# Patient Record
Sex: Male | Born: 1965 | Race: Black or African American | Hispanic: No | Marital: Single | Smoking: Never smoker
Health system: Southern US, Community
[De-identification: ages and names within clinical notes are randomized; demographics above are authoritative.]

## PROBLEM LIST (undated history)

## (undated) DIAGNOSIS — K2970 Gastritis, unspecified, without bleeding: Secondary | ICD-10-CM

## (undated) DIAGNOSIS — E119 Type 2 diabetes mellitus without complications: Secondary | ICD-10-CM

## (undated) DIAGNOSIS — R112 Nausea with vomiting, unspecified: Secondary | ICD-10-CM

## (undated) DIAGNOSIS — K922 Gastrointestinal hemorrhage, unspecified: Secondary | ICD-10-CM

## (undated) DIAGNOSIS — F1290 Cannabis use, unspecified, uncomplicated: Secondary | ICD-10-CM

## (undated) DIAGNOSIS — N179 Acute kidney failure, unspecified: Secondary | ICD-10-CM

## (undated) DIAGNOSIS — E876 Hypokalemia: Secondary | ICD-10-CM

## (undated) DIAGNOSIS — S1121XA Laceration without foreign body of pharynx and cervical esophagus, initial encounter: Secondary | ICD-10-CM

## (undated) DIAGNOSIS — D500 Iron deficiency anemia secondary to blood loss (chronic): Secondary | ICD-10-CM

## (undated) DIAGNOSIS — K31A0 Gastric intestinal metaplasia, unspecified: Secondary | ICD-10-CM

## (undated) DIAGNOSIS — K259 Gastric ulcer, unspecified as acute or chronic, without hemorrhage or perforation: Secondary | ICD-10-CM

## (undated) DIAGNOSIS — K3184 Gastroparesis: Secondary | ICD-10-CM

## (undated) DIAGNOSIS — F12988 Cannabis use, unspecified with other cannabis-induced disorder: Secondary | ICD-10-CM

## (undated) DIAGNOSIS — A048 Other specified bacterial intestinal infections: Secondary | ICD-10-CM

## (undated) DIAGNOSIS — K3189 Other diseases of stomach and duodenum: Secondary | ICD-10-CM

## (undated) DIAGNOSIS — E440 Moderate protein-calorie malnutrition: Secondary | ICD-10-CM

## (undated) DIAGNOSIS — Z5189 Encounter for other specified aftercare: Secondary | ICD-10-CM

## (undated) HISTORY — DX: Cannabis use, unspecified with other cannabis-induced disorder: F12.988

## (undated) HISTORY — DX: Gastric intestinal metaplasia, unspecified: K31.A0

## (undated) HISTORY — DX: Gastritis, unspecified, without bleeding: K29.70

## (undated) HISTORY — DX: Gastrointestinal hemorrhage, unspecified: K92.2

## (undated) HISTORY — DX: Encounter for other specified aftercare: Z51.89

## (undated) HISTORY — DX: Other specified bacterial intestinal infections: A04.8

## (undated) HISTORY — DX: Nausea with vomiting, unspecified: R11.2

## (undated) HISTORY — DX: Gastric ulcer, unspecified as acute or chronic, without hemorrhage or perforation: K25.9

## (undated) HISTORY — DX: Iron deficiency anemia secondary to blood loss (chronic): D50.0

## (undated) HISTORY — DX: Other diseases of stomach and duodenum: K31.89

## (undated) HISTORY — DX: Moderate protein-calorie malnutrition: E44.0

## (undated) HISTORY — DX: Hypokalemia: E87.6

## (undated) HISTORY — DX: Cannabis use, unspecified, uncomplicated: F12.90

---

## 2007-05-02 ENCOUNTER — Emergency Department (HOSPITAL_COMMUNITY): Admission: EM | Admit: 2007-05-02 | Discharge: 2007-05-02 | Payer: Self-pay | Admitting: Emergency Medicine

## 2008-12-19 ENCOUNTER — Emergency Department (HOSPITAL_COMMUNITY): Admission: EM | Admit: 2008-12-19 | Discharge: 2008-12-19 | Payer: Self-pay | Admitting: Emergency Medicine

## 2009-03-28 ENCOUNTER — Emergency Department (HOSPITAL_COMMUNITY): Admission: EM | Admit: 2009-03-28 | Discharge: 2009-03-28 | Payer: Self-pay | Admitting: Emergency Medicine

## 2009-09-18 ENCOUNTER — Emergency Department (HOSPITAL_COMMUNITY): Admission: EM | Admit: 2009-09-18 | Discharge: 2009-09-18 | Payer: Self-pay | Admitting: Emergency Medicine

## 2009-11-25 ENCOUNTER — Emergency Department (HOSPITAL_COMMUNITY): Admission: EM | Admit: 2009-11-25 | Discharge: 2009-11-26 | Payer: Self-pay | Admitting: Emergency Medicine

## 2010-08-22 ENCOUNTER — Emergency Department (HOSPITAL_COMMUNITY)
Admission: EM | Admit: 2010-08-22 | Discharge: 2010-08-22 | Payer: Self-pay | Source: Home / Self Care | Admitting: Emergency Medicine

## 2010-11-22 LAB — COMPREHENSIVE METABOLIC PANEL
Alkaline Phosphatase: 59 U/L (ref 39–117)
BUN: 7 mg/dL (ref 6–23)
Calcium: 9.2 mg/dL (ref 8.4–10.5)
Chloride: 106 mEq/L (ref 96–112)
Creatinine, Ser: 0.72 mg/dL (ref 0.4–1.5)
GFR calc Af Amer: >60 mL/min (ref >60)
GFR calc non Af Amer: >60 mL/min (ref >60)
Glucose, Bld: 119 mg/dL — ABNORMAL HIGH (ref 70–99)
Potassium: 3.7 mEq/L (ref 3.5–5.1)
Total Bilirubin: 0.4 mg/dL (ref 0.3–1.2)
Total Protein: 5.7 g/dL — ABNORMAL LOW (ref 6.0–8.3)

## 2010-11-22 LAB — LIPASE, BLOOD: Lipase: 17 U/L (ref 11–59)

## 2010-11-22 LAB — DIFFERENTIAL
Basophils Absolute: 0.1 10*3/uL (ref 0.0–0.1)
Basophils Relative: 1 % (ref 0–1)
Eosinophils Relative: 0 % (ref 0–5)
Neutro Abs: 7.4 10*3/uL (ref 1.7–7.7)
Neutrophils Relative %: 79 % — ABNORMAL HIGH (ref 43–77)

## 2010-11-22 LAB — CBC
MCV: 95.7 fL (ref 78.0–100.0)
RDW: 13.4 % (ref 11.5–15.5)

## 2010-11-29 ENCOUNTER — Emergency Department (HOSPITAL_COMMUNITY)
Admission: EM | Admit: 2010-11-29 | Discharge: 2010-11-29 | Disposition: A | Discharge status: Home / Self Care | Payer: BC Managed Care – PPO | Attending: Emergency Medicine | Admitting: Emergency Medicine

## 2010-11-29 DIAGNOSIS — R197 Diarrhea, unspecified: Secondary | ICD-10-CM | POA: Insufficient documentation

## 2010-11-29 DIAGNOSIS — R1110 Vomiting, unspecified: Secondary | ICD-10-CM | POA: Insufficient documentation

## 2010-11-29 DIAGNOSIS — R109 Unspecified abdominal pain: Secondary | ICD-10-CM | POA: Insufficient documentation

## 2010-11-29 DIAGNOSIS — K5289 Other specified noninfective gastroenteritis and colitis: Secondary | ICD-10-CM | POA: Insufficient documentation

## 2010-11-29 LAB — DIFFERENTIAL
Basophils Absolute: 0 10*3/uL (ref 0.0–0.1)
Lymphs Abs: 1.3 10*3/uL (ref 0.7–4.0)
Monocytes Relative: 4 % (ref 3–12)
Neutro Abs: 8 10*3/uL — ABNORMAL HIGH (ref 1.7–7.7)
Neutrophils Relative %: 82 % — ABNORMAL HIGH (ref 43–77)

## 2010-11-29 LAB — COMPREHENSIVE METABOLIC PANEL
ALT: 31 U/L (ref 0–53)
AST: 19 U/L (ref 0–37)
Albumin: 4.3 g/dL (ref 3.5–5.2)
Alkaline Phosphatase: 66 U/L (ref 39–117)
CO2: 20 mEq/L (ref 19–32)
Creatinine, Ser: 1.27 mg/dL (ref 0.4–1.5)
GFR calc Af Amer: >60 mL/min (ref >60)
GFR calc non Af Amer: >60 mL/min (ref >60)
Potassium: 3 mEq/L — ABNORMAL LOW (ref 3.5–5.1)
Total Bilirubin: 1.1 mg/dL (ref 0.3–1.2)
Total Protein: 8.1 g/dL (ref 6.0–8.3)

## 2010-11-29 LAB — POCT I-STAT, CHEM 8
BUN: 13 mg/dL (ref 6–23)
Calcium, Ion: 1.08 mmol/L — ABNORMAL LOW (ref 1.12–1.32)
Creatinine, Ser: 0.7 mg/dL (ref 0.4–1.5)
Glucose, Bld: 143 mg/dL — ABNORMAL HIGH (ref 70–99)
HCT: 44 % (ref 39.0–52.0)
Sodium: 142 mEq/L (ref 135–145)
TCO2: 21 mmol/L (ref 0–100)

## 2010-11-29 LAB — CBC
Hemoglobin: 14.2 g/dL (ref 13.0–17.0)
MCV: 94.7 fL (ref 78.0–100.0)
RBC: 4.55 MIL/uL (ref 4.22–5.81)
WBC: 9.7 10*3/uL (ref 4.0–10.5)

## 2010-12-09 ENCOUNTER — Emergency Department (HOSPITAL_COMMUNITY)
Admission: EM | Admit: 2010-12-09 | Discharge: 2010-12-09 | Disposition: A | Discharge status: Home / Self Care | Payer: BC Managed Care – PPO | Attending: Emergency Medicine | Admitting: Emergency Medicine

## 2010-12-09 DIAGNOSIS — R112 Nausea with vomiting, unspecified: Secondary | ICD-10-CM | POA: Insufficient documentation

## 2010-12-09 DIAGNOSIS — R1013 Epigastric pain: Secondary | ICD-10-CM | POA: Insufficient documentation

## 2010-12-09 LAB — CBC
HCT: 37.5 % — ABNORMAL LOW (ref 39.0–52.0)
RBC: 4.33 MIL/uL (ref 4.22–5.81)
RDW: 12 % (ref 11.5–15.5)
WBC: 7.6 10*3/uL (ref 4.0–10.5)

## 2010-12-09 LAB — DIFFERENTIAL
Basophils Absolute: 0 10*3/uL (ref 0.0–0.1)
Basophils Relative: 0 % (ref 0–1)
Eosinophils Absolute: 0 10*3/uL (ref 0.0–0.7)
Eosinophils Relative: 0 % (ref 0–5)
Lymphocytes Relative: 16 % (ref 12–46)
Lymphs Abs: 1.3 10*3/uL (ref 0.7–4.0)
Monocytes Absolute: 0.4 10*3/uL (ref 0.1–1.0)
Monocytes Relative: 5 % (ref 3–12)

## 2010-12-09 LAB — COMPREHENSIVE METABOLIC PANEL
AST: 26 U/L (ref 0–37)
Albumin: 4.5 g/dL (ref 3.5–5.2)
Alkaline Phosphatase: 59 U/L (ref 39–117)
CO2: 22 mEq/L (ref 19–32)
Calcium: 9 mg/dL (ref 8.4–10.5)
Chloride: 106 mEq/L (ref 96–112)
Glucose, Bld: 128 mg/dL — ABNORMAL HIGH (ref 70–99)
Potassium: 3.5 mEq/L (ref 3.5–5.1)
Total Bilirubin: 1 mg/dL (ref 0.3–1.2)

## 2010-12-13 LAB — CBC: MCV: 94.4 fL (ref 78.0–100.0)

## 2010-12-13 LAB — URINALYSIS, ROUTINE W REFLEX MICROSCOPIC
Bilirubin Urine: NEGATIVE
Glucose, UA: NEGATIVE mg/dL
Ketones, ur: 40 mg/dL — AB
Protein, ur: NEGATIVE mg/dL

## 2010-12-13 LAB — COMPREHENSIVE METABOLIC PANEL
AST: 31 U/L (ref 0–37)
Albumin: 4.9 g/dL (ref 3.5–5.2)
Calcium: 9.8 mg/dL (ref 8.4–10.5)
GFR calc Af Amer: >60 mL/min (ref >60)
Potassium: 3.5 mEq/L (ref 3.5–5.1)
Sodium: 141 mEq/L (ref 135–145)
Total Bilirubin: 0.8 mg/dL (ref 0.3–1.2)
Total Protein: 8.8 g/dL — ABNORMAL HIGH (ref 6.0–8.3)

## 2010-12-13 LAB — DIFFERENTIAL
Basophils Absolute: 0 10*3/uL (ref 0.0–0.1)
Basophils Relative: 0 % (ref 0–1)
Lymphocytes Relative: 16 % (ref 12–46)
Lymphs Abs: 1.7 10*3/uL (ref 0.7–4.0)
Neutro Abs: 8.4 10*3/uL — ABNORMAL HIGH (ref 1.7–7.7)
Neutrophils Relative %: 79 % — ABNORMAL HIGH (ref 43–77)

## 2010-12-13 LAB — LACTIC ACID, PLASMA: Lactic Acid, Venous: 2.2 mmol/L (ref 0.5–2.2)

## 2010-12-16 LAB — COMPREHENSIVE METABOLIC PANEL
ALT: 18 U/L (ref 0–53)
AST: 26 U/L (ref 0–37)
CO2: 24 mEq/L (ref 19–32)
Calcium: 9.6 mg/dL (ref 8.4–10.5)
Chloride: 105 mEq/L (ref 96–112)
GFR calc non Af Amer: >60 mL/min (ref >60)
Glucose, Bld: 144 mg/dL — ABNORMAL HIGH (ref 70–99)
Sodium: 139 mEq/L (ref 135–145)
Total Bilirubin: 0.6 mg/dL (ref 0.3–1.2)

## 2010-12-16 LAB — LIPASE, BLOOD: Lipase: 15 U/L (ref 11–59)

## 2011-02-20 ENCOUNTER — Emergency Department (HOSPITAL_COMMUNITY)
Admission: EM | Admit: 2011-02-20 | Discharge: 2011-02-20 | Disposition: A | Discharge status: Home / Self Care | Payer: BC Managed Care – PPO | Attending: Emergency Medicine | Admitting: Emergency Medicine

## 2011-02-20 DIAGNOSIS — R1013 Epigastric pain: Secondary | ICD-10-CM | POA: Insufficient documentation

## 2011-02-20 DIAGNOSIS — R112 Nausea with vomiting, unspecified: Secondary | ICD-10-CM | POA: Insufficient documentation

## 2011-02-20 DIAGNOSIS — R630 Anorexia: Secondary | ICD-10-CM | POA: Insufficient documentation

## 2011-02-20 LAB — POCT I-STAT, CHEM 8
BUN: 12 mg/dL (ref 6–23)
Calcium, Ion: 0.77 mmol/L — ABNORMAL LOW (ref 1.12–1.32)
Chloride: 112 mEq/L (ref 96–112)
Creatinine, Ser: 0.6 mg/dL (ref 0.50–1.35)
TCO2: 17 mmol/L (ref 0–100)

## 2011-02-20 LAB — URINALYSIS, ROUTINE W REFLEX MICROSCOPIC
Glucose, UA: NEGATIVE mg/dL
Ketones, ur: NEGATIVE mg/dL
Leukocytes, UA: NEGATIVE
pH: 5.5 (ref 5.0–8.0)

## 2011-02-20 LAB — URINE MICROSCOPIC-ADD ON

## 2011-02-20 LAB — DIFFERENTIAL
Basophils Absolute: 0 10*3/uL (ref 0.0–0.1)
Basophils Relative: 0 % (ref 0–1)
Eosinophils Absolute: 0 10*3/uL (ref 0.0–0.7)
Eosinophils Relative: 0 % (ref 0–5)
Lymphocytes Relative: 16 % (ref 12–46)
Monocytes Absolute: 0.5 10*3/uL (ref 0.1–1.0)

## 2011-02-20 LAB — COMPREHENSIVE METABOLIC PANEL
ALT: 35 U/L (ref 0–53)
AST: 27 U/L (ref 0–37)
Albumin: 4.9 g/dL (ref 3.5–5.2)
Alkaline Phosphatase: 72 U/L (ref 39–117)
BUN: 12 mg/dL (ref 6–23)
Chloride: 98 mEq/L (ref 96–112)
Potassium: 6.2 mEq/L — ABNORMAL HIGH (ref 3.5–5.1)
Sodium: 134 mEq/L — ABNORMAL LOW (ref 135–145)
Total Bilirubin: 0.3 mg/dL (ref 0.3–1.2)
Total Protein: 9.1 g/dL — ABNORMAL HIGH (ref 6.0–8.3)

## 2011-02-20 LAB — CBC
HCT: 39.5 % (ref 39.0–52.0)
MCHC: 35.9 g/dL (ref 30.0–36.0)
Platelets: 266 10*3/uL (ref 150–400)
RDW: 13 % (ref 11.5–15.5)
WBC: 11.9 10*3/uL — ABNORMAL HIGH (ref 4.0–10.5)

## 2011-02-20 LAB — LIPASE, BLOOD: Lipase: 18 U/L (ref 11–59)

## 2011-06-18 LAB — COMPREHENSIVE METABOLIC PANEL WITH GFR
ALT: 25
AST: 31
Albumin: 4.9
CO2: 25
Chloride: 101
GFR calc Af Amer: >60
GFR calc non Af Amer: >60
Potassium: 4.6
Sodium: 137
Total Bilirubin: 1.4 — ABNORMAL HIGH

## 2011-06-18 LAB — DIFFERENTIAL
Basophils Absolute: 0
Basophils Relative: 0
Eosinophils Absolute: 0
Eosinophils Relative: 0
Lymphocytes Relative: 14
Lymphs Abs: 1.3
Monocytes Absolute: 0.5
Monocytes Relative: 5
Neutro Abs: 7.2
Neutrophils Relative %: 81 — ABNORMAL HIGH

## 2011-06-18 LAB — URINALYSIS, ROUTINE W REFLEX MICROSCOPIC
Bilirubin Urine: NEGATIVE
Glucose, UA: NEGATIVE
Hgb urine dipstick: NEGATIVE
Ketones, ur: 15 — AB
Nitrite: NEGATIVE
Protein, ur: 30 — AB
Specific Gravity, Urine: 1.028
Urobilinogen, UA: 0.2
pH: 5.5

## 2011-06-18 LAB — URINE MICROSCOPIC-ADD ON

## 2011-06-18 LAB — CBC
HCT: 45.1
Hemoglobin: 15.3
MCHC: 33.9
MCV: 95.9
Platelets: 261
RBC: 4.7
RDW: 12.6
WBC: 8.9

## 2011-06-18 LAB — COMPREHENSIVE METABOLIC PANEL
Alkaline Phosphatase: 66
BUN: 31 — ABNORMAL HIGH
Calcium: 9.4
Creatinine, Ser: 1.06
Glucose, Bld: 112 — ABNORMAL HIGH
Total Protein: 8.5 — ABNORMAL HIGH

## 2011-06-18 LAB — LIPASE, BLOOD: Lipase: 16

## 2011-07-06 ENCOUNTER — Emergency Department (HOSPITAL_COMMUNITY)
Admission: EM | Admit: 2011-07-06 | Discharge: 2011-07-06 | Disposition: A | Discharge status: Home / Self Care | Payer: Self-pay | Attending: Emergency Medicine | Admitting: Emergency Medicine

## 2011-07-06 DIAGNOSIS — R630 Anorexia: Secondary | ICD-10-CM | POA: Insufficient documentation

## 2011-07-06 DIAGNOSIS — R112 Nausea with vomiting, unspecified: Secondary | ICD-10-CM | POA: Insufficient documentation

## 2011-07-06 LAB — DIFFERENTIAL
Basophils Relative: 0 % (ref 0–1)
Monocytes Relative: 6 % (ref 3–12)
Neutro Abs: 6.2 10*3/uL (ref 1.7–7.7)
Neutrophils Relative %: 74 % (ref 43–77)

## 2011-07-06 LAB — COMPREHENSIVE METABOLIC PANEL
ALT: 12 U/L (ref 0–53)
Alkaline Phosphatase: 78 U/L (ref 39–117)
CO2: 22 mEq/L (ref 19–32)
Calcium: 10.2 mg/dL (ref 8.4–10.5)
GFR calc Af Amer: >90 mL/min (ref >90)
GFR calc non Af Amer: >90 mL/min (ref >90)
Glucose, Bld: 106 mg/dL — ABNORMAL HIGH (ref 70–99)
Potassium: 3.7 mEq/L (ref 3.5–5.1)
Sodium: 135 mEq/L (ref 135–145)

## 2011-07-06 LAB — CBC
Hemoglobin: 13.3 g/dL (ref 13.0–17.0)
MCH: 29.4 pg (ref 26.0–34.0)
RBC: 4.53 MIL/uL (ref 4.22–5.81)

## 2011-07-07 ENCOUNTER — Emergency Department (HOSPITAL_COMMUNITY)
Admission: EM | Admit: 2011-07-07 | Discharge: 2011-07-07 | Disposition: A | Discharge status: Home / Self Care | Payer: Self-pay | Attending: Emergency Medicine | Admitting: Emergency Medicine

## 2011-07-07 DIAGNOSIS — R10819 Abdominal tenderness, unspecified site: Secondary | ICD-10-CM | POA: Insufficient documentation

## 2011-07-07 DIAGNOSIS — R109 Unspecified abdominal pain: Secondary | ICD-10-CM | POA: Insufficient documentation

## 2011-07-07 DIAGNOSIS — E860 Dehydration: Secondary | ICD-10-CM | POA: Insufficient documentation

## 2011-07-07 DIAGNOSIS — R197 Diarrhea, unspecified: Secondary | ICD-10-CM | POA: Insufficient documentation

## 2011-07-07 DIAGNOSIS — R112 Nausea with vomiting, unspecified: Secondary | ICD-10-CM | POA: Insufficient documentation

## 2011-07-07 DIAGNOSIS — R5381 Other malaise: Secondary | ICD-10-CM | POA: Insufficient documentation

## 2012-01-20 ENCOUNTER — Emergency Department (HOSPITAL_COMMUNITY)
Admission: EM | Admit: 2012-01-20 | Discharge: 2012-01-20 | Disposition: A | Discharge status: Home / Self Care | Payer: Self-pay | Attending: Emergency Medicine | Admitting: Emergency Medicine

## 2012-01-20 ENCOUNTER — Encounter (HOSPITAL_COMMUNITY): Payer: Self-pay | Admitting: *Deleted

## 2012-01-20 DIAGNOSIS — K529 Noninfective gastroenteritis and colitis, unspecified: Secondary | ICD-10-CM

## 2012-01-20 DIAGNOSIS — K5289 Other specified noninfective gastroenteritis and colitis: Secondary | ICD-10-CM | POA: Insufficient documentation

## 2012-01-20 DIAGNOSIS — J069 Acute upper respiratory infection, unspecified: Secondary | ICD-10-CM | POA: Insufficient documentation

## 2012-01-20 DIAGNOSIS — H9209 Otalgia, unspecified ear: Secondary | ICD-10-CM | POA: Insufficient documentation

## 2012-01-20 LAB — CBC
MCH: 31.3 pg (ref 26.0–34.0)
MCHC: 35.8 g/dL (ref 30.0–36.0)
Platelets: 257 10*3/uL (ref 150–400)
RDW: 13 % (ref 11.5–15.5)

## 2012-01-20 LAB — COMPREHENSIVE METABOLIC PANEL
ALT: 17 U/L (ref 0–53)
Albumin: 5.2 g/dL (ref 3.5–5.2)
Alkaline Phosphatase: 82 U/L (ref 39–117)
BUN: 13 mg/dL (ref 6–23)
Potassium: 3.7 mEq/L (ref 3.5–5.1)
Sodium: 139 mEq/L (ref 135–145)
Total Protein: 9.1 g/dL — ABNORMAL HIGH (ref 6.0–8.3)

## 2012-01-20 LAB — DIFFERENTIAL
Basophils Relative: 0 % (ref 0–1)
Eosinophils Absolute: 0 10*3/uL (ref 0.0–0.7)
Neutrophils Relative %: 85 % — ABNORMAL HIGH (ref 43–77)

## 2012-01-20 MED ORDER — MORPHINE SULFATE 4 MG/ML IJ SOLN
4.0000 mg | Freq: Once | INTRAMUSCULAR | Status: AC
  Administered 2012-01-20: 4 mg via INTRAVENOUS
  Filled 2012-01-20: qty 1

## 2012-01-20 MED ORDER — ONDANSETRON HCL 4 MG PO TABS: 4.0000 mg | ORAL_TABLET | Freq: Four times a day (QID) | ORAL | Status: AC

## 2012-01-20 MED ORDER — DIPHENOXYLATE-ATROPINE 2.5-0.025 MG PO TABS: 1.0000 | ORAL_TABLET | Freq: Four times a day (QID) | ORAL | Status: AC | PRN

## 2012-01-20 MED ORDER — FAMOTIDINE IN NACL 20-0.9 MG/50ML-% IV SOLN
20.0000 mg | Freq: Once | INTRAVENOUS | Status: AC
  Administered 2012-01-20: 20 mg via INTRAVENOUS
  Filled 2012-01-20: qty 50

## 2012-01-20 MED ORDER — ONDANSETRON 4 MG PO TBDP
8.0000 mg | ORAL_TABLET | Freq: Once | ORAL | Status: AC
  Administered 2012-01-20: 8 mg via ORAL
  Filled 2012-01-20: qty 2

## 2012-01-20 MED ORDER — SODIUM CHLORIDE 0.9 % IV SOLN
Freq: Once | INTRAVENOUS | Status: AC
Start: 2012-01-20 — End: 2012-01-20
  Administered 2012-01-20: 1000 mL via INTRAVENOUS

## 2012-01-20 MED ORDER — ONDANSETRON HCL 4 MG/2ML IJ SOLN
4.0000 mg | Freq: Once | INTRAMUSCULAR | Status: AC
  Administered 2012-01-20: 4 mg via INTRAVENOUS
  Filled 2012-01-20: qty 2

## 2012-01-20 NOTE — ED Notes (Signed)
The pts syptoms started at 1530

## 2012-01-20 NOTE — ED Notes (Signed)
zofran given for nausea.  Pt asking for pain meds.

## 2012-01-20 NOTE — Discharge Instructions (Signed)
Mr Savant we gave you her nausea and vomiting today in the ER. I will give you a prescription for the same. I will also give you a  new prescription for diarrhea that is called lomotil.  Take as directed.  Return for uncontrolled nausea and vomiting or diarrhea.  We also gave you a bag of fluid in the ER tonight.   B.R.A.T. Diet Your doctor has recommended the B.R.A.T. diet for you or your child until the condition improves. This is often used to help control diarrhea and vomiting symptoms. If you or your child can tolerate clear liquids, you may have:  Bananas.   Rice.   Applesauce.   Toast (and other simple starches such as crackers, potatoes, noodles).  Be sure to avoid dairy products, meats, and fatty foods until symptoms are better. Fruit juices such as apple, grape, and prune juice can make diarrhea worse. Avoid these. Continue this diet for 2 days or as instructed by your caregiver. Document Released: 08/23/2005 Document Revised: 08/12/2011 Document Reviewed: 02/09/2007 [facility] Patient Information 2012 [place], [place].B.R.A.T. Diet Your doctor has recommended the B.R.A.T. diet for you or your child until the condition improves. This is often used to help control diarrhea and vomiting symptoms. If you or your child can tolerate clear liquids, you may have:  Bananas.   Rice.   Applesauce.   Toast (and other simple starches such as crackers, potatoes, noodles).  Be sure to avoid dairy products, meats, and fatty foods until symptoms are better. Fruit juices such as apple, grape, and prune juice can make diarrhea worse. Avoid these. Continue this diet for 2 days or as instructed by your caregiver. Document Released: 08/23/2005 Document Revised: 08/12/2011 Document Reviewed: 02/09/2007 [facility] Patient Information 2012 [place], [place].B.R.A.T. Diet Your doctor has recommended the B.R.A.T. diet for you or your child until the condition improves. This is often used to help control diarrhea  and vomiting symptoms. If you or your child can tolerate clear liquids, you may have:  Bananas.   Rice.   Applesauce.   Toast (and other simple starches such as crackers, potatoes, noodles).  Be sure to avoid dairy products, meats, and fatty foods until symptoms are better. Fruit juices such as apple, grape, and prune juice can make diarrhea worse. Avoid these. Continue this diet for 2 days or as instructed by your caregiver. Document Released: 08/23/2005 Document Revised: 08/12/2011 Document Reviewed: 02/09/2007 [facility] Patient Information 2012 [place], [place].B.R.A.T. Diet Your doctor has recommended the B.R.A.T. diet for you or your child until the condition improves. This is often used to help control diarrhea and vomiting symptoms. If you or your child can tolerate clear liquids, you may have:  Bananas.   Rice.   Applesauce.   Toast (and other simple starches such as crackers, potatoes, noodles).  Be sure to avoid dairy products, meats, and fatty foods until symptoms are better. Fruit juices such as apple, grape, and prune juice can make diarrhea worse. Avoid these. Continue this diet for 2 days or as instructed by your caregiver. Document Released: 08/23/2005 Document Revised: 08/12/2011 Document Reviewed: 02/09/2007 [facility] Patient Information 2012 [place], [place].

## 2012-01-20 NOTE — ED Notes (Signed)
The pts pain is better 

## 2012-01-20 NOTE — ED Notes (Signed)
The pt has had abd pain nv no diarrhea.  He has had chills and feeling hot  And cold.  He had a n advil one hour ago

## 2012-01-20 NOTE — ED Provider Notes (Signed)
History     CSN: [id number]  Arrival date & time 01/20/12  0402   First MD Initiated Contact with Patient 01/20/12 [phone]      Chief Complaint  Patient presents with  . Abdominal Pain    (Consider location/radiation/quality/duration/timing/severity/associated sxs/prior treatment) Patient is a 46 y.o. male presenting with abdominal pain. The history is provided by the patient. No language interpreter was used.  Abdominal Pain The primary symptoms of the illness include abdominal pain, nausea and vomiting. The primary symptoms of the illness do not include fever, shortness of breath or diarrhea. The current episode started 6 to 12 hours ago. The onset of the illness was gradual. The problem has been gradually worsening.  The patient states that she believes she is currently not pregnant. The patient has not had a change in bowel habit. Additional symptoms associated with the illness include chills. Symptoms associated with the illness do not include anorexia, diaphoresis, constipation, urgency, hematuria, frequency or back pain.   Patient reports that he has  had nausea vomiting and diarrhea since 3 PM today. Diarrhea > 5 times. General abdominal pain especially when vomiting. Denies fever or blood in stool or vomitus.  No sick contacts.  Will rehydrate and check labs.    History reviewed. No pertinent past medical history.  History reviewed. No pertinent past surgical history.  No family history on file.  History  Substance Use Topics  . Smoking status: Never Smoker   . Smokeless tobacco: Not on file  . Alcohol Use: No      Review of Systems  Constitutional: Positive for chills. Negative for fever and diaphoresis.  HENT: Negative.   Eyes: Negative.   Respiratory: Negative.  Negative for shortness of breath.   Cardiovascular: Negative.   Gastrointestinal: Positive for nausea, vomiting and abdominal pain. Negative for diarrhea, constipation and anorexia.  Genitourinary: Negative  for urgency, frequency and hematuria.  Musculoskeletal: Negative for back pain.  Neurological: Negative.   Psychiatric/Behavioral: Negative.   All other systems reviewed and are negative.    Allergies  Review of patient's allergies indicates no known allergies.  Home Medications  No current outpatient prescriptions on file.  BP 154/98  Pulse 65  Temp(Src) 98.1 F (36.7 C) (Oral)  Resp 16  SpO2 100%  Physical Exam  Nursing note and vitals reviewed. Constitutional: He is oriented to person, place, and time. He appears well-developed and well-nourished.  HENT:  Head: Normocephalic.  Eyes: Conjunctivae and EOM are normal. Pupils are equal, round, and reactive to light.  Neck: Normal range of motion. Neck supple.  Cardiovascular: Normal rate.   Pulmonary/Chest: Effort normal and breath sounds normal.  Abdominal: Soft. Bowel sounds are normal. He exhibits no distension and no mass. There is tenderness. There is no rebound and no guarding.  Musculoskeletal: Normal range of motion.  Neurological: He is alert and oriented to person, place, and time.  Skin: Skin is warm and dry.  Psychiatric: He has a normal mood and affect.    ED Course  Procedures (including critical care time)  Labs Reviewed  CBC - Abnormal; Notable for the following:    WBC 13.6 (*)    All other components within normal limits  DIFFERENTIAL - Abnormal; Notable for the following:    Neutrophils Relative 85 (*)    Neutro Abs 11.6 (*)    All other components within normal limits  URINALYSIS, ROUTINE W REFLEX MICROSCOPIC  COMPREHENSIVE METABOLIC PANEL   No results found.   No diagnosis found.  MDM  Gastroenteritis tmt in ER with IVFand zofran and morphine with relief.  Feels better and wants to go home. Rx for zofran and lomotil.  No further vomiting or diarrhea in ER.       Remi Haggard, NP 01/20/12 [phone]

## 2012-01-20 NOTE — ED Notes (Signed)
pepcid infused 1000cc nss added to iv

## 2012-01-21 NOTE — ED Provider Notes (Signed)
Medical screening examination/treatment/procedure(s) were performed by non-physician practitioner and as supervising physician I was immediately available for consultation/collaboration.   Forbes Cellar, MD 01/21/12 [phone]

## 2012-03-11 ENCOUNTER — Encounter (HOSPITAL_COMMUNITY): Payer: Self-pay | Admitting: *Deleted

## 2012-03-11 ENCOUNTER — Emergency Department (HOSPITAL_COMMUNITY): Payer: Self-pay

## 2012-03-11 ENCOUNTER — Emergency Department (HOSPITAL_COMMUNITY)
Admission: EM | Admit: 2012-03-11 | Discharge: 2012-03-11 | Disposition: A | Discharge status: Home / Self Care | Payer: Self-pay | Attending: Emergency Medicine | Admitting: Emergency Medicine

## 2012-03-11 DIAGNOSIS — R197 Diarrhea, unspecified: Secondary | ICD-10-CM | POA: Insufficient documentation

## 2012-03-11 DIAGNOSIS — R109 Unspecified abdominal pain: Secondary | ICD-10-CM

## 2012-03-11 DIAGNOSIS — R1110 Vomiting, unspecified: Secondary | ICD-10-CM

## 2012-03-11 DIAGNOSIS — R1013 Epigastric pain: Secondary | ICD-10-CM | POA: Insufficient documentation

## 2012-03-11 DIAGNOSIS — E860 Dehydration: Secondary | ICD-10-CM | POA: Insufficient documentation

## 2012-03-11 DIAGNOSIS — R112 Nausea with vomiting, unspecified: Secondary | ICD-10-CM | POA: Insufficient documentation

## 2012-03-11 LAB — COMPREHENSIVE METABOLIC PANEL
ALT: 15 U/L (ref 0–53)
AST: 16 U/L (ref 0–37)
Albumin: 4.8 g/dL (ref 3.5–5.2)
Alkaline Phosphatase: 77 U/L (ref 39–117)
Chloride: 99 mEq/L (ref 96–112)
Potassium: 3.6 mEq/L (ref 3.5–5.1)
Sodium: 136 mEq/L (ref 135–145)
Total Bilirubin: 0.4 mg/dL (ref 0.3–1.2)
Total Protein: 9 g/dL — ABNORMAL HIGH (ref 6.0–8.3)

## 2012-03-11 LAB — CBC WITH DIFFERENTIAL/PLATELET
Basophils Absolute: 0 10*3/uL (ref 0.0–0.1)
Basophils Relative: 0 % (ref 0–1)
Lymphocytes Relative: 13 % (ref 12–46)
MCHC: 33.9 g/dL (ref 30.0–36.0)
Neutro Abs: 7.2 10*3/uL (ref 1.7–7.7)
Neutrophils Relative %: 82 % — ABNORMAL HIGH (ref 43–77)
RDW: 12.9 % (ref 11.5–15.5)
WBC: 8.8 10*3/uL (ref 4.0–10.5)

## 2012-03-11 LAB — LIPASE, BLOOD: Lipase: 12 U/L (ref 11–59)

## 2012-03-11 MED ORDER — SODIUM CHLORIDE 0.9 % IV BOLUS (SEPSIS)
1000.0000 mL | Freq: Once | INTRAVENOUS | Status: AC
  Administered 2012-03-11: 1000 mL via INTRAVENOUS

## 2012-03-11 MED ORDER — DICYCLOMINE HCL 10 MG/ML IM SOLN
20.0000 mg | Freq: Once | INTRAMUSCULAR | Status: AC
  Administered 2012-03-11: 20 mg via INTRAMUSCULAR
  Filled 2012-03-11: qty 2

## 2012-03-11 MED ORDER — ONDANSETRON HCL 4 MG/2ML IJ SOLN
4.0000 mg | Freq: Once | INTRAMUSCULAR | Status: AC
  Administered 2012-03-11: 4 mg via INTRAVENOUS
  Filled 2012-03-11: qty 2

## 2012-03-11 MED ORDER — ONDANSETRON 4 MG PO TBDP: 4.0000 mg | ORAL_TABLET | Freq: Three times a day (TID) | ORAL | Status: AC | PRN

## 2012-03-11 MED ORDER — MORPHINE SULFATE 4 MG/ML IJ SOLN
4.0000 mg | Freq: Once | INTRAMUSCULAR | Status: AC
  Administered 2012-03-11: 4 mg via INTRAVENOUS
  Filled 2012-03-11: qty 1

## 2012-03-11 NOTE — ED Notes (Signed)
C/o n/v/d, abd pain & chills since Thursday. Avg diarrhea once daily. C/o upper abd pain especially with vomiting, cramping with diarrhea. Last emesis & diarrhea PTA

## 2012-03-11 NOTE — ED Notes (Signed)
Pt reports n/v/d since Thursday associated with mid upper abdominal pain. Reports sweating episodes.

## 2012-03-11 NOTE — ED Notes (Signed)
Returned from ultrasound.

## 2012-03-11 NOTE — ED Provider Notes (Signed)
History     CSN: [id number]  Arrival date & time 03/11/12  7829   First MD Initiated Contact with Patient 03/11/12 1334      Chief Complaint  Patient presents with  . Abdominal Pain  . Nausea  . Vomiting  . Diarrhea    (Consider location/radiation/quality/duration/timing/severity/associated sxs/prior treatment) HPI  46yoM pw abdominal pain. States that he's had mid upper abdominal pain times 3 days. The pain as a constant. It's been associated with multiple episodes of bilious emesis. He is having diarrhea as well which she states has been improving. No blood in the stool. He rates his discomfort as 3 at 10 at this time. He states he's "sore from vomiting so much". He denies fevers, chills. Denies hematuria/dysuria/freq/urgency.  Seen here for similar in May 2013.  ED Notes, ED Provider Notes from 03/11/12 0000 to 03/11/12 09:54:15       Melissa Loyal Gambler, RN 03/11/2012 09:53      Pt reports n/v/d since Thursday associated with mid upper abdominal pain. Reports sweating episodes.      History reviewed. No pertinent past medical history.  History reviewed. No pertinent past surgical history.  History reviewed. No pertinent family history.  History  Substance Use Topics  . Smoking status: Never Smoker   . Smokeless tobacco: Not on file  . Alcohol Use: No    Review of Systems  All other systems reviewed and are negative.   except as noted HPI  Allergies  Review of patient's allergies indicates no known allergies.  Home Medications   Current Outpatient Rx  Name Route Sig Dispense Refill  . IBUPROFEN 200 MG PO TABS Oral Take 400 mg by mouth every 6 (six) hours as needed. For pain    . ONDANSETRON 4 MG PO TBDP Oral Take 1 tablet (4 mg total) by mouth every 8 (eight) hours as needed for nausea. 20 tablet 0    BP 120/74  Pulse 74  Temp 97.7 F (36.5 C) (Oral)  Resp 18  SpO2 100%  Physical Exam  Nursing note and vitals reviewed. Constitutional: He is  oriented to person, place, and time. He appears well-developed and well-nourished. No distress.  HENT:  Head: Atraumatic.       Mm dry  Eyes: Conjunctivae are normal. Pupils are equal, round, and reactive to light.  Neck: Neck supple.  Cardiovascular: Normal rate, regular rhythm, normal heart sounds and intact distal pulses.  Exam reveals no gallop and no friction rub.   No murmur heard. Pulmonary/Chest: Effort normal. No respiratory distress. He has no wheezes. He has no rales.  Abdominal: Soft. Bowel sounds are normal. There is tenderness. There is no rebound and no guarding.       Min epigastric ttp  Musculoskeletal: Normal range of motion. He exhibits no edema and no tenderness.  Neurological: He is alert and oriented to person, place, and time.  Skin: Skin is warm and dry. No rash noted.  Psychiatric: He has a normal mood and affect.    ED Course  Procedures (including critical care time)  Labs Reviewed  CBC WITH DIFFERENTIAL - Abnormal; Notable for the following:    Neutrophils Relative 82 (*)     All other components within normal limits  COMPREHENSIVE METABOLIC PANEL - Abnormal; Notable for the following:    CO2 18 (*)     Glucose, Bld 107 (*)     BUN 25 (*)     Total Protein 9.0 (*)     All  other components within normal limits  LIPASE, BLOOD  LAB REPORT - SCANNED   US Abdomen Complete  03/11/2012  *RADIOLOGY REPORT*  Clinical Data:  Right upper quadrant pain.  Vomiting.  COMPLETE ABDOMINAL ULTRASOUND  Comparison:  11/26/2009 CT.  Findings:  Gallbladder:  No gallstones, gallbladder wall thickening, or pericholecystic fluid.  Common bile duct:  3 mm, normal.  Liver:  No focal lesion identified.  Within normal limits in parenchymal echogenicity.  IVC:  Appears normal.  Pancreas:  No focal abnormality seen.  Spleen:  44 mm.  Normal echotexture.  Suboptimal visualization due to poor acoustic window.  No splenomegaly.  Right Kidney:  9.9 cm. Normal echotexture.  Normal central  sinus echo complex.  No calculi or hydronephrosis.  Left Kidney:  10.7 cm. Normal echotexture.  Normal central sinus echo complex.  No calculi or hydronephrosis.  Abdominal aorta:  Normal appearance of the abdominal aorta with anatomic tapering.  IMPRESSION: Normal abdominal ultrasound.  Original Report Authenticated By: Andreas Newport, M.D.    1. Abdominal pain   2. Vomiting and diarrhea   3. Dehydration     MDM  Epigastric Abd pain/n/v/d. U/S without acute cholecystitis. Feeling better in ED s/p IVF, zofran, bentyl, morphine. Tolerating PO. No EMC precluding discharge at this time. Given Precautions for return. PMD f/u.         Forbes Cellar, MD 03/12/12 [phone]

## 2012-07-19 ENCOUNTER — Encounter (HOSPITAL_COMMUNITY): Payer: Self-pay | Admitting: Emergency Medicine

## 2012-07-19 ENCOUNTER — Emergency Department (HOSPITAL_COMMUNITY)
Admission: EM | Admit: 2012-07-19 | Discharge: 2012-07-20 | Disposition: A | Discharge status: Home / Self Care | Payer: Self-pay | Attending: Emergency Medicine | Admitting: Emergency Medicine

## 2012-07-19 ENCOUNTER — Emergency Department (HOSPITAL_COMMUNITY): Payer: Self-pay

## 2012-07-19 DIAGNOSIS — R197 Diarrhea, unspecified: Secondary | ICD-10-CM | POA: Insufficient documentation

## 2012-07-19 DIAGNOSIS — R112 Nausea with vomiting, unspecified: Secondary | ICD-10-CM | POA: Insufficient documentation

## 2012-07-19 DIAGNOSIS — R630 Anorexia: Secondary | ICD-10-CM | POA: Insufficient documentation

## 2012-07-19 DIAGNOSIS — K5289 Other specified noninfective gastroenteritis and colitis: Secondary | ICD-10-CM | POA: Insufficient documentation

## 2012-07-19 DIAGNOSIS — K529 Noninfective gastroenteritis and colitis, unspecified: Secondary | ICD-10-CM

## 2012-07-19 LAB — CBC WITH DIFFERENTIAL/PLATELET
Basophils Absolute: 0 10*3/uL (ref 0.0–0.1)
Eosinophils Absolute: 0 10*3/uL (ref 0.0–0.7)
Eosinophils Relative: 0 % (ref 0–5)
HCT: 46.1 % (ref 39.0–52.0)
Lymphocytes Relative: 11 % — ABNORMAL LOW (ref 12–46)
MCH: 31.3 pg (ref 26.0–34.0)
MCHC: 35.6 g/dL (ref 30.0–36.0)
MCV: 88 fL (ref 78.0–100.0)
Monocytes Absolute: 0.8 10*3/uL (ref 0.1–1.0)
RDW: 12.9 % (ref 11.5–15.5)
WBC: 17.7 10*3/uL — ABNORMAL HIGH (ref 4.0–10.5)

## 2012-07-19 LAB — COMPREHENSIVE METABOLIC PANEL
AST: 20 U/L (ref 0–37)
CO2: 20 mEq/L (ref 19–32)
Calcium: 10.6 mg/dL — ABNORMAL HIGH (ref 8.4–10.5)
Creatinine, Ser: 1.23 mg/dL (ref 0.50–1.35)
GFR calc Af Amer: 80 mL/min — ABNORMAL LOW (ref >90)
GFR calc non Af Amer: 69 mL/min — ABNORMAL LOW (ref >90)
Total Protein: 9.9 g/dL — ABNORMAL HIGH (ref 6.0–8.3)

## 2012-07-19 MED ORDER — SODIUM CHLORIDE 0.9 % IV BOLUS (SEPSIS)
1000.0000 mL | Freq: Once | INTRAVENOUS | Status: AC
  Administered 2012-07-19: 1000 mL via INTRAVENOUS

## 2012-07-19 MED ORDER — ONDANSETRON HCL 4 MG/2ML IJ SOLN
INTRAMUSCULAR | Status: AC
  Administered 2012-07-19: 4 mg via INTRAVENOUS
  Filled 2012-07-19: qty 2

## 2012-07-19 MED ORDER — SODIUM CHLORIDE 0.9 % IV SOLN
Freq: Once | INTRAVENOUS | Status: AC
  Administered 2012-07-19: 21:00:00 via INTRAVENOUS

## 2012-07-19 MED ORDER — ONDANSETRON HCL 4 MG/2ML IJ SOLN
4.0000 mg | Freq: Once | INTRAMUSCULAR | Status: AC
  Administered 2012-07-19: 4 mg via INTRAVENOUS

## 2012-07-19 MED ORDER — MORPHINE SULFATE 4 MG/ML IJ SOLN
4.0000 mg | Freq: Once | INTRAMUSCULAR | Status: AC
  Administered 2012-07-19: 4 mg via INTRAVENOUS
  Filled 2012-07-19: qty 1

## 2012-07-19 NOTE — ED Notes (Signed)
Pt. States he has upper abdominal pain that just began today with n/v/d.  Pt. Reports two episodes of diarrhea and four episodes of emesis in last 24hrs. Denies urinary complaints.

## 2012-07-19 NOTE — ED Provider Notes (Signed)
History     CSN: [id number]  Arrival date & time 07/19/12  1932   First MD Initiated Contact with Patient 07/19/12 2101      Chief Complaint  Patient presents with  . Abdominal Pain    (Consider location/radiation/quality/duration/timing/severity/associated sxs/prior treatment) HPI Comments: 46 year old male with no significant past medical history presents emergency department with chief complaint of nausea, vomiting, and diarrhea.  Onset of symptoms began acutely at 8 a.m. this morning started with diarrhea.  Associated symptoms include fatigue, generalized weakness, and abdominal pain.  Patient states that pain is located primarily in the epigastric region and is worsened with vomiting.  Pain rated at 7/10 without radiation.  Patient reports that he has had gastritis in the past that was caused by atypical stomach flu.  He denies any recent steroid use, or other reason to be immunocompromise including HIV.  Patient denies fever, night sweats, chills, urinary symptoms, syncope, headache, neck pain, nuchal rigidity, back pain.  The history is provided by the patient.    History reviewed. No pertinent past medical history.  History reviewed. No pertinent past surgical history.  History reviewed. No pertinent family history.  History  Substance Use Topics  . Smoking status: Never Smoker   . Smokeless tobacco: Not on file  . Alcohol Use: No      Review of Systems  Constitutional: Positive for appetite change. Negative for fever and chills.  HENT: Negative for neck stiffness and dental problem.   Eyes: Negative for visual disturbance.  Respiratory: Negative for cough, chest tightness, shortness of breath and wheezing.   Cardiovascular: Negative for chest pain.  Gastrointestinal: Positive for nausea, vomiting, abdominal pain and diarrhea. Negative for constipation, blood in stool, abdominal distention, anal bleeding and rectal pain.  Genitourinary: Negative for dysuria,  urgency, hematuria and flank pain.  Musculoskeletal: Negative for myalgias and arthralgias.  Skin: Negative for rash.  Neurological: Negative for dizziness, syncope, speech difficulty, numbness and headaches.  Hematological: Does not bruise/bleed easily.  All other systems reviewed and are negative.    Allergies  Review of patient's allergies indicates no known allergies.  Home Medications   Current Outpatient Rx  Name  Route  Sig  Dispense  Refill  . IBUPROFEN 200 MG PO TABS   Oral   Take 400 mg by mouth every 6 (six) hours as needed. For pain           BP 131/84  Pulse 84  Temp 97.8 F (36.6 C) (Oral)  Resp 24  SpO2 98%  Physical Exam  Nursing note and vitals reviewed. Constitutional: Vital signs are normal. He appears well-developed and well-nourished. He appears distressed.       hypertensive  HENT:  Head: Normocephalic and atraumatic.  Mouth/Throat: Uvula is midline and mucous membranes are normal.       MM dry  Eyes: Conjunctivae normal and EOM are normal. Pupils are equal, round, and reactive to light.  Neck: Normal range of motion and full passive range of motion without pain. Neck supple. No spinous process tenderness and no muscular tenderness present. No rigidity. No Brudzinski's sign noted.  Cardiovascular: Normal rate and regular rhythm.   Pulmonary/Chest: Effort normal and breath sounds normal. No accessory muscle usage. Not tachypneic. No respiratory distress.  Abdominal: Soft. Normal appearance. He exhibits no distension, no ascites, no pulsatile midline mass and no mass. There is tenderness (mild epigastric). There is no CVA tenderness. No hernia.  Lymphadenopathy:    He has no cervical adenopathy.  Neurological: He is alert.  Skin: Skin is warm and dry. No rash noted. He is not diaphoretic.  Psychiatric: He has a normal mood and affect. His speech is normal and behavior is normal.    ED Course  Procedures (including critical care time)  Labs  Reviewed  CBC WITH DIFFERENTIAL - Abnormal; Notable for the following:    WBC 17.7 (*)     Neutrophils Relative 85 (*)     Neutro Abs 15.0 (*)     Lymphocytes Relative 11 (*)     All other components within normal limits  COMPREHENSIVE METABOLIC PANEL - Abnormal; Notable for the following:    Glucose, Bld 173 (*)     Calcium 10.6 (*)     Total Protein 9.9 (*)     Albumin 5.4 (*)     GFR calc non Af Amer 69 (*)     GFR calc Af Amer 80 (*)     All other components within normal limits  LIPASE, BLOOD  URINALYSIS, ROUTINE W REFLEX MICROSCOPIC   US Abdomen Complete  07/19/2012  *RADIOLOGY REPORT*  Clinical Data:  Epigastric pain.  COMPLETE ABDOMINAL ULTRASOUND  Comparison:  03/11/2012  Findings:  Gallbladder:  No gallstones, gallbladder wall thickening, or pericholecystic fluid.  Common bile duct:   Within normal limits in caliber.  Liver:  No focal lesion identified.  Within normal limits in parenchymal echogenicity.  IVC:  Appears normal.  Pancreas:  No focal abnormality seen.  Spleen:  Not well visualized due to overlying bowel gas.  Right Kidney:   Normal in size and parenchymal echogenicity.  No evidence of mass or hydronephrosis.  Left Kidney:  8.0 cm.  No hydronephrosis.  Poorly visualized due to overlying bowel gas.  Abdominal aorta:  No aneurysm identified.  IMPRESSION: No acute findings.  Left abdominal structures difficult to visualize due to overlying bowel gas.   Original Report Authenticated By: Charlett Nose, M.D.      No diagnosis found.    MDM  Gastroenteritis  Patient with symptoms consistent with viral gastroenteritis.  Vitals are stable, no fever.  No signs of dehydration, tolerating PO fluids > 6 oz.  Lungs are clear.  No focal abdominal pain, no concern for appendicitis, cholecystitis, pancreatitis, ruptured viscus, UTI, kidney stone, or any other abdominal etiology.  Supportive therapy indicated with return if symptoms worsen.  Patient counseled.         Jaci Carrel, PA-C 07/20/12 0000

## 2012-07-19 NOTE — ED Notes (Signed)
Pt to US.

## 2012-07-20 MED ORDER — HYDROCODONE-ACETAMINOPHEN 5-325 MG PO TABS: 1.0000 | ORAL_TABLET | Freq: Four times a day (QID) | ORAL | Status: DC | PRN

## 2012-07-20 MED ORDER — ONDANSETRON HCL 4 MG PO TABS: 4.0000 mg | ORAL_TABLET | Freq: Four times a day (QID) | ORAL | Status: DC | PRN

## 2012-07-20 NOTE — ED Provider Notes (Signed)
Medical screening examination/treatment/procedure(s) were performed by non-physician practitioner and as supervising physician I was immediately available for consultation/collaboration.   Richardean Canal, MD 07/20/12 [phone]

## 2013-01-22 ENCOUNTER — Emergency Department (HOSPITAL_COMMUNITY)
Admission: EM | Admit: 2013-01-22 | Discharge: 2013-01-22 | Disposition: A | Discharge status: Home / Self Care | Payer: Self-pay | Attending: Emergency Medicine | Admitting: Emergency Medicine

## 2013-01-22 ENCOUNTER — Encounter (HOSPITAL_COMMUNITY): Payer: Self-pay

## 2013-01-22 ENCOUNTER — Emergency Department (HOSPITAL_COMMUNITY): Payer: Self-pay

## 2013-01-22 DIAGNOSIS — R6883 Chills (without fever): Secondary | ICD-10-CM | POA: Insufficient documentation

## 2013-01-22 DIAGNOSIS — R197 Diarrhea, unspecified: Secondary | ICD-10-CM | POA: Insufficient documentation

## 2013-01-22 DIAGNOSIS — R42 Dizziness and giddiness: Secondary | ICD-10-CM | POA: Insufficient documentation

## 2013-01-22 DIAGNOSIS — R112 Nausea with vomiting, unspecified: Secondary | ICD-10-CM | POA: Insufficient documentation

## 2013-01-22 DIAGNOSIS — R109 Unspecified abdominal pain: Secondary | ICD-10-CM | POA: Insufficient documentation

## 2013-01-22 LAB — COMPREHENSIVE METABOLIC PANEL
Albumin: 4.9 g/dL (ref 3.5–5.2)
Alkaline Phosphatase: 85 U/L (ref 39–117)
BUN: 13 mg/dL (ref 6–23)
CO2: 20 mEq/L (ref 19–32)
Chloride: 104 mEq/L (ref 96–112)
Potassium: 4 mEq/L (ref 3.5–5.1)
Total Bilirubin: 0.4 mg/dL (ref 0.3–1.2)

## 2013-01-22 LAB — URINE MICROSCOPIC-ADD ON

## 2013-01-22 LAB — CBC WITH DIFFERENTIAL/PLATELET
Basophils Relative: 0 % (ref 0–1)
Hemoglobin: 14.4 g/dL (ref 13.0–17.0)
Lymphocytes Relative: 13 % (ref 12–46)
Lymphs Abs: 1.6 10*3/uL (ref 0.7–4.0)
MCHC: 35.3 g/dL (ref 30.0–36.0)
Monocytes Relative: 4 % (ref 3–12)
Neutro Abs: 9.8 10*3/uL — ABNORMAL HIGH (ref 1.7–7.7)
Neutrophils Relative %: 83 % — ABNORMAL HIGH (ref 43–77)
RBC: 4.72 MIL/uL (ref 4.22–5.81)
WBC: 11.9 10*3/uL — ABNORMAL HIGH (ref 4.0–10.5)

## 2013-01-22 LAB — URINALYSIS, ROUTINE W REFLEX MICROSCOPIC
Bilirubin Urine: NEGATIVE
Glucose, UA: NEGATIVE mg/dL
Ketones, ur: 15 mg/dL — AB
pH: 5.5 (ref 5.0–8.0)

## 2013-01-22 LAB — LIPASE, BLOOD: Lipase: 13 U/L (ref 11–59)

## 2013-01-22 MED ORDER — SODIUM CHLORIDE 0.9 % IV BOLUS (SEPSIS)
500.0000 mL | Freq: Once | INTRAVENOUS | Status: AC
  Administered 2013-01-22: 500 mL via INTRAVENOUS

## 2013-01-22 MED ORDER — ONDANSETRON HCL 4 MG/2ML IJ SOLN
4.0000 mg | Freq: Once | INTRAMUSCULAR | Status: AC
  Administered 2013-01-22: 4 mg via INTRAVENOUS
  Filled 2013-01-22: qty 2

## 2013-01-22 MED ORDER — SODIUM CHLORIDE 0.9 % IJ SOLN
10.0000 mL | INTRAMUSCULAR | Status: DC | PRN
  Administered 2013-01-22: 10 mL via INTRAVENOUS

## 2013-01-22 MED ORDER — KETOROLAC TROMETHAMINE 30 MG/ML IJ SOLN
30.0000 mg | Freq: Once | INTRAMUSCULAR | Status: AC
  Administered 2013-01-22: 30 mg via INTRAVENOUS
  Filled 2013-01-22: qty 1

## 2013-01-22 MED ORDER — ONDANSETRON HCL 4 MG PO TABS: 4.0000 mg | ORAL_TABLET | Freq: Four times a day (QID) | ORAL | Status: DC

## 2013-01-22 NOTE — ED Notes (Signed)
Pt reports having acute onset of nausea vomiting with one episode of diarrhea and generalized abdominal pain since yesterday afternoon. Pt states last episode of vomiting pta "dark green" in color.

## 2013-01-22 NOTE — ED Provider Notes (Signed)
History     CSN: [id number]  Arrival date & time 01/22/13  0440   First MD Initiated Contact with Patient 01/22/13 [phone]      Chief Complaint  Patient presents with  . Emesis  . Abdominal Pain    (Consider location/radiation/quality/duration/timing/severity/associated sxs/prior treatment) HPI  Pt is a 47 yo M presenting for acute onset nausea, non-bloody vomiting, and one episode of non-bloody diarrhea that began yesterday afternoon. Patient states had one episode of vomiting with dark green. Since his been unable to tolerate any by mouth intake. He said some associated generalized upper abdominal pain brought on by dry heaving or vomiting that is crampy in nature and chills. No alleviating or aggravating factors. Denies sick contacts or suspected contaminated food source. Denies fever, urinary symptoms, CP, SOB.   History reviewed. No pertinent past medical history.  History reviewed. No pertinent past surgical history.  No family history on file.  History  Substance Use Topics  . Smoking status: Never Smoker   . Smokeless tobacco: Not on file  . Alcohol Use: No      Review of Systems  Constitutional: Positive for chills. Negative for fever.  HENT: Negative.   Eyes: Negative for visual disturbance.  Respiratory: Negative.  Negative for shortness of breath.   Cardiovascular: Negative.  Negative for chest pain.  Gastrointestinal: Positive for nausea, vomiting, abdominal pain and diarrhea. Negative for blood in stool.  Genitourinary: Negative for dysuria, urgency, frequency, flank pain, discharge, difficulty urinating and penile pain.  Musculoskeletal: Negative for back pain.  Skin: Negative.   Neurological: Positive for light-headedness. Negative for syncope and weakness.    Allergies  Review of patient's allergies indicates no known allergies.  Home Medications   Current Outpatient Rx  Name  Route  Sig  Dispense  Refill  . ondansetron (ZOFRAN) 4 MG tablet    Oral   Take 4 mg by mouth every 6 (six) hours as needed for nausea.         . ondansetron (ZOFRAN) 4 MG tablet   Oral   Take 1 tablet (4 mg total) by mouth every 6 (six) hours.   12 tablet   0     BP 141/91  Pulse 69  Temp(Src) 97.9 F (36.6 C) (Oral)  Resp 22  SpO2 99%  Physical Exam  Constitutional: He is oriented to person, place, and time. He appears well-developed and well-nourished. No distress.  HENT:  Head: Normocephalic and atraumatic.  Mouth/Throat: Oropharynx is clear and moist.  Eyes: EOM are normal. Pupils are equal, round, and reactive to light.  Neck: Neck supple.  Cardiovascular: Normal rate, regular rhythm and normal heart sounds.   Pulmonary/Chest: Effort normal and breath sounds normal.  Abdominal: Soft. Bowel sounds are normal. He exhibits no distension. There is no tenderness. There is no rebound, no guarding and no CVA tenderness.  Neurological: He is alert and oriented to person, place, and time.  Skin: Skin is warm and dry. He is not diaphoretic.  Psychiatric: He has a normal mood and affect.    ED Course  Procedures (including critical care time)  Labs Reviewed  CBC WITH DIFFERENTIAL - Abnormal; Notable for the following:    WBC 11.9 (*)    Neutrophils Relative % 83 (*)    Neutro Abs 9.8 (*)    All other components within normal limits  COMPREHENSIVE METABOLIC PANEL - Abnormal; Notable for the following:    Glucose, Bld 157 (*)    Total Protein 9.0 (*)  All other components within normal limits  URINALYSIS, ROUTINE W REFLEX MICROSCOPIC - Abnormal; Notable for the following:    Specific Gravity, Urine 1.033 (*)    Hgb urine dipstick SMALL (*)    Ketones, ur 15 (*)    Protein, ur 30 (*)    All other components within normal limits  URINE MICROSCOPIC-ADD ON - Abnormal; Notable for the following:    Squamous Epithelial / LPF FEW (*)    All other components within normal limits  LIPASE, BLOOD   Patient's previous urinalysis shows  consistent results with Hgb in the urine w/ negative scans in past.   Dg Abd 1 View  01/22/2013   *RADIOLOGY REPORT*  Clinical Data: Abdominal pain vomiting diarrhea and fever  ABDOMEN - 1 VIEW  Comparison: None.  Findings: The bowel gas pattern is nonobstructive. There is no significant stool burden.  No radiopaque urinary tract calculi identified.  No abdominal mass is seen.  Evaluation for free intraperitoneal air is limited on supine radiographs.  No gross evidence of free air is seen.  The bones are unremarkable.  IMPRESSION: Nonobstructive bowel gas pattern.   Original Report Authenticated By: Britta Mccreedy, M.D.     1. Nausea & vomiting   2. Abdominal  pain, other specified site       MDM  Patient is nontoxic, nonseptic appearing, in no apparent distress.  Patient's pain and other symptoms adequately managed in emergency department.  Fluid bolus given.  Labs, imaging and vitals reviewed. UA results were wnl from previous visits for patient w/ negative work up for cause of Hgb in urnie.  Patient does not meet the SIRS or Sepsis criteria.  On repeat exam patient does not have a surgical abdomen and there are nor peritoneal signs.  No indication of appendicitis, bowel obstruction, bowel perforation, cholecystitis, diverticulitis.  Patient discharged home with symptomatic treatment and given strict instructions for follow-up with their primary care physician.  I have also discussed reasons to return immediately to the ER.  Patient expresses understanding and agrees with plan.           Jeannetta Ellis, PA-C 01/22/13 [phone]

## 2013-01-22 NOTE — ED Notes (Signed)
Pt states he feels like he is going to vomit

## 2013-01-24 NOTE — ED Provider Notes (Signed)
Medical screening examination/treatment/procedure(s) were performed by non-physician practitioner and as supervising physician I was immediately available for consultation/collaboration.  Jasmine Awe, MD 01/24/13 [phone]

## 2013-11-05 ENCOUNTER — Emergency Department (HOSPITAL_COMMUNITY)
Admission: EM | Admit: 2013-11-05 | Discharge: 2013-11-05 | Disposition: A | Discharge status: Home / Self Care | Payer: BC Managed Care – PPO | Attending: Emergency Medicine | Admitting: Emergency Medicine

## 2013-11-05 ENCOUNTER — Encounter (HOSPITAL_COMMUNITY): Payer: Self-pay | Admitting: Emergency Medicine

## 2013-11-05 DIAGNOSIS — R112 Nausea with vomiting, unspecified: Secondary | ICD-10-CM | POA: Insufficient documentation

## 2013-11-05 DIAGNOSIS — Z79899 Other long term (current) drug therapy: Secondary | ICD-10-CM | POA: Insufficient documentation

## 2013-11-05 LAB — COMPREHENSIVE METABOLIC PANEL
ALBUMIN: 4.8 g/dL (ref 3.5–5.2)
ALT: 20 U/L (ref 0–53)
AST: 18 U/L (ref 0–37)
Alkaline Phosphatase: 77 U/L (ref 39–117)
BILIRUBIN TOTAL: 0.3 mg/dL (ref 0.3–1.2)
BUN: 16 mg/dL (ref 6–23)
CALCIUM: 9.9 mg/dL (ref 8.4–10.5)
CHLORIDE: 106 meq/L (ref 96–112)
CO2: 21 mEq/L (ref 19–32)
CREATININE: 0.82 mg/dL (ref 0.50–1.35)
GFR calc Af Amer: >90 mL/min (ref >90)
GFR calc non Af Amer: >90 mL/min (ref >90)
Glucose, Bld: 133 mg/dL — ABNORMAL HIGH (ref 70–99)
Potassium: 4.3 mEq/L (ref 3.7–5.3)
Sodium: 145 mEq/L (ref 137–147)
Total Protein: 9.2 g/dL — ABNORMAL HIGH (ref 6.0–8.3)

## 2013-11-05 LAB — CBC WITH DIFFERENTIAL/PLATELET
BASOS ABS: 0 10*3/uL (ref 0.0–0.1)
BASOS PCT: 0 % (ref 0–1)
EOS ABS: 0 10*3/uL (ref 0.0–0.7)
EOS PCT: 0 % (ref 0–5)
HEMATOCRIT: 38.8 % — AB (ref 39.0–52.0)
HEMOGLOBIN: 13.9 g/dL (ref 13.0–17.0)
Lymphocytes Relative: 13 % (ref 12–46)
Lymphs Abs: 1.3 10*3/uL (ref 0.7–4.0)
MCH: 32.4 pg (ref 26.0–34.0)
MCHC: 35.8 g/dL (ref 30.0–36.0)
MCV: 90.4 fL (ref 78.0–100.0)
MONO ABS: 0.9 10*3/uL (ref 0.1–1.0)
MONOS PCT: 9 % (ref 3–12)
Neutro Abs: 7.7 10*3/uL (ref 1.7–7.7)
Neutrophils Relative %: 78 % — ABNORMAL HIGH (ref 43–77)
Platelets: 265 10*3/uL (ref 150–400)
RBC: 4.29 MIL/uL (ref 4.22–5.81)
RDW: 12.9 % (ref 11.5–15.5)
WBC: 9.9 10*3/uL (ref 4.0–10.5)

## 2013-11-05 LAB — URINALYSIS, ROUTINE W REFLEX MICROSCOPIC
Bilirubin Urine: NEGATIVE
GLUCOSE, UA: NEGATIVE mg/dL
KETONES UR: NEGATIVE mg/dL
LEUKOCYTES UA: NEGATIVE
NITRITE: NEGATIVE
PH: 5 (ref 5.0–8.0)
PROTEIN: 30 mg/dL — AB
Specific Gravity, Urine: 1.035 — ABNORMAL HIGH (ref 1.005–1.030)
Urobilinogen, UA: 0.2 mg/dL (ref 0.0–1.0)

## 2013-11-05 LAB — URINE MICROSCOPIC-ADD ON

## 2013-11-05 LAB — LIPASE, BLOOD: LIPASE: 12 U/L (ref 11–59)

## 2013-11-05 MED ORDER — PROMETHAZINE HCL 25 MG PO TABS: 25.0000 mg | ORAL_TABLET | Freq: Four times a day (QID) | ORAL | Status: DC | PRN

## 2013-11-05 MED ORDER — SODIUM CHLORIDE 0.9 % IV BOLUS (SEPSIS)
1000.0000 mL | Freq: Once | INTRAVENOUS | Status: AC
  Administered 2013-11-05: 1000 mL via INTRAVENOUS

## 2013-11-05 NOTE — ED Notes (Signed)
Pt tolerating PO fluids - asked for a second cup.

## 2013-11-05 NOTE — ED Provider Notes (Signed)
CSN: [id number]     Arrival date & time 11/05/13  1312 History   First MD Initiated Contact with Patient 11/05/13 2021     Chief Complaint  Patient presents with  . Emesis     (Consider location/radiation/quality/duration/timing/severity/associated sxs/prior Treatment) Patient is a 48 y.o. male presenting with vomiting.  Emesis  48 yo male presents with N/V x 4-5 episodes with multiple episodes of dry heaving that started yesterday while at work around 5 pm. Patient admits to abdominal pain "only when I have dry heaves". Patient denies any fever/chills, Diarrhea, constipation, testicle pain, flank pain, penile discharge, dysuria, hematuria, or bloody stools. Denies any chest pain, SOB, HA, or dizziness. N/V not precipitated by food. Patient works as a Scientist, water quality. Patients states he bites his nails a lot and remember chewing his nail yesterday. Patient denies any PMH. Denies alcohol or drug use.   History reviewed. No pertinent past medical history. History reviewed. No pertinent past surgical history. History reviewed. No pertinent family history. History  Substance Use Topics  . Smoking status: Never Smoker   . Smokeless tobacco: Not on file  . Alcohol Use: No    Review of Systems  Gastrointestinal: Positive for vomiting.  All other systems reviewed and are negative.    Allergies  Review of patient's allergies indicates no known allergies.  Home Medications   Current Outpatient Rx  Name  Route  Sig  Dispense  Refill  . ibuprofen (ADVIL,MOTRIN) 200 MG tablet   Oral   Take 400 mg by mouth every 6 (six) hours as needed for mild pain.         . Multiple Vitamin (ONE-A-DAY MENS PO)   Oral   Take 1 tablet by mouth daily.         Marland Kitchen omeprazole (PRILOSEC OTC) 20 MG tablet   Oral   Take 20 mg by mouth daily.         . ondansetron (ZOFRAN) 4 MG tablet   Oral   Take 4 mg by mouth every 6 (six) hours as needed for nausea.         . promethazine (PHENERGAN) 25 MG tablet   Oral   Take 1 tablet (25 mg total) by mouth every 6 (six) hours as needed for nausea or vomiting.   12 tablet   0    BP 117/64  Pulse 67  Temp(Src) 98.2 F (36.8 C) (Oral)  Resp 18  Wt 140 lb (63.504 kg)  SpO2 99% Physical Exam  Nursing note and vitals reviewed. Constitutional: He is oriented to person, place, and time. He appears well-developed and well-nourished. No distress.  HENT:  Head: Normocephalic and atraumatic.  Eyes: Conjunctivae and EOM are normal. Pupils are equal, round, and reactive to light. No scleral icterus.  Neck: Normal range of motion. Neck supple. No JVD present. No tracheal deviation present.  Cardiovascular: Normal rate and regular rhythm.  Exam reveals no gallop and no friction rub.   No murmur heard. Pulmonary/Chest: Effort normal and breath sounds normal. No respiratory distress. He has no wheezes. He has no rhonchi. He has no rales.  Abdominal: Soft. Bowel sounds are normal. He exhibits no distension. There is no hepatosplenomegaly. There is no tenderness. There is no rigidity, no rebound, no guarding, no tenderness at McBurney's point and negative Murphy's sign. Hernia confirmed negative in the right inguinal area and confirmed negative in the left inguinal area.  Genitourinary: Penis normal. Right testis shows no mass, no swelling and no tenderness. Left  testis shows no mass, no swelling and no tenderness. Circumcised. No phimosis, paraphimosis, hypospadias, penile erythema or penile tenderness. No discharge found.  Musculoskeletal: Normal range of motion. He exhibits no edema.  Lymphadenopathy:       Right: No inguinal adenopathy present.       Left: No inguinal adenopathy present.  Neurological: He is alert and oriented to person, place, and time.  Skin: Skin is warm and dry. He is not diaphoretic.  Psychiatric: He has a normal mood and affect. His behavior is normal.    ED Course  Procedures (including critical care time) Labs Review Labs  Reviewed  CBC WITH DIFFERENTIAL - Abnormal; Notable for the following:    HCT 38.8 (*)    Neutrophils Relative % 78 (*)    All other components within normal limits  COMPREHENSIVE METABOLIC PANEL - Abnormal; Notable for the following:    Glucose, Bld 133 (*)    Total Protein 9.2 (*)    All other components within normal limits  URINALYSIS, ROUTINE W REFLEX MICROSCOPIC - Abnormal; Notable for the following:    Specific Gravity, Urine 1.035 (*)    Hgb urine dipstick TRACE (*)    Protein, ur 30 (*)    All other components within normal limits  URINE MICROSCOPIC-ADD ON - Abnormal; Notable for the following:    Bacteria, UA FEW (*)    All other components within normal limits  LIPASE, BLOOD   Imaging Review No results found.   EKG Interpretation None      MDM   Final diagnoses:  Nausea & vomiting   Patient currently afebrile with normal VS.  Patient feeling markedly better after IV fluids.  Patient denies any abdominal pain. Belly exam reveals soft nontender abdomen.  Labs consistent with Dehydration Trace hgb in urine consistent with previous results. No hx of kidney stones. Doubt nephrolithiasis. UA not consistent with UTI CBC WNL CMP WNL Lipase negative. Doubt pancreatitis.   Hx and exam not consistent with Cholecystitis. No evidence of appendicitis on exam. Suspect patient symptoms likely secondary to GI bug. 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 Likely patient inoculated himself with nail biting after handling money. Patient now asymptomatic and tolerating PO fluids. Appears stable for discharge. Plan to discharge home with strict return precautions for any worsening symptoms.   Meds given in ED:  Medications  sodium chloride 0.9 % bolus 1,000 mL (0 mLs Intravenous Stopped 11/05/13 2246)    Discharge Medication List as of 11/05/2013 10:17 PM    START taking these medications   Details  promethazine (PHENERGAN) 25 MG tablet Take 1 tablet  (25 mg total) by mouth every 6 (six) hours as needed for nausea or vomiting., Starting 11/05/2013, Until Discontinued, Print           Sherrie George, PA-C 11/06/13 1510

## 2013-11-05 NOTE — ED Notes (Signed)
Per pt sts vomiting since yesterday and unable to hold down any fluids. sts generalized abdominal pain from vomiting.

## 2013-11-05 NOTE — ED Notes (Signed)
Pt given PO fluids.

## 2013-11-08 NOTE — ED Provider Notes (Signed)
Medical screening examination/treatment/procedure(s) were performed by non-physician practitioner and as supervising physician I was immediately available for consultation/collaboration.   EKG Interpretation None        Alfonzo Feller, DO 11/08/13 1344

## 2014-03-22 ENCOUNTER — Emergency Department (HOSPITAL_COMMUNITY)
Admission: EM | Admit: 2014-03-22 | Discharge: 2014-03-22 | Disposition: A | Discharge status: Home / Self Care | Payer: BC Managed Care – PPO | Attending: Emergency Medicine | Admitting: Emergency Medicine

## 2014-03-22 ENCOUNTER — Encounter (HOSPITAL_COMMUNITY): Payer: Self-pay | Admitting: Emergency Medicine

## 2014-03-22 DIAGNOSIS — R110 Nausea: Secondary | ICD-10-CM | POA: Insufficient documentation

## 2014-03-22 DIAGNOSIS — Z79899 Other long term (current) drug therapy: Secondary | ICD-10-CM | POA: Insufficient documentation

## 2014-03-22 DIAGNOSIS — N390 Urinary tract infection, site not specified: Secondary | ICD-10-CM

## 2014-03-22 DIAGNOSIS — N5089 Other specified disorders of the male genital organs: Secondary | ICD-10-CM | POA: Insufficient documentation

## 2014-03-22 LAB — URINALYSIS, ROUTINE W REFLEX MICROSCOPIC
Glucose, UA: NEGATIVE mg/dL
Ketones, ur: NEGATIVE mg/dL
Nitrite: POSITIVE — AB
Protein, ur: 30 mg/dL — AB
Specific Gravity, Urine: 1.026 (ref 1.005–1.030)
Urobilinogen, UA: 0.2 mg/dL (ref 0.0–1.0)
pH: 5 (ref 5.0–8.0)

## 2014-03-22 LAB — URINE MICROSCOPIC-ADD ON

## 2014-03-22 LAB — CBC WITH DIFFERENTIAL/PLATELET
Basophils Absolute: 0 10*3/uL (ref 0.0–0.1)
Basophils Relative: 0 % (ref 0–1)
EOS ABS: 0 10*3/uL (ref 0.0–0.7)
EOS PCT: 0 % (ref 0–5)
HCT: 35.5 % — ABNORMAL LOW (ref 39.0–52.0)
HEMOGLOBIN: 12 g/dL — AB (ref 13.0–17.0)
LYMPHS ABS: 3 10*3/uL (ref 0.7–4.0)
Lymphocytes Relative: 20 % (ref 12–46)
MCH: 30.4 pg (ref 26.0–34.0)
MCHC: 33.8 g/dL (ref 30.0–36.0)
MCV: 89.9 fL (ref 78.0–100.0)
MONO ABS: 1.2 10*3/uL — AB (ref 0.1–1.0)
MONOS PCT: 8 % (ref 3–12)
Neutro Abs: 10.9 10*3/uL — ABNORMAL HIGH (ref 1.7–7.7)
Neutrophils Relative %: 72 % (ref 43–77)
Platelets: 233 10*3/uL (ref 150–400)
RBC: 3.95 MIL/uL — AB (ref 4.22–5.81)
RDW: 13.3 % (ref 11.5–15.5)
WBC: 15.1 10*3/uL — ABNORMAL HIGH (ref 4.0–10.5)

## 2014-03-22 LAB — BASIC METABOLIC PANEL
Anion gap: 14 (ref 5–15)
BUN: 11 mg/dL (ref 6–23)
CALCIUM: 8.9 mg/dL (ref 8.4–10.5)
CO2: 24 mEq/L (ref 19–32)
CREATININE: 0.86 mg/dL (ref 0.50–1.35)
Chloride: 102 mEq/L (ref 96–112)
GFR calc Af Amer: >90 mL/min (ref >90)
GLUCOSE: 128 mg/dL — AB (ref 70–99)
POTASSIUM: 3.9 meq/L (ref 3.7–5.3)
Sodium: 140 mEq/L (ref 137–147)

## 2014-03-22 MED ORDER — ACETAMINOPHEN 325 MG PO TABS
650.0000 mg | ORAL_TABLET | Freq: Once | ORAL | Status: AC
  Administered 2014-03-22: 650 mg via ORAL
  Filled 2014-03-22: qty 2

## 2014-03-22 MED ORDER — LIDOCAINE HCL (PF) 1 % IJ SOLN
2.0000 mL | Freq: Once | INTRAMUSCULAR | Status: AC
  Administered 2014-03-22: 2 mL

## 2014-03-22 MED ORDER — CEFTRIAXONE SODIUM 1 G IJ SOLR
1.0000 g | Freq: Once | INTRAMUSCULAR | Status: AC
  Administered 2014-03-22: 1 g via INTRAMUSCULAR
  Filled 2014-03-22: qty 10

## 2014-03-22 MED ORDER — LIDOCAINE HCL (PF) 1 % IJ SOLN
INTRAMUSCULAR | Status: AC
  Filled 2014-03-22: qty 5

## 2014-03-22 MED ORDER — CEPHALEXIN 500 MG PO CAPS: 500.0000 mg | ORAL_CAPSULE | Freq: Three times a day (TID) | ORAL | Status: AC

## 2014-03-22 NOTE — ED Notes (Addendum)
He state he noticed drops of blood coming from penis yesterday then today his testicles "feel sore like someone is squeezing them every time i walk." he reports normal urination

## 2014-03-22 NOTE — ED Provider Notes (Signed)
CSN: [id number]     Arrival date & time 03/22/14  1807 History   First MD Initiated Contact with Patient 03/22/14 2048     Chief Complaint  Patient presents with  . Hematuria     (Consider location/radiation/quality/duration/timing/severity/associated sxs/prior Treatment) Patient is a 48 y.o. male presenting with male genitourinary complaint.  Male GU Problem Presenting symptoms: scrotal pain   Presenting symptoms: no dysuria   Context: after urination   Relieved by:  None tried Worsened by:  Movement Associated symptoms: hematuria and nausea   Associated symptoms: no abdominal pain   Associated symptoms comment:  Hematuria Nausea:    Severity:  Mild Risk factors: recent sexual activity   Risk factors: no bladder surgery and no change in medication     History reviewed. No pertinent past medical history. History reviewed. No pertinent past surgical history. History reviewed. No pertinent family history. History  Substance Use Topics  . Smoking status: Never Smoker   . Smokeless tobacco: Not on file  . Alcohol Use: No    Review of Systems  Unable to perform ROS Constitutional: Negative for activity change.  HENT: Negative for congestion.   Eyes: Negative for visual disturbance.  Respiratory: Negative for cough and shortness of breath.   Cardiovascular: Negative for chest pain and leg swelling.  Gastrointestinal: Positive for nausea. Negative for abdominal pain and blood in stool.  Genitourinary: Positive for hematuria. Negative for dysuria.  Musculoskeletal: Negative for back pain.  Skin: Negative for color change.  Neurological: Negative for syncope and headaches.  Psychiatric/Behavioral: Negative for agitation.  All other systems reviewed and are negative.     Allergies  Review of patient's allergies indicates no known allergies.  Home Medications   Prior to Admission medications   Medication Sig Start Date End Date Taking? Authorizing Provider  ibuprofen  (ADVIL,MOTRIN) 200 MG tablet Take 400 mg by mouth every 6 (six) hours as needed for mild pain.    Historical Provider, MD  Multiple Vitamin (ONE-A-DAY MENS PO) Take 1 tablet by mouth daily.    Historical Provider, MD  omeprazole (PRILOSEC OTC) 20 MG tablet Take 20 mg by mouth daily.    Historical Provider, MD  ondansetron (ZOFRAN) 4 MG tablet Take 4 mg by mouth every 6 (six) hours as needed for nausea. 07/20/12   Lisette Paz, PA-C  promethazine (PHENERGAN) 25 MG tablet Take 1 tablet (25 mg total) by mouth every 6 (six) hours as needed for nausea or vomiting. 11/05/13   Sherrie George, PA-C   BP 123/70  Pulse 86  Temp(Src) 99.3 F (37.4 C) (Oral)  Resp 18  Ht 6\' 1"  (1.854 m)  Wt 140 lb (63.504 kg)  BMI 18.47 kg/m2  SpO2 98% Physical Exam  Nursing note and vitals reviewed. Constitutional: He is oriented to person, place, and time. He appears well-developed and well-nourished.  HENT:  Head: Normocephalic.  Eyes: Pupils are equal, round, and reactive to light.  Neck: Neck supple.  Cardiovascular: Normal rate and regular rhythm.  Exam reveals no gallop and no friction rub.   No murmur heard. Pulmonary/Chest: Effort normal. No respiratory distress.  Abdominal: Soft. He exhibits no distension. There is no tenderness. Hernia confirmed negative in the right inguinal area and confirmed negative in the left inguinal area.  Genitourinary: Penis normal. Right testis shows no swelling and no tenderness. Right testis is descended. Left testis shows swelling and tenderness. Left testis is descended. No discharge found.  Left testicle has fullness on exam, non erythremia  or severe tenderness. Normal lie   Musculoskeletal: He exhibits no edema.  Neurological: He is alert and oriented to person, place, and time.  Skin: Skin is warm.  Psychiatric: He has a normal mood and affect.    ED Course  Procedures (including critical care time) Labs Review Labs Reviewed  URINALYSIS, ROUTINE W REFLEX  MICROSCOPIC - Abnormal; Notable for the following:    Color, Urine AMBER (*)    APPearance CLOUDY (*)    Hgb urine dipstick LARGE (*)    Bilirubin Urine SMALL (*)    Protein, ur 30 (*)    Nitrite POSITIVE (*)    Leukocytes, UA LARGE (*)    All other components within normal limits  CBC WITH DIFFERENTIAL - Abnormal; Notable for the following:    WBC 15.1 (*)    RBC 3.95 (*)    Hemoglobin 12.0 (*)    HCT 35.5 (*)    Neutro Abs 10.9 (*)    Monocytes Absolute 1.2 (*)    All other components within normal limits  BASIC METABOLIC PANEL - Abnormal; Notable for the following:    Glucose, Bld 128 (*)    All other components within normal limits  URINE MICROSCOPIC-ADD ON - Abnormal; Notable for the following:    Bacteria, UA MANY (*)    All other components within normal limits  GC/CHLAMYDIA PROBE AMP  URINE CULTURE    Imaging Review No results found.   EKG Interpretation None      MDM   Final diagnoses:  UTI (lower urinary tract infection)   48 y/o male presents with hematuria and left testicle tenderness and fullness that is moderate and onset today. Normal lie of testicle- unlikely torsion. Possible GC/CT infection- patient states he only has sex with his wife, denies discharge. Urine test sent.  U/A- indicates UTI,patient started on PO Keflex for 1 week and instructed to follow-up with urology in regards to testicle. Unlikely emergent cause of testicle swelling but patient given return precautions of worsening pain or any other concerns.     Claudean Severance, MD 03/23/14 [phone]

## 2014-03-23 NOTE — ED Provider Notes (Signed)
I saw and evaluated the patient, reviewed the resident's note and I agree with the findings and plan.   EKG Interpretation None      UTI. Fever. abx in ER. Urine culture sent. Home with abx. Doubt infected stone. pcp follow up. Strict return precautions given.   Results for orders placed during the hospital encounter of 03/22/14  URINALYSIS, ROUTINE W REFLEX MICROSCOPIC      Result Value Ref Range   Color, Urine AMBER (*) YELLOW   APPearance CLOUDY (*) CLEAR   Specific Gravity, Urine 1.026  1.005 - 1.030   pH 5.0  5.0 - 8.0   Glucose, UA NEGATIVE  NEGATIVE mg/dL   Hgb urine dipstick LARGE (*) NEGATIVE   Bilirubin Urine SMALL (*) NEGATIVE   Ketones, ur NEGATIVE  NEGATIVE mg/dL   Protein, ur 30 (*) NEGATIVE mg/dL   Urobilinogen, UA 0.2  0.0 - 1.0 mg/dL   Nitrite POSITIVE (*) NEGATIVE   Leukocytes, UA LARGE (*) NEGATIVE  CBC WITH DIFFERENTIAL      Result Value Ref Range   WBC 15.1 (*) 4.0 - 10.5 K/uL   RBC 3.95 (*) 4.22 - 5.81 MIL/uL   Hemoglobin 12.0 (*) 13.0 - 17.0 g/dL   HCT 35.5 (*) 39.0 - 52.0 %   MCV 89.9  78.0 - 100.0 fL   MCH 30.4  26.0 - 34.0 pg   MCHC 33.8  30.0 - 36.0 g/dL   RDW 13.3  11.5 - 15.5 %   Platelets 233  150 - 400 K/uL   Neutrophils Relative % 72  43 - 77 %   Neutro Abs 10.9 (*) 1.7 - 7.7 K/uL   Lymphocytes Relative 20  12 - 46 %   Lymphs Abs 3.0  0.7 - 4.0 K/uL   Monocytes Relative 8  3 - 12 %   Monocytes Absolute 1.2 (*) 0.1 - 1.0 K/uL   Eosinophils Relative 0  0 - 5 %   Eosinophils Absolute 0.0  0.0 - 0.7 K/uL   Basophils Relative 0  0 - 1 %   Basophils Absolute 0.0  0.0 - 0.1 K/uL  BASIC METABOLIC PANEL      Result Value Ref Range   Sodium 140  137 - 147 mEq/L   Potassium 3.9  3.7 - 5.3 mEq/L   Chloride 102  96 - 112 mEq/L   CO2 24  19 - 32 mEq/L   Glucose, Bld 128 (*) 70 - 99 mg/dL   BUN 11  6 - 23 mg/dL   Creatinine, Ser 0.86  0.50 - 1.35 mg/dL   Calcium 8.9  8.4 - 10.5 mg/dL   GFR calc non Af Amer >90  >90 mL/min   GFR calc Af Amer >90   >90 mL/min   Anion gap 14  5 - 15  URINE MICROSCOPIC-ADD ON      Result Value Ref Range   Squamous Epithelial / LPF RARE  RARE   WBC, UA TOO NUMEROUS TO COUNT  <3 WBC/hpf   RBC / HPF 21-50  <3 RBC/hpf   Bacteria, UA MANY (*) RARE     Hoy Morn, MD 03/23/14 1547

## 2014-03-25 LAB — URINE CULTURE: Colony Count: >=100000

## 2014-03-26 ENCOUNTER — Telehealth (HOSPITAL_BASED_OUTPATIENT_CLINIC_OR_DEPARTMENT_OTHER): Payer: Self-pay | Admitting: Emergency Medicine

## 2014-03-26 NOTE — Progress Notes (Signed)
ED Antimicrobial Stewardship Positive Culture Follow Up   Lee Greer is an 48 y.o. male who presented to [facility] on 03/22/2014 with a chief complaint of  Chief Complaint  Patient presents with  . Hematuria    Recent Results (from the past 720 hour(s))  URINE CULTURE     Status: None   Collection Time    03/22/14  8:55 PM      Result Value Ref Range Status   Specimen Description URINE, CLEAN CATCH   Final   Special Requests ADDED [id number]   Final   Culture  Setup Time     Final   Value: 03/22/2014 00:28     Performed at [facility]     Final   Value: >=100,000 COLONIES/ML     Performed at [facility]   Culture     Final   Value: ESCHERICHIA COLI     Performed at [facility]   Report Status 03/25/2014 FINAL   Final   Organism ID, Bacteria ESCHERICHIA COLI   Final    [x]  Treated with cephalexin, organism resistant to prescribed antimicrobial []  Patient discharged originally without antimicrobial agent and treatment is now indicated  New antibiotic prescription: Bactrim DS 1 tablet twice daily for 7 days  ED Provider: Harvie Heck, PA-C   [name] 03/26/2014, 10:53 AM Infectious Diseases Pharmacist Phone# [phone]

## 2014-03-26 NOTE — Telephone Encounter (Signed)
Post ED Visit - Positive Culture Follow-up: Successful Patient Follow-Up  Culture assessed and recommendations reviewed by: []  Wes Dewey, Pharm.D., BCPS [x]  Heide Guile, Pharm.D., BCPS []  Alycia Rossetti, Pharm.D., BCPS []  [place], [place].D., BCPS, AAHIVP []  Legrand Como, Pharm.D., BCPS, AAHIVP  Positive urine culture  []  Patient discharged without antimicrobial prescription and treatment is now indicated [x]  Organism is resistant to prescribed ED discharge antimicrobial []  Patient with positive blood cultures  Changes discussed with ED provider: Harvie Heck PA-C New antibiotic prescription: Bactrim DS 1 tablet BID x 7 days    Rusty Glodowski 03/26/2014, 1:53 PM

## 2014-03-26 NOTE — Telephone Encounter (Signed)
Unable to contact patient via phone. Sent letter. °

## 2014-03-27 LAB — GC/CHLAMYDIA PROBE AMP
CT Probe RNA: NEGATIVE
GC Probe RNA: NEGATIVE

## 2014-08-30 ENCOUNTER — Encounter (HOSPITAL_COMMUNITY): Payer: Self-pay

## 2014-08-30 ENCOUNTER — Emergency Department (HOSPITAL_COMMUNITY)
Admission: EM | Admit: 2014-08-30 | Discharge: 2014-08-30 | Disposition: A | Discharge status: Home / Self Care | Payer: BC Managed Care – PPO | Attending: Emergency Medicine | Admitting: Emergency Medicine

## 2014-08-30 DIAGNOSIS — R112 Nausea with vomiting, unspecified: Secondary | ICD-10-CM | POA: Insufficient documentation

## 2014-08-30 DIAGNOSIS — R1084 Generalized abdominal pain: Secondary | ICD-10-CM | POA: Insufficient documentation

## 2014-08-30 DIAGNOSIS — R197 Diarrhea, unspecified: Secondary | ICD-10-CM | POA: Insufficient documentation

## 2014-08-30 LAB — URINALYSIS, ROUTINE W REFLEX MICROSCOPIC
Bilirubin Urine: NEGATIVE
Glucose, UA: NEGATIVE mg/dL
Ketones, ur: NEGATIVE mg/dL
Leukocytes, UA: NEGATIVE
NITRITE: NEGATIVE
PH: 5 (ref 5.0–8.0)
Protein, ur: 30 mg/dL — AB
SPECIFIC GRAVITY, URINE: 1.024 (ref 1.005–1.030)
Urobilinogen, UA: 0.2 mg/dL (ref 0.0–1.0)

## 2014-08-30 LAB — CBC WITH DIFFERENTIAL/PLATELET
BASOS PCT: 0 % (ref 0–1)
Basophils Absolute: 0 10*3/uL (ref 0.0–0.1)
Eosinophils Absolute: 0 10*3/uL (ref 0.0–0.7)
Eosinophils Relative: 0 % (ref 0–5)
HCT: 41.6 % (ref 39.0–52.0)
Hemoglobin: 14.6 g/dL (ref 13.0–17.0)
Lymphocytes Relative: 16 % (ref 12–46)
Lymphs Abs: 1.7 10*3/uL (ref 0.7–4.0)
MCH: 31.1 pg (ref 26.0–34.0)
MCHC: 35.1 g/dL (ref 30.0–36.0)
MCV: 88.7 fL (ref 78.0–100.0)
Monocytes Absolute: 0.6 10*3/uL (ref 0.1–1.0)
Monocytes Relative: 5 % (ref 3–12)
NEUTROS ABS: 8.4 10*3/uL — AB (ref 1.7–7.7)
NEUTROS PCT: 79 % — AB (ref 43–77)
PLATELETS: 269 10*3/uL (ref 150–400)
RBC: 4.69 MIL/uL (ref 4.22–5.81)
RDW: 13.8 % (ref 11.5–15.5)
WBC: 10.6 10*3/uL — ABNORMAL HIGH (ref 4.0–10.5)

## 2014-08-30 LAB — URINE MICROSCOPIC-ADD ON

## 2014-08-30 LAB — COMPREHENSIVE METABOLIC PANEL
ALK PHOS: 69 U/L (ref 39–117)
ALT: 24 U/L (ref 0–53)
AST: 28 U/L (ref 0–37)
Albumin: 5.3 g/dL — ABNORMAL HIGH (ref 3.5–5.2)
Anion gap: 14 (ref 5–15)
BILIRUBIN TOTAL: 0.6 mg/dL (ref 0.3–1.2)
BUN: 19 mg/dL (ref 6–23)
CHLORIDE: 110 meq/L (ref 96–112)
CO2: 18 mmol/L — ABNORMAL LOW (ref 19–32)
Calcium: 10.1 mg/dL (ref 8.4–10.5)
Creatinine, Ser: 1.25 mg/dL (ref 0.50–1.35)
GFR calc Af Amer: 78 mL/min — ABNORMAL LOW (ref >90)
GFR calc non Af Amer: 67 mL/min — ABNORMAL LOW (ref >90)
Glucose, Bld: 148 mg/dL — ABNORMAL HIGH (ref 70–99)
POTASSIUM: 4.2 mmol/L (ref 3.5–5.1)
SODIUM: 142 mmol/L (ref 135–145)
Total Protein: 9.2 g/dL — ABNORMAL HIGH (ref 6.0–8.3)

## 2014-08-30 LAB — LIPASE, BLOOD: Lipase: 21 U/L (ref 11–59)

## 2014-08-30 MED ORDER — SODIUM CHLORIDE 0.9 % IV BOLUS (SEPSIS)
1000.0000 mL | Freq: Once | INTRAVENOUS | Status: AC
  Administered 2014-08-30: 1000 mL via INTRAVENOUS

## 2014-08-30 MED ORDER — DICYCLOMINE HCL 10 MG PO CAPS
10.0000 mg | ORAL_CAPSULE | Freq: Once | ORAL | Status: AC
  Administered 2014-08-30: 10 mg via ORAL
  Filled 2014-08-30: qty 1

## 2014-08-30 MED ORDER — ONDANSETRON 4 MG PO TBDP: 4.0000 mg | ORAL_TABLET | Freq: Three times a day (TID) | ORAL | Status: DC | PRN

## 2014-08-30 MED ORDER — ONDANSETRON HCL 4 MG/2ML IJ SOLN
4.0000 mg | Freq: Once | INTRAMUSCULAR | Status: AC
  Administered 2014-08-30: 4 mg via INTRAVENOUS
  Filled 2014-08-30: qty 2

## 2014-08-30 MED ORDER — DICYCLOMINE HCL 20 MG PO TABS: 20.0000 mg | ORAL_TABLET | Freq: Two times a day (BID) | ORAL | Status: DC

## 2014-08-30 NOTE — ED Notes (Signed)
The patient is unable to give an urine specimen at this time. The tech has reported to the RN in charge. 

## 2014-08-30 NOTE — ED Notes (Signed)
Pt. Presents to ED with complaint of N/V/D starting this AM. Denies abd pain prior to episodes of vomiting. Vomited x4, diarrhea x2.

## 2014-08-30 NOTE — ED Provider Notes (Signed)
CSN: [id number]     Arrival date & time 08/30/14  0456 History   First MD Initiated Contact with Patient 08/30/14 0602     Chief Complaint  Patient presents with  . Vomiting   Lee Greer is a 48 y.o. male who presents to the emergency department complaining of nausea vomiting and diarrhea for the past 20 hours. The patient reports that he started having vomiting and diarrhea yesterday and then started having generalized abdominal pain after dry heaving about 10 hours ago. Patient reports he's vomited 4 times and had diarrhea twice. Patient reports his abdominal pain is 6 out of 10 when he dry heaves, but otherwise he does not have abdominal pain now. The patient denies previous abdominal surgeries. The patient denies others at home similar symptoms. The patient denies changes to his food. Patient denies fevers, chills, hematemesis, dysuria, hematuria, urinary frequency, urinary urgency, flank pain, hematochezia, melena, chest pain, shortness of breath, cough, numbness, tingling or weakness.   (Consider location/radiation/quality/duration/timing/severity/associated sxs/prior Treatment) HPI  History reviewed. No pertinent past medical history. History reviewed. No pertinent past surgical history. No family history on file. History  Substance Use Topics  . Smoking status: Never Smoker   . Smokeless tobacco: Not on file  . Alcohol Use: No    Review of Systems  Constitutional: Negative for fever and chills.  HENT: Negative for congestion, ear pain, sore throat and trouble swallowing.   Eyes: Negative for pain and visual disturbance.  Respiratory: Negative for cough, shortness of breath and wheezing.   Cardiovascular: Negative for chest pain, palpitations and leg swelling.  Gastrointestinal: Positive for nausea, vomiting, abdominal pain and diarrhea. Negative for blood in stool.  Genitourinary: Negative for dysuria, urgency, frequency, hematuria, flank pain, decreased urine volume and  difficulty urinating.  Musculoskeletal: Negative for myalgias, back pain and neck pain.  Skin: Negative for rash and wound.  Neurological: Negative for dizziness, weakness, light-headedness and headaches.  All other systems reviewed and are negative.     Allergies  Review of patient's allergies indicates no known allergies.  Home Medications   Prior to Admission medications   Medication Sig Start Date End Date Taking? Authorizing Provider  dicyclomine (BENTYL) 20 MG tablet Take 1 tablet (20 mg total) by mouth 2 (two) times daily. 08/30/14   Verda Cumins Tennyson Kallen, PA-C  ondansetron (ZOFRAN ODT) 4 MG disintegrating tablet Take 1 tablet (4 mg total) by mouth every 8 (eight) hours as needed for nausea or vomiting. 08/30/14   Verda Cumins Missi Mcmackin, PA-C   BP 126/74 mmHg  Pulse 66  Temp(Src) 98.3 F (36.8 C) (Oral)  Resp 19  Ht 6\' 1"  (1.854 m)  Wt 145 lb (65.772 kg)  BMI 19.13 kg/m2  SpO2 100% Physical Exam  Constitutional: He appears well-developed and well-nourished. No distress.  Non-toxic appearing.   HENT:  Head: Normocephalic and atraumatic.  Right Ear: External ear normal.  Left Ear: External ear normal.  Nose: Nose normal.  Mouth/Throat: Oropharynx is clear and moist. No oropharyngeal exudate.  Eyes: Conjunctivae are normal. Pupils are equal, round, and reactive to light. Right eye exhibits no discharge. Left eye exhibits no discharge.  Neck: Neck supple.  Cardiovascular: Normal rate, regular rhythm, normal heart sounds and intact distal pulses.  Exam reveals no gallop and no friction rub.   No murmur heard. Pulmonary/Chest: Effort normal and breath sounds normal. No respiratory distress. He has no wheezes. He has no rales.  Abdominal: Soft. Bowel sounds are normal. He exhibits no distension  and no mass. There is no tenderness. There is no rebound and no guarding.  Patient's abdomen is soft and nontender to palpation. Bowel sounds are present. No right lower quadrant  tenderness. Negative Rovsing sign. Negative psoas and obturator sign.  Musculoskeletal: He exhibits no edema.  Lymphadenopathy:    He has no cervical adenopathy.  Neurological: He is alert. Coordination normal.  Skin: Skin is warm and dry. No rash noted. He is not diaphoretic. No erythema. No pallor.  Psychiatric: He has a normal mood and affect. His behavior is normal.  Nursing note and vitals reviewed.   ED Course  Procedures (including critical care time) Labs Review Labs Reviewed  CBC WITH DIFFERENTIAL - Abnormal; Notable for the following:    WBC 10.6 (*)    Neutrophils Relative % 79 (*)    Neutro Abs 8.4 (*)    All other components within normal limits  COMPREHENSIVE METABOLIC PANEL - Abnormal; Notable for the following:    CO2 18 (*)    Glucose, Bld 148 (*)    Total Protein 9.2 (*)    Albumin 5.3 (*)    GFR calc non Af Amer 67 (*)    GFR calc Af Amer 78 (*)    All other components within normal limits  LIPASE, BLOOD  URINALYSIS, ROUTINE W REFLEX MICROSCOPIC    Imaging Review No results found.   EKG Interpretation None      Filed Vitals:   08/30/14 0502 08/30/14 0545 08/30/14 0600 08/30/14 0730  BP: 179/89 148/77 147/77 126/74  Pulse: 67 66 75 66  Temp: 98.3 F (36.8 C)     TempSrc: Oral     Resp: 17 11 24 19   Height: 6\' 1"  (1.854 m)     Weight: 145 lb (65.772 kg)     SpO2: 100% 100% 100% 100%     MDM   Meds given in ED:  Medications  ondansetron (ZOFRAN) injection 4 mg (4 mg Intravenous Given 08/30/14 0514)  sodium chloride 0.9 % bolus 1,000 mL (0 mLs Intravenous Stopped 08/30/14 0617)  dicyclomine (BENTYL) capsule 10 mg (10 mg Oral Given 08/30/14 0617)    Discharge Medication List as of 08/30/2014  7:32 AM    START taking these medications   Details  dicyclomine (BENTYL) 20 MG tablet Take 1 tablet (20 mg total) by mouth 2 (two) times daily., Starting 08/30/2014, Until Discontinued, Print    ondansetron (ZOFRAN ODT) 4 MG disintegrating tablet  Take 1 tablet (4 mg total) by mouth every 8 (eight) hours as needed for nausea or vomiting., Starting 08/30/2014, Until Discontinued, Print        Final diagnoses:  Nausea vomiting and diarrhea   Patient presented to the ED complaining of nausea, vomiting and diarrhea for the past 20 hours. The patient has had 4 episodes of vomiting total 2 episodes of diarrhea total. Patient reports some abdominal cramping that started after some dry heaving about 10 hours ago. Patient is afebrile and nontoxic appearing. Patient's abdomen is soft and nontender to palpation. Patient has a slightly elevated creatinine of 1.25 and a slightly decreased GFR at 78. Advised patient to follow-up with his primary care physician to have his creatinine checked. The patient's white count is 10.8. He reports he is no longer nauseated after Zofran. Patient reports feeling much better after normal saline bolus and Bentyl. Patient was tolerating by mouth liquids as well as crackers prior to discharge. Encourage patient to push fluids at home. Advised patient to follow-up with  his primary care provider next week. Strict return precautions were provided. Advised patient to return to the emergency department with new or worsening symptoms or new concerns. Advised he should return with fevers, chills or worsening abdominal pain as well. The patient verbalized understanding and agreement with plan.  This patient was discussed with Dr. Claudine Mouton who agrees with assessment and plan.     Hanley Hays, PA-C 08/30/14 1510  Everlene Balls, MD 08/30/14 [phone]

## 2014-08-30 NOTE — Discharge Instructions (Signed)
Nausea and Vomiting Nausea is a sick feeling that often comes before throwing up (vomiting). Vomiting is a reflex where stomach contents come out of your mouth. Vomiting can cause severe loss of body fluids (dehydration). Children and elderly adults can become dehydrated quickly, especially if they also have diarrhea. Nausea and vomiting are symptoms of a condition or disease. It is important to find the cause of your symptoms. CAUSES   Direct irritation of the stomach lining. This irritation can result from increased acid production (gastroesophageal reflux disease), infection, food poisoning, taking certain medicines (such as nonsteroidal anti-inflammatory drugs), alcohol use, or tobacco use.  Signals from the brain.These signals could be caused by a headache, heat exposure, an inner ear disturbance, increased pressure in the brain from injury, infection, a tumor, or a concussion, pain, emotional stimulus, or metabolic problems.  An obstruction in the gastrointestinal tract (bowel obstruction).  Illnesses such as diabetes, hepatitis, gallbladder problems, appendicitis, kidney problems, cancer, sepsis, atypical symptoms of a heart attack, or eating disorders.  Medical treatments such as chemotherapy and radiation.  Receiving medicine that makes you sleep (general anesthetic) during surgery. DIAGNOSIS Your caregiver may ask for tests to be done if the problems do not improve after a few days. Tests may also be done if symptoms are severe or if the reason for the nausea and vomiting is not clear. Tests may include:  Urine tests.  Blood tests.  Stool tests.  Cultures (to look for evidence of infection).  X-rays or other imaging studies. Test results can help your caregiver make decisions about treatment or the need for additional tests. TREATMENT You need to stay well hydrated. Drink frequently but in small amounts.You may wish to drink water, sports drinks, clear broth, or eat frozen  ice pops or gelatin dessert to help stay hydrated.When you eat, eating slowly may help prevent nausea.There are also some antinausea medicines that may help prevent nausea. HOME CARE INSTRUCTIONS   Take all medicine as directed by your caregiver.  If you do not have an appetite, do not force yourself to eat. However, you must continue to drink fluids.  If you have an appetite, eat a normal diet unless your caregiver tells you differently.  Eat a variety of complex carbohydrates (rice, wheat, potatoes, bread), lean meats, yogurt, fruits, and vegetables.  Avoid high-fat foods because they are more difficult to digest.  Drink enough water and fluids to keep your urine clear or pale yellow.  If you are dehydrated, ask your caregiver for specific rehydration instructions. Signs of dehydration may include:  Severe thirst.  Dry lips and mouth.  Dizziness.  Dark urine.  Decreasing urine frequency and amount.  Confusion.  Rapid breathing or pulse. SEEK IMMEDIATE MEDICAL CARE IF:   You have blood or brown flecks (like coffee grounds) in your vomit.  You have black or bloody stools.  You have a severe headache or stiff neck.  You are confused.  You have severe abdominal pain.  You have chest pain or trouble breathing.  You do not urinate at least once every 8 hours.  You develop cold or clammy skin.  You continue to vomit for longer than 24 to 48 hours.  You have a fever. MAKE SURE YOU:   Understand these instructions.  Will watch your condition.  Will get help right away if you are not doing well or get worse. Document Released: 08/23/2005 Document Revised: 11/15/2011 Document Reviewed: 01/20/2011 ExitCare Patient Information 2015 Exit[employer], LLC. This information is not intended   to replace advice given to you by your health care provider. Make sure you discuss any questions you have with your health care provider. Diarrhea Diarrhea is frequent loose and watery  bowel movements. It can cause you to feel weak and dehydrated. Dehydration can cause you to become tired and thirsty, have a dry mouth, and have decreased urination that often is dark yellow. Diarrhea is a sign of another problem, most often an infection that will not last long. In most cases, diarrhea typically lasts 2-3 days. However, it can last longer if it is a sign of something more serious. It is important to treat your diarrhea as directed by your caregiver to lessen or prevent future episodes of diarrhea. CAUSES  Some common causes include:  Gastrointestinal infections caused by viruses, bacteria, or parasites.  Food poisoning or food allergies.  Certain medicines, such as antibiotics, chemotherapy, and laxatives.  Artificial sweeteners and fructose.  Digestive disorders. HOME CARE INSTRUCTIONS  Ensure adequate fluid intake (hydration): Have 1 cup (8 oz) of fluid for each diarrhea episode. Avoid fluids that contain simple sugars or sports drinks, fruit juices, whole milk products, and sodas. Your urine should be clear or pale yellow if you are drinking enough fluids. Hydrate with an oral rehydration solution that you can purchase at pharmacies, retail stores, and online. You can prepare an oral rehydration solution at home by mixing the following ingredients together:   - tsp table salt.   tsp baking soda.   tsp salt substitute containing potassium chloride.  1  tablespoons sugar.  1 L (34 oz) of water.  Certain foods and beverages may increase the speed at which food moves through the gastrointestinal (GI) tract. These foods and beverages should be avoided and include:  Caffeinated and alcoholic beverages.  High-fiber foods, such as raw fruits and vegetables, nuts, seeds, and whole grain breads and cereals.  Foods and beverages sweetened with sugar alcohols, such as xylitol, sorbitol, and mannitol.  Some foods may be well tolerated and may help thicken stool  including:  Starchy foods, such as rice, toast, pasta, low-sugar cereal, oatmeal, grits, baked potatoes, crackers, and bagels.  Bananas.  Applesauce.  Add probiotic-rich foods to help increase healthy bacteria in the GI tract, such as yogurt and fermented milk products.  Wash your hands well after each diarrhea episode.  Only take over-the-counter or prescription medicines as directed by your caregiver.  Take a warm bath to relieve any burning or pain from frequent diarrhea episodes. SEEK IMMEDIATE MEDICAL CARE IF:   You are unable to keep fluids down.  You have persistent vomiting.  You have blood in your stool, or your stools are black and tarry.  You do not urinate in 6-8 hours, or there is only a small amount of very dark urine.  You have abdominal pain that increases or localizes.  You have weakness, dizziness, confusion, or light-headedness.  You have a severe headache.  Your diarrhea gets worse or does not get better.  You have a fever or persistent symptoms for more than 2-3 days.  You have a fever and your symptoms suddenly get worse. MAKE SURE YOU:   Understand these instructions.  Will watch your condition.  Will get help right away if you are not doing well or get worse. Document Released: 08/13/2002 Document Revised: 01/07/2014 Document Reviewed: 04/30/2012 ExitCare Patient Information 2015 ExitCare, LLC. This information is not intended to replace advice given to you by your health care provider. Make sure you discuss any   questions you have with your health care provider.  Emergency Department Resource Guide 1) Find a Doctor and Pay Out of Pocket Although you won't have to find out who is covered by your insurance plan, it is a good idea to ask around and get recommendations. You will then need to call the office and see if the doctor you have chosen will accept you as a new patient and what types of options they offer for patients who are self-pay.  Some doctors offer discounts or will set up payment plans for their patients who do not have insurance, but you will need to ask so you aren't surprised when you get to your appointment.  2) Contact Your Local Health Department Not all health departments have doctors that can see patients for sick visits, but many do, so it is worth a call to see if yours does. If you don't know where your local health department is, you can check in your phone book. The CDC also has a tool to help you locate your state's health department, and many state websites also have listings of all of their local health departments.  3) Find a [facility] If your illness is not likely to be very severe or complicated, you may want to try a walk in clinic. These are popping up all over the country in pharmacies, drugstores, and shopping centers. They're usually staffed by nurse practitioners or physician assistants that have been trained to treat common illnesses and complaints. They're usually fairly quick and inexpensive. However, if you have serious medical issues or chronic medical problems, these are probably not your best option.  No Primary Care Doctor: - Call Health Connect at  [phone] - they can help you locate a primary care doctor that  accepts your insurance, provides certain services, etc. - Physician Referral Service- [phone]  Chronic Pain Problems: Organization         Address  Phone   Notes  [facility]  [phone] Patients need to be referred by their primary care doctor.   Medication Assistance: Organization         Address  Phone   Notes  [facility] Medication [facility] [address] [place], [place] [address] [phone] --Must be a resident of [facility] -- Must have NO insurance coverage whatsoever (no Medicaid/ Medicare, etc.) -- The pt. MUST have a primary care doctor that directs their care regularly and follows them in the  community   MedAssist  [phone]   [employer]  [phone]    Agencies that provide inexpensive medical care: Organization         Address  Phone   Notes  [place]  [phone]   [facility]    [phone]   [facility] [place], [place] [address] [phone]   [place] [address], [place] [phone]   Planned Parenthood    [phone]   [facility]    [phone]   [place] [address], [place] Phone:  [phone], Fax:  [phone] Hours of Operation:  9 am - 6 pm, M-F.  Also accepts Medicaid/Medicare and self-pay.  [facility] for [place], [address], [place] Phone: [phone], Fax: [phone]. Hours of Operation:  8:30 am - 5:30 pm, M-F.  Also accepts Medicaid and self-pay.  [facility] [place] [address] [place] Phone: [phone]   [place], [place], [place] [phone], Ext. [phone] Mondays & Thursdays: 7-9 AM.  First 15 patients are seen on a first come, first serve basis.    [place] Providers:  Organization         Address  Phone   Notes  [facility] [address], [place] [phone] Also accepts self-pay patients.  [facility] [address], [place]  [phone]   [place], [address], [place] [phone]   [facility] [address] [place] [phone]   Lucianne Lei [address], [place]   [phone] Only accepts [place] Access [place] patients after they have their name applied to their card.   Self-Pay (no insurance) in [facility]:  Organization         Address  Phone   Notes  Sickle Cell Patients, [facility]  Internal [facility] [phone]   [facility] [place] [phone]   [facility] [place]  [place], [place], [place] [phone]   [facility]/Dr. Osei-Bonsu  [address] [place] or [address], [place] [phone] Phone number for both [place] and [place] locations is the same.  Urgent Medical and [facility] [address], [place] [phone]   [facility] [address] [place] or [address] [phone]   [facility] [address] [place] [phone], phone; [phone], fax Sees patients 1st and 3rd Saturday of every month.  Must not qualify for public or private insurance (i.e. Medicaid, Medicare, [place] Health Choice, Veterans' Benefits)  Household income should be no more than 200% of the poverty level The clinic cannot treat you if you are pregnant or think you are pregnant  Sexually transmitted diseases are not treated at the clinic.    Dental Care: Organization         Address  Phone  Notes  [facility] Department of [facility] [place] [phone] Accepts children up to age 72 who are enrolled in [place] or [place]; pregnant women with a Medicaid card; and children who have applied for Medicaid or [place] Health Choice, but were declined, whose parents can pay a reduced fee at time of service.  [facility] of [facility]  [address], [place] [phone] Accepts children up to age 22 who are enrolled in [place] or [place]; pregnant women with a Medicaid card; and children who have applied for Medicaid or [place] Health Choice, but were declined, whose parents can pay a reduced fee at time of service.  [facility] Access PRO[facility]  [place] [phone] Patients are seen by appointment only. Walk-ins are not accepted. [place] will see patients 17 years of age and older. Monday - Tuesday (8am-5pm) Most Wednesdays (8:30-5pm) $30 per visit, cash only  [facility] Adult [facility] Access PROGRAM  [address], [facility] [phone] Patients are seen by appointment only. Walk-ins are not accepted. [place]  will see patients 37 years of age and older. One Wednesday Evening (Monthly: Volunteer Based).  $30 per visit, cash only  [place]  [phone] for adults; Children under age 69, call Graduate Pediatric Dentistry at [phone]. Children aged 84-14, please call [phone] to request a pediatric application.  Dental services are provided in all areas of dental care including fillings, crowns and bridges, complete and partial dentures, implants, gum treatment, root canals, and extractions. Preventive care is also provided. Treatment is provided to both adults and children. Patients are selected via a lottery and there is often a waiting list.   [facility] [address], [place]  [phone] [email or web address]   [facility] [address] [place], [place] [phone], Ext. [phone] Second and Fourth Thursday of each month, opens at 6:30 AM; Clinic ends at 9 AM.  Patients are seen on a first-come first-served basis, and a limited number are seen during each clinic.   [facility]  [address] [place], [place] [phone]   Eligibility Requirements You must have lived in [place], [place], or [place] counties for at least the last three months.   You cannot be eligible for state or federal sponsored [employer], including [employer], [place], or [employer].   You generally cannot be eligible for healthcare insurance through your employer.    How to apply: Eligibility screenings are held every  Tuesday and Wednesday afternoon from 1:00 pm until 4:00 pm. You do not need an appointment for the interview!  [facility] [address] [place], [place]   [place]  [place] Department  [place]  [phone]    Behavioral Health Resources in the Community: Intensive Outpatient Programs Organization         Address  Phone  Notes  [place]. [address] [place], [place] [phone]   [facility] Outpatient [address], [place], [place]   ADS: Alcohol & Drug S[facility]cs [address], [place], [place]   [place] [address]  [place], [place] or [phone]   Substance Abuse Resources Organization         Address  Phone  Notes  Alcohol and Drug Services  [phone]   [place]  [phone]   [place]   Chinita Pester  [phone]   Residential & Outpatient Substance Abuse Program  [phone]   Psychological Services Organization         Address  Phone  Notes  [facility] [place]  [place]  [phone]   [place] [address], [place] or [phone]    Mobile Crisis Teams Organization         Address  Phone  Notes  [facility], Mobile Crisis Care Unit  [phone]   [facility]  [address] [place], [place]   [place] [address] [place] [phone]    Self-Help/Support Groups Organization         Address  Phone             Notes  [place]. of [place] - variety of support groups  Ryan Call for more information  Narcotics Anonymous (NA), [facility] [address],  [place]  2 meetings at this location    Residential Treatment Programs Organization         Address  Phone  Notes  [facility] [address],    [place]  [phone]   [facility]  [address] [place] [address], [place], [place]   [place], [place] [phone] Admissions: 8am-3pm M-F  Incentives Substance [place] [address],    [place], [place] [phone]   [facility] [address] [place], [place], [place]   [facility] [address],  [place], [employer]   [facility] Programs - Intensive Outpatient [address] Kristeen [address], [place], Campus   [facility] ([place] [place].,  [place], [place] [phone] or [phone]   [facility] Services (RTS) [address] [place], [place] Accepts Medicaid  [facility] [place] [address],  [place] [phone] Substance Abuse/Addiction Treatment   [facility] Organization         Address  Phone  Notes  [facility]  [phone]   Domenic Schwab, PhD [address] [place], [place]   [phone] or [phone]   [place], [place] [phone]   [facility] [address] [place], [place] [phone] Insurance/Medicaid/sponsorship through [facility] [address], [address]                                    [place], [place] [phone] [place] [address], [place] [phone]    Dr. Adele Schilder  [phone]   [facility] Dept. 1) [address] [place] 2) [place] 3)  [place] [address], [place] [phone]  [phone]   [place] [phone] or [phone] (After  Hours)

## 2015-02-12 ENCOUNTER — Emergency Department (HOSPITAL_COMMUNITY)
Admission: EM | Admit: 2015-02-12 | Discharge: 2015-02-12 | Disposition: A | Discharge status: Home / Self Care | Payer: Self-pay | Attending: Emergency Medicine | Admitting: Emergency Medicine

## 2015-02-12 ENCOUNTER — Encounter (HOSPITAL_COMMUNITY): Payer: Self-pay | Admitting: *Deleted

## 2015-02-12 DIAGNOSIS — Z79899 Other long term (current) drug therapy: Secondary | ICD-10-CM | POA: Insufficient documentation

## 2015-02-12 DIAGNOSIS — R197 Diarrhea, unspecified: Secondary | ICD-10-CM | POA: Insufficient documentation

## 2015-02-12 DIAGNOSIS — R112 Nausea with vomiting, unspecified: Secondary | ICD-10-CM | POA: Insufficient documentation

## 2015-02-12 LAB — I-STAT CHEM 8, ED
BUN: 19 mg/dL (ref 6–20)
Calcium, Ion: 1.06 mmol/L — ABNORMAL LOW (ref 1.12–1.23)
Chloride: 110 mmol/L (ref 101–111)
Creatinine, Ser: 1.3 mg/dL — ABNORMAL HIGH (ref 0.61–1.24)
GLUCOSE: 114 mg/dL — AB (ref 65–99)
HCT: 47 % (ref 39.0–52.0)
HEMOGLOBIN: 16 g/dL (ref 13.0–17.0)
Potassium: 4.4 mmol/L (ref 3.5–5.1)
Sodium: 140 mmol/L (ref 135–145)
TCO2: 21 mmol/L (ref 0–100)

## 2015-02-12 MED ORDER — ONDANSETRON 4 MG PO TBDP
8.0000 mg | ORAL_TABLET | Freq: Once | ORAL | Status: AC
  Administered 2015-02-12: 8 mg via ORAL
  Filled 2015-02-12: qty 2

## 2015-02-12 MED ORDER — ONDANSETRON 4 MG PO TBDP: ORAL_TABLET | ORAL | Status: DC

## 2015-02-12 NOTE — ED Notes (Signed)
Pt comfortable with discharge and follow up instructions. Prescriptions x1. Pt tolerating PO fluids without issue. Prescriptions x1.

## 2015-02-12 NOTE — ED Notes (Signed)
Pt reports abd pain and n/v/d since yesterday morning. No relief with phenergan.

## 2015-02-12 NOTE — ED Provider Notes (Signed)
CSN: [id number]     Arrival date & time 02/12/15  0736 History   First MD Initiated Contact with Patient 02/12/15 [phone]     Chief Complaint  Patient presents with  . Emesis  . Diarrhea     (Consider location/radiation/quality/duration/timing/severity/associated sxs/prior Treatment) HPI  49 year old male presents with nausea, vomiting, and diarrhea since yesterday morning. He states he went to the bathroom and has started having multiple episodes of emesis. Has been having loose watery stools, a total of 3. Denies any blood in vomit or stools. This morning he vomited once when he tried to drink and so he presented to the ER. He has tried leftover Phenergan twice without relief. He denies any abdominal pain. The diarrhea seems to have resolved. No fevers.   History reviewed. No pertinent past medical history. History reviewed. No pertinent past surgical history. History reviewed. No pertinent family history. History  Substance Use Topics  . Smoking status: Never Smoker   . Smokeless tobacco: Not on file  . Alcohol Use: No    Review of Systems  Constitutional: Negative for fever.  Gastrointestinal: Positive for nausea, vomiting and diarrhea. Negative for abdominal pain and blood in stool.      Allergies  Review of patient's allergies indicates no known allergies.  Home Medications   Prior to Admission medications   Medication Sig Start Date End Date Taking? Authorizing Provider  Ibuprofen-Diphenhydramine Cit (ADVIL PM PO) Take 1 tablet by mouth daily as needed (for pain).   Yes Historical Provider, MD  promethazine (PHENERGAN) 25 MG tablet Take 25 mg by mouth every 6 (six) hours as needed for nausea or vomiting.   Yes Historical Provider, MD   BP 141/117 mmHg  Pulse 82  Resp 16  Ht 6\' 1"  (1.854 m)  Wt 135 lb (61.236 kg)  BMI 17.82 kg/m2 Physical Exam  Constitutional: He is oriented to person, place, and time. He appears well-developed and well-nourished.  HENT:  Head:  Normocephalic and atraumatic.  Right Ear: External ear normal.  Left Ear: External ear normal.  Nose: Nose normal.  Mouth/Throat: Oropharynx is clear and moist.  Eyes: Right eye exhibits no discharge. Left eye exhibits no discharge.  Neck: Neck supple.  Cardiovascular: Normal rate, regular rhythm, normal heart sounds and intact distal pulses.   Pulmonary/Chest: Effort normal and breath sounds normal.  Abdominal: Soft. He exhibits no distension. There is no tenderness.  Musculoskeletal: He exhibits no edema.  Neurological: He is alert and oriented to person, place, and time.  Skin: Skin is warm and dry.  Nursing note and vitals reviewed.   ED Course  Procedures (including critical care time) Labs Review Labs Reviewed  I-STAT CHEM 8, ED - Abnormal; Notable for the following:    Creatinine, Ser 1.30 (*)    Glucose, Bld 114 (*)    Calcium, Ion 1.06 (*)    All other components within normal limits    Imaging Review No results found.   EKG Interpretation None      MDM   Final diagnoses:  Nausea vomiting and diarrhea    Patient is well-appearing and has benign vital signs. Appears well hydrated with moist mucous membranes. No current vomiting and was able to drink fluids without difficulty after ODT Zofran. No abdominal tenderness. Electrolytes are unremarkable, has mildly increased creatinine but is not different from a couple months ago. Will continue to drink fluids at home and recommend follow-up with a PCP.    Sherwood Gambler, MD 02/12/15 [phone]

## 2015-02-14 ENCOUNTER — Emergency Department (HOSPITAL_COMMUNITY)
Admission: EM | Admit: 2015-02-14 | Discharge: 2015-02-14 | Disposition: A | Discharge status: Home / Self Care | Payer: Self-pay | Attending: Emergency Medicine | Admitting: Emergency Medicine

## 2015-02-14 ENCOUNTER — Encounter (HOSPITAL_COMMUNITY): Payer: Self-pay | Admitting: Emergency Medicine

## 2015-02-14 DIAGNOSIS — R112 Nausea with vomiting, unspecified: Secondary | ICD-10-CM | POA: Insufficient documentation

## 2015-02-14 DIAGNOSIS — R7989 Other specified abnormal findings of blood chemistry: Secondary | ICD-10-CM | POA: Insufficient documentation

## 2015-02-14 LAB — COMPREHENSIVE METABOLIC PANEL
ALT: 18 U/L (ref 17–63)
AST: 25 U/L (ref 15–41)
Albumin: 4.9 g/dL (ref 3.5–5.0)
Alkaline Phosphatase: 64 U/L (ref 38–126)
Anion gap: 13 (ref 5–15)
BUN: 34 mg/dL — ABNORMAL HIGH (ref 6–20)
CO2: 23 mmol/L (ref 22–32)
Calcium: 9.4 mg/dL (ref 8.9–10.3)
Chloride: 101 mmol/L (ref 101–111)
Creatinine, Ser: 1.06 mg/dL (ref 0.61–1.24)
GFR calc Af Amer: >60 mL/min (ref >60)
GFR calc non Af Amer: >60 mL/min (ref >60)
Glucose, Bld: 105 mg/dL — ABNORMAL HIGH (ref 65–99)
Potassium: 4.3 mmol/L (ref 3.5–5.1)
Sodium: 137 mmol/L (ref 135–145)
Total Bilirubin: 1.2 mg/dL (ref 0.3–1.2)
Total Protein: 9.1 g/dL — ABNORMAL HIGH (ref 6.5–8.1)

## 2015-02-14 LAB — CBC WITH DIFFERENTIAL/PLATELET
Basophils Absolute: 0 10*3/uL (ref 0.0–0.1)
Basophils Relative: 0 % (ref 0–1)
Eosinophils Absolute: 0 10*3/uL (ref 0.0–0.7)
Eosinophils Relative: 0 % (ref 0–5)
HCT: 44.3 % (ref 39.0–52.0)
Hemoglobin: 15 g/dL (ref 13.0–17.0)
Lymphocytes Relative: 32 % (ref 12–46)
Lymphs Abs: 3.3 10*3/uL (ref 0.7–4.0)
MCH: 30.6 pg (ref 26.0–34.0)
MCHC: 33.9 g/dL (ref 30.0–36.0)
MCV: 90.4 fL (ref 78.0–100.0)
Monocytes Absolute: 1 10*3/uL (ref 0.1–1.0)
Monocytes Relative: 10 % (ref 3–12)
Neutro Abs: 6 10*3/uL (ref 1.7–7.7)
Neutrophils Relative %: 58 % (ref 43–77)
Platelets: 245 10*3/uL (ref 150–400)
RBC: 4.9 MIL/uL (ref 4.22–5.81)
RDW: 12.6 % (ref 11.5–15.5)
WBC: 10.3 10*3/uL (ref 4.0–10.5)

## 2015-02-14 MED ORDER — ONDANSETRON HCL 4 MG/2ML IJ SOLN
4.0000 mg | Freq: Once | INTRAMUSCULAR | Status: AC
  Administered 2015-02-14: 4 mg via INTRAVENOUS
  Filled 2015-02-14: qty 2

## 2015-02-14 MED ORDER — SODIUM CHLORIDE 0.9 % IV SOLN
1000.0000 mL | Freq: Once | INTRAVENOUS | Status: AC
  Administered 2015-02-14: 1000 mL via INTRAVENOUS

## 2015-02-14 MED ORDER — KETOROLAC TROMETHAMINE 30 MG/ML IJ SOLN
30.0000 mg | Freq: Once | INTRAMUSCULAR | Status: AC
  Administered 2015-02-14: 30 mg via INTRAVENOUS
  Filled 2015-02-14: qty 1

## 2015-02-14 NOTE — ED Provider Notes (Signed)
CSN: [id number]     Arrival date & time 02/14/15  1414 History   First MD Initiated Contact with Patient 02/14/15 1507     Chief Complaint  Patient presents with  . Emesis     (Consider location/radiation/quality/duration/timing/severity/associated sxs/prior Treatment) The history is provided by medical records and the patient.    This is a 49 y.o. M with no significant past medical history presenting to the ED for nausea and vomiting. He states this began on Tuesday with episode of diarrhea followed by vomiting, he was seen in the ED on Wednesday and was prescribed oral Zofran. He states he was not given IV fluids but was told his lab work was normal.  He states he has been trying to drink fluids at home, but is continued vomiting despite taking the prescribed Zofran. He denies any fever or chills. He denies any further diarrhea.  Patient has no history of GI issues. No prior abdominal surgeries. Patient states every 6 months he seems to get a "GI bug" but is not sure why. He denies any recent sick contacts, abnormal food intake, or travel. He has never seen a gastroenterologist.  VSS.  History reviewed. No pertinent past medical history. History reviewed. No pertinent past surgical history. No family history on file. History  Substance Use Topics  . Smoking status: Never Smoker   . Smokeless tobacco: Not on file  . Alcohol Use: No    Review of Systems  Gastrointestinal: Positive for nausea and vomiting.  All other systems reviewed and are negative.     Allergies  Review of patient's allergies indicates no known allergies.  Home Medications   Prior to Admission medications   Medication Sig Start Date End Date Taking? Authorizing Provider  ondansetron (ZOFRAN ODT) 4 MG disintegrating tablet 4mg  ODT q4 hours prn nausea/vomit 02/12/15  Yes Sherwood Gambler, MD  Ibuprofen-Diphenhydramine Cit (ADVIL PM PO) Take 1 tablet by mouth daily as needed (for pain).    Historical Provider, MD   promethazine (PHENERGAN) 25 MG tablet Take 25 mg by mouth every 6 (six) hours as needed for nausea or vomiting.    Historical Provider, MD   BP 137/115 mmHg  Pulse 79  Temp(Src) 98.1 F (36.7 C) (Oral)  Resp 118  SpO2 100% Physical Exam  Constitutional: He is oriented to person, place, and time. He appears well-developed and well-nourished. No distress.  HENT:  Head: Normocephalic and atraumatic.  Mouth/Throat: Oropharynx is clear and moist.  Mildly dry mucous membranes  Eyes: Conjunctivae and EOM are normal. Pupils are equal, round, and reactive to light.  Neck: Normal range of motion. Neck supple.  Cardiovascular: Normal rate, regular rhythm and normal heart sounds.   Pulmonary/Chest: Effort normal and breath sounds normal. No respiratory distress. He has no wheezes.  Abdominal: Soft. Bowel sounds are normal. There is no tenderness. There is no guarding.  Abdomen soft, non-distended, no focal tenderness or peritoneal signs  Musculoskeletal: Normal range of motion. He exhibits no edema.  Neurological: He is alert and oriented to person, place, and time.  Skin: Skin is warm and dry. He is not diaphoretic.  Psychiatric: He has a normal mood and affect.  Nursing note and vitals reviewed.   ED Course  Procedures (including critical care time) Labs Review Labs Reviewed  COMPREHENSIVE METABOLIC PANEL - Abnormal; Notable for the following:    Glucose, Bld 105 (*)    BUN 34 (*)    Total Protein 9.1 (*)    All other components  within normal limits  CBC WITH DIFFERENTIAL/PLATELET    Imaging Review No results found.   EKG Interpretation None      MDM   Final diagnoses:  Nausea and vomiting, vomiting of unspecified type   50 y.o. male here with nausea and vomiting. He was seen in the ED for the same 2 days ago and discharged home with Zofran. He reports continued symptoms. Patient is afebrile, nontoxic. His abdominal exam is benign. His mucous membranes do appear mildly  dry, but he does not appear overly dehydrated. Labwork is reassuring, BUN mildly elevated. Patient was given IV fluids and Zofran with resolution of symptoms. He has had no active vomiting here in the ED and has tolerated PO without difficulty.  Will d/c home with supportive care, continue zofran PRN, encouraged fluids.  FU with [facility].  Discussed plan with patient, he/she acknowledged understanding and agreed with plan of care.  Return precautions given for new or worsening symptoms.  [name], PA-C 02/14/15 [place], MD 02/15/15 [phone]

## 2015-02-14 NOTE — ED Notes (Signed)
Per pt, states he has been vomiting since Tuesday-went to [facility] and they treated him with a cup of ice and Zofran-sent home home with a prescription of just 2 pills

## 2015-02-14 NOTE — Discharge Instructions (Signed)
Continue drinking fluids to make sure you stay hydrated.  May want to start with bland diet and progress back to normal as tolerated.  Continue zofran as needed. Follow-up with the [facility]-- call to make appt. Return here as needed for new concerns.

## 2015-02-14 NOTE — ED Notes (Signed)
Went to draw blood and was advised that the RN was going to do an IV

## 2015-02-14 NOTE — ED Notes (Signed)
Pt reports feeling much better.  Denies any vomiting after zofran.

## 2015-02-17 ENCOUNTER — Ambulatory Visit: Payer: Self-pay | Attending: Family Medicine | Admitting: Family Medicine

## 2015-02-17 VITALS — BP 146/101 | HR 86 | Temp 97.9°F | Resp 20 | Ht 73.0 in | Wt 133.6 lb

## 2015-02-17 DIAGNOSIS — R112 Nausea with vomiting, unspecified: Secondary | ICD-10-CM

## 2015-02-17 DIAGNOSIS — I10 Essential (primary) hypertension: Secondary | ICD-10-CM

## 2015-02-17 LAB — POCT URINALYSIS DIPSTICK
GLUCOSE UA: NEGATIVE
KETONES UA: 15
LEUKOCYTES UA: NEGATIVE
NITRITE UA: NEGATIVE
Protein, UA: 30
Spec Grav, UA: >=1.03
UROBILINOGEN UA: 0.2
pH, UA: 5.5

## 2015-02-17 MED ORDER — SODIUM CHLORIDE 0.9 % IV SOLN: 1000.0000 mL | Freq: Once | INTRAVENOUS | Status: DC

## 2015-02-17 MED ORDER — LISINOPRIL 20 MG PO TABS: 20.0000 mg | ORAL_TABLET | Freq: Every day | ORAL | Status: DC

## 2015-02-17 NOTE — Progress Notes (Signed)
Patient ID: Lee Greer, male   DOB: 01/15/1966, 49 y.o.   MRN: [id number]   Lee Greer, is a 49 y.o. male  [id number]:[id number]  [id number]  DOB - 08/09/1966  CC:  Chief Complaint  Patient presents with  . Follow-up  . Emesis       HPI: Lee Greer is a 49 y.o. male here today to establish medical care and to follow-up on nausea a vomiting. He was seen in ED on 6/8 and again on 6/10 for nausea and vomiting. He receive no definitive reason for the symptoms. He continues to have nausea a vomiting and states he feels dehydrated. He has had no vomiting today but has continued to feel nauseated. His last zofran was last night. He request fluids. He denies chronic illness and not been on long term medications for any condition. He does report that he was told his BP was elevated in the ED. The record shows in was 137/115 on one occasion and 141/117 on the second. It is elevated again today at 146/101  No Known Allergies History reviewed. No pertinent past medical history. Current Outpatient Prescriptions on File Prior to Visit  Medication Sig Dispense Refill  . Ibuprofen-Diphenhydramine Cit (ADVIL PM PO) Take 1 tablet by mouth daily as needed (for pain).    . ondansetron (ZOFRAN ODT) 4 MG disintegrating tablet 4mg  ODT q4 hours prn nausea/vomit (Patient taking differently: Take 4 mg by mouth every 4 (four) hours as needed for nausea or vomiting. 4mg  ODT q4 hours prn nausea/vomit) 10 tablet 0  . promethazine (PHENERGAN) 25 MG tablet Take 25 mg by mouth every 6 (six) hours as needed for nausea or vomiting.     No current facility-administered medications on file prior to visit.   History reviewed. No pertinent family history. History   Social History  . Marital Status: Single    Spouse Name: N/A  . Number of Children: N/A  . Years of Education: N/A   Occupational History  . Not on file.   Social History Main Topics  . Smoking status: Never Smoker   . Smokeless tobacco:  Not on file  . Alcohol Use: No  . Drug Use: No  . Sexual Activity: Not on file   Other Topics Concern  . Not on file   Social History Narrative    Review of Systems: Constitutional: Negative for fever, chills, appetite change, weight loss,  fatigue. HENT: Negative for ear pain, ear discharge.nose bleeds Eyes: Negative for pain, discharge, redness, itching and visual disturbance. Neck: Negative for pain, stiffness Respiratory: Negative for cough, shortness of breath,   Cardiovascular: Negative for chest pain, palpitations and leg swelling. Gastrointestinal: Negative for abdominal distention. Positive for  abdominal pain, nausea, vomiting, diarrhea,  Genitourinary: Negative for dysuria, urgency, frequency, hematuria, flank pain,  Musculoskeletal: Negative for back pain, joint pain, joint  swelling, arthralgia and gait problem.Negative for weakness. Neurological: Negative for dizziness, tremors, seizures, syncope,   light-headedness, numbness and headaches.  Hematological: Negative for easy bruising or bleeding Psychiatric/Behavioral: Negative for depression, anxiety, decreased concentration, confusion   Objective:   Filed Vitals:   02/17/15 1149  BP: 151/120  Pulse: 86  Temp: 97.9 F (36.6 C)  Resp: 20    Physical Exam: Constitutional: Patient appears well-developed and well-nourished. No distress. HENT: Normocephalic, atraumatic, External right and left ear normal. Oropharynx is clear and moist. Eyes moist. Eyes: Conjunctivae and EOM are normal. PERRLA, no scleral icterus. Neck: Normal ROM. Neck supple. No lymphadenopathy, No thyromegaly. CVS:  RRR, S1/S2 +, no murmurs, no gallops, no rubs Pulmonary: Effort and breath sounds normal, no stridor, rhonchi, wheezes, rales.  Abdominal: Soft. Normoactive BS,, no distension, rebound or guarding.There is generalized tenderness  Musculoskeletal: Normal range of motion. No edema and no tenderness.  Neuro: Alert.Normal muscle tone  coordination. Non-focal Skin: Skin is warm and dry. No rash noted. Not diaphoretic. No erythema. No pallor.No tenting Psychiatric: Normal mood and affect. Behavior, judgment, thought content normal. Specific gravity:  1.030, urine is dark.  Lab Results  Component Value Date   WBC 10.3 02/14/2015   HGB 15.0 02/14/2015   HCT 44.3 02/14/2015   MCV 90.4 02/14/2015   PLT 245 02/14/2015   Lab Results  Component Value Date   CREATININE 1.06 02/14/2015   BUN 34* 02/14/2015   NA 137 02/14/2015   K 4.3 02/14/2015   CL 101 02/14/2015   CO2 23 02/14/2015    No results found for: HGBA1C Lipid Panel  No results found for: CHOL, TRIG, HDL, CHOLHDL, VLDL, LDLCALC     Assessment and plan:6   Nausea/Vomiting - I have recommended he use his Zofran every 4-6 hours for 24 hours, then for next 24 hours only if needed. F/U if symptoms persist for more than 48 hours. - Frequent small amounts of fluids.  Mild dehydration as evidenced by SG of 1.030 - 1000 cc NS here  Hypertension - Lisinopril 20 mg, one po q day, # 90 with 3 refills.   No Follow-up on file.  The patient was given clear instructions to go to ER or return to medical center if symptoms don't improve, worsen or new problems develop. The patient verbalized understanding. The patient was told to call to get lab results if they haven't heard anything in the next week.     This note has been created with Surveyor, quantity. Any transcriptional errors are unintentional.    Micheline Chapman, MSN, FNP-BC [place], [place]   02/17/2015, 12:04 PM

## 2015-02-17 NOTE — Progress Notes (Signed)
Patient here for ED follow up for diarrhea and vomiting. Patient would also like to establish care with a PCP. Patient reports that he went to the hospital on Friday for diarrhea and vomiting, but they did not do anything for him so he went to [facility] on Saturday and they gave him fluids and zofran. Patient reports that he is still vomiting and the zofran is not helping. Patient reported that diarrhea and vomiting started last Tuesday, but took promethazine to see if it would help but it did not. Patient has pain rated at a 7. Patient reports that the pain is located on his right abdomen and occurs when he dry heeves.

## 2015-02-17 NOTE — Patient Instructions (Addendum)
Take zofran around the clock as per bottle instructions for next 48 hours. Drink small amounts of fluids frequently. Follow-up in 48 hours if not improved. Watch salt in diet. Return for nurse visit in 2 weeks for BP check and further information on hypertension.

## 2015-08-31 ENCOUNTER — Emergency Department (HOSPITAL_COMMUNITY)
Admission: EM | Admit: 2015-08-31 | Discharge: 2015-08-31 | Disposition: A | Discharge status: Home / Self Care | Payer: Self-pay | Attending: Emergency Medicine | Admitting: Emergency Medicine

## 2015-08-31 ENCOUNTER — Encounter (HOSPITAL_COMMUNITY): Payer: Self-pay | Admitting: Emergency Medicine

## 2015-08-31 DIAGNOSIS — K529 Noninfective gastroenteritis and colitis, unspecified: Secondary | ICD-10-CM | POA: Insufficient documentation

## 2015-08-31 DIAGNOSIS — E860 Dehydration: Secondary | ICD-10-CM | POA: Insufficient documentation

## 2015-08-31 DIAGNOSIS — R112 Nausea with vomiting, unspecified: Secondary | ICD-10-CM

## 2015-08-31 DIAGNOSIS — Z79899 Other long term (current) drug therapy: Secondary | ICD-10-CM | POA: Insufficient documentation

## 2015-08-31 DIAGNOSIS — R197 Diarrhea, unspecified: Secondary | ICD-10-CM

## 2015-08-31 LAB — COMPREHENSIVE METABOLIC PANEL
ALBUMIN: 5.1 g/dL — AB (ref 3.5–5.0)
ALT: 18 U/L (ref 17–63)
ANION GAP: 14 (ref 5–15)
AST: 23 U/L (ref 15–41)
Alkaline Phosphatase: 79 U/L (ref 38–126)
BILIRUBIN TOTAL: 0.9 mg/dL (ref 0.3–1.2)
BUN: 23 mg/dL — AB (ref 6–20)
CHLORIDE: 103 mmol/L (ref 101–111)
CO2: 23 mmol/L (ref 22–32)
Calcium: 9.8 mg/dL (ref 8.9–10.3)
Creatinine, Ser: 1.03 mg/dL (ref 0.61–1.24)
GFR calc Af Amer: >60 mL/min (ref >60)
GFR calc non Af Amer: >60 mL/min (ref >60)
GLUCOSE: 110 mg/dL — AB (ref 65–99)
POTASSIUM: 3.7 mmol/L (ref 3.5–5.1)
SODIUM: 140 mmol/L (ref 135–145)
TOTAL PROTEIN: 9.3 g/dL — AB (ref 6.5–8.1)

## 2015-08-31 LAB — CBC WITH DIFFERENTIAL/PLATELET
BASOS ABS: 0 10*3/uL (ref 0.0–0.1)
BASOS PCT: 0 %
EOS ABS: 0 10*3/uL (ref 0.0–0.7)
EOS PCT: 0 %
HCT: 41.2 % (ref 39.0–52.0)
Hemoglobin: 14 g/dL (ref 13.0–17.0)
Lymphocytes Relative: 20 %
Lymphs Abs: 2.4 10*3/uL (ref 0.7–4.0)
MCH: 30.9 pg (ref 26.0–34.0)
MCHC: 34 g/dL (ref 30.0–36.0)
MCV: 90.9 fL (ref 78.0–100.0)
MONO ABS: 0.8 10*3/uL (ref 0.1–1.0)
MONOS PCT: 7 %
NEUTROS ABS: 8.7 10*3/uL — AB (ref 1.7–7.7)
Neutrophils Relative %: 73 %
PLATELETS: 234 10*3/uL (ref 150–400)
RBC: 4.53 MIL/uL (ref 4.22–5.81)
RDW: 13.3 % (ref 11.5–15.5)
WBC: 11.9 10*3/uL — ABNORMAL HIGH (ref 4.0–10.5)

## 2015-08-31 LAB — URINALYSIS, ROUTINE W REFLEX MICROSCOPIC
Bilirubin Urine: NEGATIVE
Glucose, UA: NEGATIVE mg/dL
KETONES UR: 15 mg/dL — AB
NITRITE: NEGATIVE
PH: 5 (ref 5.0–8.0)
PROTEIN: 30 mg/dL — AB
Specific Gravity, Urine: 1.029 (ref 1.005–1.030)

## 2015-08-31 LAB — URINE MICROSCOPIC-ADD ON

## 2015-08-31 LAB — LIPASE, BLOOD: Lipase: 21 U/L (ref 11–51)

## 2015-08-31 MED ORDER — ONDANSETRON HCL 8 MG PO TABS: 8.0000 mg | ORAL_TABLET | Freq: Three times a day (TID) | ORAL | Status: DC | PRN

## 2015-08-31 MED ORDER — MORPHINE SULFATE (PF) 4 MG/ML IV SOLN
4.0000 mg | Freq: Once | INTRAVENOUS | Status: AC
  Administered 2015-08-31: 4 mg via INTRAVENOUS
  Filled 2015-08-31: qty 1

## 2015-08-31 MED ORDER — SODIUM CHLORIDE 0.9 % IV BOLUS (SEPSIS)
1000.0000 mL | Freq: Once | INTRAVENOUS | Status: AC
  Administered 2015-08-31: 1000 mL via INTRAVENOUS

## 2015-08-31 MED ORDER — PANTOPRAZOLE SODIUM 40 MG IV SOLR
40.0000 mg | Freq: Once | INTRAVENOUS | Status: AC
  Administered 2015-08-31: 40 mg via INTRAVENOUS
  Filled 2015-08-31: qty 40

## 2015-08-31 MED ORDER — ONDANSETRON HCL 4 MG/2ML IJ SOLN
4.0000 mg | Freq: Once | INTRAMUSCULAR | Status: AC
  Administered 2015-08-31: 4 mg via INTRAVENOUS
  Filled 2015-08-31: qty 2

## 2015-08-31 MED ORDER — RANITIDINE HCL 150 MG PO TABS: 150.0000 mg | ORAL_TABLET | Freq: Two times a day (BID) | ORAL | Status: DC

## 2015-08-31 NOTE — ED Provider Notes (Signed)
CSN: [id number]     Arrival date & time 08/31/15  0120 History   First MD Initiated Contact with Patient 08/31/15 [phone]     Chief Complaint  Patient presents with  . Nausea     (Consider location/radiation/quality/duration/timing/severity/associated sxs/prior Treatment) HPI Comments: Lee Greer is a 49 y.o. male with a PMHx of recurrent N/V, who presents to the ED with complaints of gradual onset nausea, vomiting, diarrhea, and abdominal pain 1 day. He states he has had 4 episodes of nonbloody nonbilious emesis since yesterday, one episode of loose nonbloody diarrhea, and has had some 5/10 generalized nonradiating aching abdominal pain intermittently only with retching, worse with vomiting, and with no treatments tried prior to arrival. He states his abdomen doesn't hurt unless he's retching. He endorses chronic NSAID use.  He denies fevers, chills, CP, SOB, constipation, obstipation, melena, hematochezia, hematemesis, hematuria, dysuria, myalgias, arthralgias, numbness, tingling, weakness, recent travel, sick contacts, suspicious food intake, alcohol use, or prior abd surgeries. He states last time he had this it was a "stomach virus", chart review reveals this was in June 2016.   Patient is a 49 y.o. male presenting with vomiting. The history is provided by the patient. No language interpreter was used.  Emesis Severity:  Moderate Duration:  1 day Timing:  Constant Number of daily episodes:  4 Quality:  Stomach contents Progression:  Unchanged Chronicity:  Recurrent Recent urination:  Normal Relieved by:  None tried Worsened by:  Nothing tried Ineffective treatments:  Ice chips Associated symptoms: abdominal pain (only with vomiting) and diarrhea   Associated symptoms: no arthralgias, no chills, no fever and no myalgias   Risk factors: no alcohol use, no prior abdominal surgery, no sick contacts, no suspect food intake and no travel to endemic areas   Risk factors comment:   +NSAID use   History reviewed. No pertinent past medical history. History reviewed. No pertinent past surgical history. No family history on file. Social History  Substance Use Topics  . Smoking status: Never Smoker   . Smokeless tobacco: None  . Alcohol Use: No    Review of Systems  Constitutional: Negative for fever and chills.  Respiratory: Negative for shortness of breath.   Cardiovascular: Negative for chest pain.  Gastrointestinal: Positive for nausea, vomiting, abdominal pain (only with vomiting) and diarrhea. Negative for constipation and blood in stool.  Genitourinary: Negative for dysuria and hematuria.  Musculoskeletal: Negative for myalgias and arthralgias.  Skin: Negative for color change.  Allergic/Immunologic: Negative for immunocompromised state.  Neurological: Negative for weakness and numbness.  Psychiatric/Behavioral: Negative for confusion.   10 Systems reviewed and are negative for acute change except as noted in the HPI.    Allergies  Review of patient's allergies indicates no known allergies.  Home Medications   Prior to Admission medications   Medication Sig Start Date End Date Taking? Authorizing Provider  Ibuprofen-Diphenhydramine Cit (ADVIL PM PO) Take 1 tablet by mouth daily as needed (for pain).    Historical Provider, MD  lisinopril (PRINIVIL,ZESTRIL) 20 MG tablet Take 1 tablet (20 mg total) by mouth daily. 02/17/15   Micheline Chapman, NP  ondansetron (ZOFRAN ODT) 4 MG disintegrating tablet 4mg  ODT q4 hours prn nausea/vomit Patient taking differently: Take 4 mg by mouth every 4 (four) hours as needed for nausea or vomiting. 4mg  ODT q4 hours prn nausea/vomit 02/12/15   Sherwood Gambler, MD  promethazine (PHENERGAN) 25 MG tablet Take 25 mg by mouth every 6 (six) hours as needed for nausea  or vomiting.    Historical Provider, MD  sodium chloride 0.9 % infusion Inject 1,000 mLs into the vein once. 02/17/15   Micheline Chapman, NP   BP 134/96 mmHg  Pulse  77  Temp(Src) 98.1 F (36.7 C) (Oral)  Resp 18  Ht 6\' 1"  (1.854 m)  Wt 63.504 kg  BMI 18.47 kg/m2  SpO2 100% Physical Exam  Constitutional: He is oriented to person, place, and time. Vital signs are normal. He appears well-developed and well-nourished.  Non-toxic appearance. No distress.  Afebrile, nontoxic, NAD  HENT:  Head: Normocephalic and atraumatic.  Mouth/Throat: Oropharynx is clear and moist and mucous membranes are normal.  Eyes: Conjunctivae and EOM are normal. Right eye exhibits no discharge. Left eye exhibits no discharge.  Neck: Normal range of motion. Neck supple.  Cardiovascular: Normal rate, regular rhythm, normal heart sounds and intact distal pulses.  Exam reveals no gallop and no friction rub.   No murmur heard. Pulmonary/Chest: Effort normal and breath sounds normal. No respiratory distress. He has no decreased breath sounds. He has no wheezes. He has no rhonchi. He has no rales.  Abdominal: Soft. Normal appearance and bowel sounds are normal. He exhibits no distension. There is no tenderness. There is no rigidity, no rebound, no guarding, no CVA tenderness, no tenderness at McBurney's point and negative Murphy's sign.  Soft, NTND, +BS throughout, no r/g/r, neg murphy's, neg mcburney's, no CVA TTP   Musculoskeletal: Normal range of motion.  Neurological: He is alert and oriented to person, place, and time. He has normal strength. No sensory deficit.  Skin: Skin is warm, dry and intact. No rash noted.  Psychiatric: He has a normal mood and affect.  Nursing note and vitals reviewed.   ED Course  Procedures (including critical care time) Labs Review Labs Reviewed  CBC WITH DIFFERENTIAL/PLATELET - Abnormal; Notable for the following:    WBC 11.9 (*)    Neutro Abs 8.7 (*)    All other components within normal limits  COMPREHENSIVE METABOLIC PANEL - Abnormal; Notable for the following:    Glucose, Bld 110 (*)    BUN 23 (*)    Total Protein 9.3 (*)    Albumin 5.1  (*)    All other components within normal limits  URINALYSIS, ROUTINE W REFLEX MICROSCOPIC (NOT AT [facility]) - Abnormal; Notable for the following:    APPearance CLOUDY (*)    Hgb urine dipstick MODERATE (*)    Ketones, ur 15 (*)    Protein, ur 30 (*)    Leukocytes, UA SMALL (*)    All other components within normal limits  URINE MICROSCOPIC-ADD ON - Abnormal; Notable for the following:    Squamous Epithelial / LPF 0-5 (*)    Bacteria, UA RARE (*)    Casts HYALINE CASTS (*)    All other components within normal limits  LIPASE, BLOOD    Imaging Review No results found. I have personally reviewed and evaluated these images and lab results as part of my medical decision-making.   EKG Interpretation None      MDM   Final diagnoses:  Nausea vomiting and diarrhea  Gastroenteritis  Dehydration    49 y.o. male here with n/v/d and abd pain that began yesterday. On exam, no abd tenderness. Pt states pain is only with vomiting. Nonperitoneal abd exam, +BS throughout, doubt obstruction. Likely gastroenteritis. Will get basic labs, give fluids, and give zofran/morphine/protonix, then reassess shortly. Doubt need for imaging.   5:14 AM U/A contaminated  but without evidence of infection. CBC w/diff showing mildly elevated WBC which could be stress response since differential WNL. CMP WNL. Lipase WNL. Pt feeling "a whole lot better!" tolerating PO well here. Likely gastroenteritis. Will d/c home with zantac and zofran. Discussed use of tylenol for pain. F/up with [place] in 1wk. Discussed BRAT diet. I explained the diagnosis and have given explicit precautions to return to the ER including for any other new or worsening symptoms. The patient understands and accepts the medical plan as it's been dictated and I have answered their questions. Discharge instructions concerning home care and prescriptions have been given. The patient is STABLE and is discharged to home in good condition.  BP 104/80 mmHg   Pulse 61  Temp(Src) 98.4 F (36.9 C) (Oral)  Resp 20  Ht 6\' 1"  (1.854 m)  Wt 63.504 kg  BMI 18.47 kg/m2  SpO2 100%  Meds ordered this encounter  Medications  . morphine 4 MG/ML injection 4 mg    Sig:   . ondansetron (ZOFRAN) injection 4 mg    Sig:   . sodium chloride 0.9 % bolus 1,000 mL    Sig:   . pantoprazole (PROTONIX) injection 40 mg    Sig:   . ondansetron (ZOFRAN) 8 MG tablet    Sig: Take 1 tablet (8 mg total) by mouth every 8 (eight) hours as needed for nausea or vomiting.    Dispense:  10 tablet    Refill:  0    Order Specific Question:  Supervising Provider    Answer:  Sabra Heck, BRIAN [[id number]]  . ranitidine (ZANTAC) 150 MG tablet    Sig: Take 1 tablet (150 mg total) by mouth 2 (two) times daily. Take until symptoms resolve, then as needed thereafter    Dispense:  30 tablet    Refill:  0    Order Specific Question:  Supervising Provider    Answer:  Noemi Chapel [[id number]]     Aidaly Cordner Camprubi-Soms, PA-C 08/31/15 0515  Everlene Balls, MD 08/31/15 [phone]

## 2015-08-31 NOTE — ED Notes (Signed)
Patient presents nausea, chills and diarrhea. Reports 4-5 episodes of emesis and 1 episode of soft formed stool, c/o abdominal soreness from emesis. Denies fever.

## 2015-08-31 NOTE — ED Notes (Signed)
Patient given water to drink to see if he can tolerate it.

## 2015-08-31 NOTE — Discharge Instructions (Signed)
Use zofran as prescribed, as needed for nausea. Stay well hydrated with small sips of fluids throughout the day. Use tylenol as needed for pain. Avoid NSAIDs like ibuprofen or aleve as these may irritate your stomach more. Take zantac as directed. Follow a BRAT (banana-rice-applesauce-toast) diet as described below for the next 24-48 hours. The 'BRAT' diet is suggested, then progress to diet as tolerated as symptoms abate. Call your doctor if bloody stools, persistent diarrhea, vomiting, fever or abdominal pain. Follow up with [place] [facility] in 1 week for recheck of symptoms. Return to ER for changing or worsening of symptoms.  Food Choices to Help Relieve Diarrhea When you have diarrhea, the foods you eat and your eating habits are very important. Choosing the right foods and drinks can help relieve diarrhea. Also, because diarrhea can last up to 7 days, you need to replace lost fluids and electrolytes (such as sodium, potassium, and chloride) in order to help prevent dehydration.  WHAT GENERAL GUIDELINES DO I NEED TO FOLLOW?  Slowly drink 1 cup (8 oz) of fluid for each episode of diarrhea. If you are getting enough fluid, your urine will be clear or pale yellow.  Eat starchy foods. Some good choices include white rice, white toast, pasta, low-fiber cereal, baked potatoes (without the skin), saltine crackers, and bagels.  Avoid large servings of any cooked vegetables.  Limit fruit to two servings per day. A serving is  cup or 1 small piece.  Choose foods with less than 2 g of fiber per serving.  Limit fats to less than 8 tsp (38 g) per day.  Avoid fried foods.  Eat foods that have probiotics in them. Probiotics can be found in certain dairy products.  Avoid foods and beverages that may increase the speed at which food moves through the stomach and intestines (gastrointestinal tract). Things to avoid include:  High-fiber foods, such as dried fruit, raw fruits and vegetables, nuts,  seeds, and whole grain foods.  Spicy foods and high-fat foods.  Foods and beverages sweetened with high-fructose corn syrup, honey, or sugar alcohols such as xylitol, sorbitol, and mannitol. WHAT FOODS ARE RECOMMENDED? Grains White rice. White, [place], or pita breads (fresh or toasted), including plain rolls, buns, or bagels. White pasta. Saltine, soda, or graham crackers. Pretzels. Low-fiber cereal. Cooked cereals made with water (such as cornmeal, farina, or cream cereals). Plain muffins. Matzo. Melba toast. Zwieback.  Vegetables Potatoes (without the skin). Strained tomato and vegetable juices. Most well-cooked and canned vegetables without seeds. Tender lettuce. Fruits Cooked or canned applesauce, apricots, cherries, fruit cocktail, grapefruit, peaches, pears, or plums. Fresh bananas, apples without skin, cherries, grapes, cantaloupe, grapefruit, peaches, oranges, or plums.  Meat and Other Protein Products Baked or boiled chicken. Eggs. Tofu. Fish. Seafood. Smooth peanut butter. Ground or well-cooked tender beef, ham, veal, lamb, pork, or poultry.  Dairy Plain yogurt, kefir, and unsweetened liquid yogurt. Lactose-free milk, buttermilk, or soy milk. Plain hard cheese. Beverages Sport drinks. Clear broths. Diluted fruit juices (except prune). Regular, caffeine-free sodas such as ginger ale. Water. Decaffeinated teas. Oral rehydration solutions. Sugar-free beverages not sweetened with sugar alcohols. Other Bouillon, broth, or soups made from recommended foods.  The items listed above may not be a complete list of recommended foods or beverages. Contact your dietitian for more options. WHAT FOODS ARE NOT RECOMMENDED? Grains Whole grain, whole wheat, bran, or rye breads, rolls, pastas, crackers, and cereals. Wild or brown rice. Cereals that contain more than 2 g of fiber per  serving. Corn tortillas or taco shells. Cooked or dry oatmeal. Granola. Popcorn. Vegetables Raw vegetables. Cabbage,  broccoli, Brussels sprouts, artichokes, baked beans, beet greens, corn, kale, legumes, peas, sweet potatoes, and yams. Potato skins. Cooked spinach and cabbage. Fruits Dried fruit, including raisins and dates. Raw fruits. Stewed or dried prunes. Fresh apples with skin, apricots, mangoes, pears, raspberries, and strawberries.  Meat and Other Protein Products Chunky peanut butter. Nuts and seeds. Beans and lentils. Berniece Salines.  Dairy High-fat cheeses. Milk, chocolate milk, and beverages made with milk, such as milk shakes. Cream. Ice cream. Sweets and Desserts Sweet rolls, doughnuts, and sweet breads. Pancakes and waffles. Fats and Oils Butter. Cream sauces. Margarine. Salad oils. Plain salad dressings. Olives. Avocados.  Beverages Caffeinated beverages (such as coffee, tea, soda, or energy drinks). Alcoholic beverages. Fruit juices with pulp. Prune juice. Soft drinks sweetened with high-fructose corn syrup or sugar alcohols. Other Coconut. Hot sauce. Chili powder. Mayonnaise. Gravy. Cream-based or milk-based soups.  The items listed above may not be a complete list of foods and beverages to avoid. Contact your dietitian for more information. WHAT SHOULD I DO IF I BECOME DEHYDRATED? Diarrhea can sometimes lead to dehydration. Signs of dehydration include dark urine and dry mouth and skin. If you think you are dehydrated, you should rehydrate with an oral rehydration solution. These solutions can be purchased at pharmacies, retail stores, or online.  Drink -1 cup (120-240 mL) of oral rehydration solution each time you have an episode of diarrhea. If drinking this amount makes your diarrhea worse, try drinking smaller amounts more often. For example, drink 1-3 tsp (5-15 mL) every 5-10 minutes.  A general rule for staying hydrated is to drink 1-2 L of fluid per day. Talk to your health care provider about the specific amount you should be drinking each day. Drink enough fluids to keep your urine clear or  pale yellow. Document Released: 11/13/2003 Document Revised: 08/28/2013 Document Reviewed: 07/16/2013 [facility] Patient Information 2015 [place], [place]. This information is not intended to replace advice given to you by your health care provider. Make sure you discuss any questions you have with your health care provider.   Nausea and Vomiting Nausea means you feel sick to your stomach. Throwing up (vomiting) is a reflex where stomach contents come out of your mouth. HOME CARE   Take medicine as told by your doctor.  Do not force yourself to eat. However, you do need to drink fluids.  If you feel like eating, eat a normal diet as told by your doctor.  Eat rice, wheat, potatoes, bread, lean meats, yogurt, fruits, and vegetables.  Avoid high-fat foods.  Drink enough fluids to keep your pee (urine) clear or pale yellow.  Ask your doctor how to replace body fluid losses (rehydrate). Signs of body fluid loss (dehydration) include:  Feeling very thirsty.  Dry lips and mouth.  Feeling dizzy.  Dark pee.  Peeing less than normal.  Feeling confused.  Fast breathing or heart rate. GET HELP RIGHT AWAY IF:   You have blood in your throw up.  You have black or bloody poop (stool).  You have a bad headache or stiff neck.  You feel confused.  You have bad belly (abdominal) pain.  You have chest pain or trouble breathing.  You do not pee at least once every 8 hours.  You have cold, clammy skin.  You keep throwing up after 24 to 48 hours.  You have a fever. MAKE SURE YOU:   Understand these  instructions.  Will watch your condition.  Will get help right away if you are not doing well or get worse.   This information is not intended to replace advice given to you by your health care provider. Make sure you discuss any questions you have with your health care provider.   Document Released: 02/09/2008 Document Revised: 11/15/2011 Document Reviewed: 01/22/2011 Elsevier  Interactive Patient Education 2016 [place].  Diarrhea Diarrhea is watery poop (stool). It can make you feel weak, tired, thirsty, or give you a dry mouth (signs of dehydration). Watery poop is a sign of another problem, most often an infection. It often lasts 2-3 days. It can last longer if it is a sign of something serious. Take care of yourself as told by your doctor. HOME CARE   Drink 1 cup (8 ounces) of fluid each time you have watery poop.  Do not drink the following fluids:  Those that contain simple sugars (fructose, glucose, galactose, lactose, sucrose, maltose).  Sports drinks.  Fruit juices.  Whole milk products.  Sodas.  Drinks with caffeine (coffee, tea, soda) or alcohol.  Oral rehydration solution may be used if the doctor says it is okay. You may make your own solution. Follow this recipe:   - teaspoon table salt.   teaspoon baking soda.   teaspoon salt substitute containing potassium chloride.  1 tablespoons sugar.  1 liter (34 ounces) of water.  Avoid the following foods:  High fiber foods, such as raw fruits and vegetables.  Nuts, seeds, and whole grain breads and cereals.   Those that are sweetened with sugar alcohols (xylitol, sorbitol, mannitol).  Try eating the following foods:  Starchy foods, such as rice, toast, pasta, low-sugar cereal, oatmeal, baked potatoes, crackers, and bagels.  Bananas.  Applesauce.  Eat probiotic-rich foods, such as yogurt and milk products that are fermented.  Wash your hands well after each time you have watery poop.  Only take medicine as told by your doctor.  Take a warm bath to help lessen burning or pain from having watery poop. GET HELP RIGHT AWAY IF:   You cannot drink fluids without throwing up (vomiting).  You keep throwing up.  You have blood in your poop, or your poop looks black and tarry.  You do not pee (urinate) in 6-8 hours, or there is only a small amount of very dark pee.  You  have belly (abdominal) pain that gets worse or stays in the same spot (localizes).  You are weak, dizzy, confused, or light-headed.  You have a very bad headache.  Your watery poop gets worse or does not get better.  You have a fever or lasting symptoms for more than 2-3 days.  You have a fever and your symptoms suddenly get worse. MAKE SURE YOU:   Understand these instructions.  Will watch your condition.  Will get help right away if you are not doing well or get worse.   This information is not intended to replace advice given to you by your health care provider. Make sure you discuss any questions you have with your health care provider.   Document Released: 02/09/2008 Document Revised: 09/13/2014 Document Reviewed: 04/30/2012 Elsevier Interactive Patient Education 2016 Elsevier Inc.  Dehydration, Adult Dehydration means your body does not have as much fluid or water as it needs. It happens when you take in less fluid than you lose. Your kidneys, brain, and heart will not work properly without the right amount of fluids.  Dehydration can range  from mild to severe. It should be treated right away to help prevent it from becoming severe. HOME CARE  Drink enough fluid to keep your pee (urine) clear or pale yellow.  Drink water or fluid slowly by taking small sips. You can also try sucking on ice cubes.  Have food or drinks that contain electrolytes. Examples include bananas and sports drinks.  Take over-the-counter and prescription medicines only as told by your doctor.  Prepare oral rehydration solution (ORS) according to the instructions that came with it. Take sips of ORS every 5 minutes until your pee returns to normal.  If you are throwing up (vomiting) or have watery poop (diarrhea), keep trying to drink water, ORS, or both.  If you have watery poop, avoid:  Drinks with caffeine.  Fruit juice.  Milk.  Carbonated soft drinks.  Do not take salt tablets. This  can lead to having too much sodium in your body (hypernatremia). GET HELP IF:  You cannot eat or drink without throwing up.  You have had mild watery poop for longer than 24 hours.  You have a fever. GET HELP RIGHT AWAY IF:   You have very strong thirst.  You have very bad watery poop.  You have not peed in 6-8 hours, or you have peed only a small amount of very dark pee.  You have shriveled skin.  You are dizzy, confused, or both.   This information is not intended to replace advice given to you by your health care provider. Make sure you discuss any questions you have with your health care provider.   Document Released: 06/19/2009 Document Revised: 05/14/2015 Document Reviewed: 01/08/2015 Elsevier Interactive Patient Education [employer].

## 2016-04-16 ENCOUNTER — Emergency Department (HOSPITAL_COMMUNITY)
Admission: EM | Admit: 2016-04-16 | Discharge: 2016-04-16 | Disposition: A | Discharge status: Home / Self Care | Payer: Self-pay | Attending: Dermatology | Admitting: Dermatology

## 2016-04-16 ENCOUNTER — Encounter (HOSPITAL_COMMUNITY): Payer: Self-pay | Admitting: Emergency Medicine

## 2016-04-16 DIAGNOSIS — R197 Diarrhea, unspecified: Secondary | ICD-10-CM | POA: Insufficient documentation

## 2016-04-16 DIAGNOSIS — Z79899 Other long term (current) drug therapy: Secondary | ICD-10-CM | POA: Insufficient documentation

## 2016-04-16 DIAGNOSIS — R1110 Vomiting, unspecified: Secondary | ICD-10-CM | POA: Insufficient documentation

## 2016-04-16 DIAGNOSIS — Z5321 Procedure and treatment not carried out due to patient leaving prior to being seen by health care provider: Secondary | ICD-10-CM | POA: Insufficient documentation

## 2016-04-16 HISTORY — DX: Gastroparesis: K31.84

## 2016-04-16 LAB — COMPREHENSIVE METABOLIC PANEL
ALK PHOS: 72 U/L (ref 38–126)
ALT: 12 U/L — ABNORMAL LOW (ref 17–63)
ANION GAP: 11 (ref 5–15)
AST: 28 U/L (ref 15–41)
Albumin: 4.9 g/dL (ref 3.5–5.0)
BILIRUBIN TOTAL: 0.8 mg/dL (ref 0.3–1.2)
BUN: 16 mg/dL (ref 6–20)
CALCIUM: 9.2 mg/dL (ref 8.9–10.3)
CO2: 20 mmol/L — ABNORMAL LOW (ref 22–32)
Chloride: 109 mmol/L (ref 101–111)
Creatinine, Ser: 0.94 mg/dL (ref 0.61–1.24)
GFR calc non Af Amer: >60 mL/min (ref >60)
Glucose, Bld: 135 mg/dL — ABNORMAL HIGH (ref 65–99)
Potassium: 3.3 mmol/L — ABNORMAL LOW (ref 3.5–5.1)
Sodium: 140 mmol/L (ref 135–145)
TOTAL PROTEIN: 8.6 g/dL — AB (ref 6.5–8.1)

## 2016-04-16 LAB — CBC
HCT: 36.7 % — ABNORMAL LOW (ref 39.0–52.0)
HEMOGLOBIN: 13 g/dL (ref 13.0–17.0)
MCH: 31.6 pg (ref 26.0–34.0)
MCHC: 35.4 g/dL (ref 30.0–36.0)
MCV: 89.3 fL (ref 78.0–100.0)
Platelets: 240 10*3/uL (ref 150–400)
RBC: 4.11 MIL/uL — AB (ref 4.22–5.81)
RDW: 12.9 % (ref 11.5–15.5)
WBC: 10.3 10*3/uL (ref 4.0–10.5)

## 2016-04-16 LAB — URINALYSIS, ROUTINE W REFLEX MICROSCOPIC
BILIRUBIN URINE: NEGATIVE
Glucose, UA: NEGATIVE mg/dL
Ketones, ur: 15 mg/dL — AB
LEUKOCYTES UA: NEGATIVE
NITRITE: NEGATIVE
Protein, ur: NEGATIVE mg/dL
SPECIFIC GRAVITY, URINE: 1.026 (ref 1.005–1.030)
pH: 6 (ref 5.0–8.0)

## 2016-04-16 LAB — LIPASE, BLOOD: Lipase: 17 U/L (ref 11–51)

## 2016-04-16 LAB — URINE MICROSCOPIC-ADD ON

## 2016-04-16 MED ORDER — ONDANSETRON HCL 4 MG/2ML IJ SOLN
4.0000 mg | Freq: Once | INTRAMUSCULAR | Status: AC | PRN
  Administered 2016-04-16: 4 mg via INTRAVENOUS
  Filled 2016-04-16: qty 2

## 2016-04-16 NOTE — ED Notes (Signed)
Pt verbalizes he is cold. Blankets given no fever noted at this time.

## 2016-04-16 NOTE — ED Notes (Signed)
Pt requesting pain medication.  

## 2016-04-16 NOTE — ED Triage Notes (Signed)
Pt c/o vomiting and diarrhea. Has hx of gastroparesis. Pt actively vomiting in triage.

## 2016-04-18 ENCOUNTER — Emergency Department (HOSPITAL_COMMUNITY)
Admission: EM | Admit: 2016-04-18 | Discharge: 2016-04-19 | Disposition: A | Discharge status: Home / Self Care | Payer: Self-pay | Attending: Emergency Medicine | Admitting: Emergency Medicine

## 2016-04-18 ENCOUNTER — Encounter (HOSPITAL_COMMUNITY): Payer: Self-pay | Admitting: Emergency Medicine

## 2016-04-18 DIAGNOSIS — Z79899 Other long term (current) drug therapy: Secondary | ICD-10-CM | POA: Insufficient documentation

## 2016-04-18 DIAGNOSIS — I10 Essential (primary) hypertension: Secondary | ICD-10-CM | POA: Insufficient documentation

## 2016-04-18 DIAGNOSIS — R1115 Cyclical vomiting syndrome unrelated to migraine: Secondary | ICD-10-CM

## 2016-04-18 DIAGNOSIS — E860 Dehydration: Secondary | ICD-10-CM

## 2016-04-18 DIAGNOSIS — N179 Acute kidney failure, unspecified: Secondary | ICD-10-CM

## 2016-04-18 LAB — COMPREHENSIVE METABOLIC PANEL
ALBUMIN: 5.6 g/dL — AB (ref 3.5–5.0)
ALK PHOS: 73 U/L (ref 38–126)
ALT: 16 U/L — ABNORMAL LOW (ref 17–63)
ANION GAP: 12 (ref 5–15)
AST: 22 U/L (ref 15–41)
BUN: 38 mg/dL — ABNORMAL HIGH (ref 6–20)
CALCIUM: 9.6 mg/dL (ref 8.9–10.3)
CO2: 24 mmol/L (ref 22–32)
Chloride: 106 mmol/L (ref 101–111)
Creatinine, Ser: 1.6 mg/dL — ABNORMAL HIGH (ref 0.61–1.24)
GFR calc non Af Amer: 49 mL/min — ABNORMAL LOW (ref >60)
GFR, EST AFRICAN AMERICAN: 57 mL/min — AB (ref >60)
Glucose, Bld: 122 mg/dL — ABNORMAL HIGH (ref 65–99)
POTASSIUM: 3.5 mmol/L (ref 3.5–5.1)
SODIUM: 142 mmol/L (ref 135–145)
TOTAL PROTEIN: 9.6 g/dL — AB (ref 6.5–8.1)
Total Bilirubin: 0.9 mg/dL (ref 0.3–1.2)

## 2016-04-18 LAB — CBC
HEMATOCRIT: 43.4 % (ref 39.0–52.0)
HEMOGLOBIN: 15.1 g/dL (ref 13.0–17.0)
MCH: 31.1 pg (ref 26.0–34.0)
MCHC: 34.8 g/dL (ref 30.0–36.0)
MCV: 89.5 fL (ref 78.0–100.0)
Platelets: 262 10*3/uL (ref 150–400)
RBC: 4.85 MIL/uL (ref 4.22–5.81)
RDW: 13 % (ref 11.5–15.5)
WBC: 8.8 10*3/uL (ref 4.0–10.5)

## 2016-04-18 LAB — LIPASE, BLOOD: LIPASE: 16 U/L (ref 11–51)

## 2016-04-18 MED ORDER — SODIUM CHLORIDE 0.9 % IV BOLUS (SEPSIS)
1000.0000 mL | Freq: Once | INTRAVENOUS | Status: AC
  Administered 2016-04-18: 1000 mL via INTRAVENOUS

## 2016-04-18 MED ORDER — METOCLOPRAMIDE HCL 5 MG/ML IJ SOLN
10.0000 mg | Freq: Once | INTRAMUSCULAR | Status: AC
  Administered 2016-04-18: 10 mg via INTRAVENOUS
  Filled 2016-04-18: qty 2

## 2016-04-18 MED ORDER — LORAZEPAM 2 MG/ML IJ SOLN
1.0000 mg | Freq: Once | INTRAMUSCULAR | Status: AC
  Administered 2016-04-18: 1 mg via INTRAVENOUS
  Filled 2016-04-18: qty 1

## 2016-04-18 MED ORDER — DIPHENHYDRAMINE HCL 50 MG/ML IJ SOLN
25.0000 mg | Freq: Once | INTRAMUSCULAR | Status: AC
  Administered 2016-04-18: 25 mg via INTRAVENOUS
  Filled 2016-04-18: qty 1

## 2016-04-18 NOTE — ED Notes (Signed)
Per main lab, blood collected at 1831

## 2016-04-18 NOTE — ED Triage Notes (Signed)
Patient presents for emesis x4 episodes in last 24 hours. Reports being seen last Friday for same, felt better at discharge but N/V, abdominal pain with vomiting returned. Reports decreased PO intake. Denies fever, diarrhea.

## 2016-04-18 NOTE — ED Notes (Signed)
Pt stated that he had me the other night and knew that I wasn't going to give him pain meds that he wanted to see his nurse. PT stated he wants 2 bags of fluid and meds before he will give [place] a urine sample

## 2016-04-18 NOTE — ED Notes (Signed)
Patient aware that urine sample is needed.

## 2016-04-19 LAB — URINALYSIS, ROUTINE W REFLEX MICROSCOPIC
GLUCOSE, UA: NEGATIVE mg/dL
Ketones, ur: NEGATIVE mg/dL
Nitrite: NEGATIVE
PH: 5 (ref 5.0–8.0)
PROTEIN: 100 mg/dL — AB
Specific Gravity, Urine: 1.03 (ref 1.005–1.030)

## 2016-04-19 LAB — URINE MICROSCOPIC-ADD ON: Squamous Epithelial / LPF: NONE SEEN

## 2016-04-19 MED ORDER — METOCLOPRAMIDE HCL 10 MG PO TABS: 10.0000 mg | ORAL_TABLET | Freq: Four times a day (QID) | ORAL | 0 refills | Status: DC | PRN

## 2016-04-19 MED ORDER — DIPHENHYDRAMINE HCL 25 MG PO CAPS: 25.0000 mg | ORAL_CAPSULE | Freq: Four times a day (QID) | ORAL | 0 refills | Status: DC | PRN

## 2016-04-19 NOTE — ED Provider Notes (Addendum)
[facility] DEPT Provider Note   CSN: [id number] Arrival date & time: 04/18/16  [id number]  First Provider Contact:  First MD Initiated Contact with Patient 04/19/16 2230        History   Chief Complaint Chief Complaint  Patient presents with  . Emesis    HPI Lee Greer is a 50 y.o. male.  HPI 50 year old male with reported past medical history of gastroparesis and recent diagnosis of gastritis who presents with ongoing nausea and vomiting. Patient was just seen in the ED 2 days ago for similar symptoms. He was given IV fluids and antiemetics with resolution of the symptoms. He states he returned home and began to feel progressively more nauseous over the last 24 hours she has had progressively worsening persistent nausea and vomiting. He has been unable to tolerate any by mouth intake for the last 24 hours. Denies any fevers or chills. Denies any significant abdominal pain. Of note, the patient has been smoking marijuana regularly for his nausea during these episodes. No fevers, no dysuria or hematuria. No flank pain. No history of abdominal surgeries. His abdominal pain is mild, generalized and cramping.  Past Medical History:  Diagnosis Date  . Gastroparesis     Patient Active Problem List   Diagnosis Date Noted  . Essential hypertension 02/17/2015  . Nausea with vomiting 02/17/2015    History reviewed. No pertinent surgical history.     Home Medications    Prior to Admission medications   Medication Sig Start Date End Date Taking? Authorizing Provider  ondansetron (ZOFRAN) 8 MG tablet Take 1 tablet (8 mg total) by mouth every 8 (eight) hours as needed for nausea or vomiting. 08/31/15  Yes Mercedes Camprubi-Soms, PA-C  diphenhydrAMINE (BENADRYL) 25 mg capsule Take 1 capsule (25 mg total) by mouth every 6 (six) hours as needed (Take with reglan for nausea). 04/19/16   Duffy Bruce, MD  metoCLOPramide (REGLAN) 10 MG tablet Take 1 tablet (10 mg total) by mouth every 6  (six) hours as needed for nausea or vomiting. 04/19/16   Duffy Bruce, MD    Family History No family history on file.  Social History Social History  Substance Use Topics  . Smoking status: Never Smoker  . Smokeless tobacco: Never Used  . Alcohol use No     Allergies   Review of patient's allergies indicates no known allergies.   Review of Systems Review of Systems  Constitutional: Negative for chills, fatigue and fever.  HENT: Negative for congestion and rhinorrhea.   Eyes: Negative for visual disturbance.  Respiratory: Negative for cough, shortness of breath and wheezing.   Cardiovascular: Negative for chest pain and leg swelling.  Gastrointestinal: Positive for abdominal pain, nausea and vomiting. Negative for diarrhea.  Genitourinary: Negative for dysuria and flank pain.  Musculoskeletal: Negative for neck pain and neck stiffness.  Skin: Negative for rash and wound.  Allergic/Immunologic: Negative for immunocompromised state.  Neurological: Negative for syncope, weakness and headaches.     Physical Exam Updated Vital Signs BP 156/99 (BP Location: Right Arm)   Pulse 80   Temp 98.8 F (37.1 C) (Oral)   Resp 16   SpO2 97%   Physical Exam  Constitutional: He is oriented to person, place, and time. He appears well-developed and well-nourished. No distress.  HENT:  Head: Normocephalic and atraumatic.  Dry mucous membranes  Eyes: Conjunctivae are normal. Pupils are equal, round, and reactive to light.  Neck: Neck supple.  Cardiovascular: Normal rate, regular rhythm and normal heart  sounds.  Exam reveals no friction rub.   No murmur heard. Pulmonary/Chest: Effort normal and breath sounds normal. No respiratory distress. He has no wheezes. He has no rales.  Abdominal: Soft. He exhibits no distension. There is tenderness (mild, epigastric).  Musculoskeletal: He exhibits no edema.  Neurological: He is alert and oriented to person, place, and time. He exhibits normal  muscle tone.  Skin: Skin is warm. Capillary refill takes less than 2 seconds.  Nursing note and vitals reviewed.    ED Treatments / Results  Labs (all labs ordered are listed, but only abnormal results are displayed) Labs Reviewed  COMPREHENSIVE METABOLIC PANEL - Abnormal; Notable for the following:       Result Value   Glucose, Bld 122 (*)    BUN 38 (*)    Creatinine, Ser 1.60 (*)    Total Protein 9.6 (*)    Albumin 5.6 (*)    ALT 16 (*)    GFR calc non Af Amer 49 (*)    GFR calc Af Amer 57 (*)    All other components within normal limits  URINALYSIS, ROUTINE W REFLEX MICROSCOPIC (NOT AT [facility]) - Abnormal; Notable for the following:    Color, Urine AMBER (*)    APPearance CLOUDY (*)    Hgb urine dipstick SMALL (*)    Bilirubin Urine SMALL (*)    Protein, ur 100 (*)    Leukocytes, UA SMALL (*)    All other components within normal limits  URINE MICROSCOPIC-ADD ON - Abnormal; Notable for the following:    Bacteria, UA FEW (*)    Casts HYALINE CASTS (*)    All other components within normal limits  URINE CULTURE  LIPASE, BLOOD  CBC    EKG  EKG Interpretation None       Radiology No results found.  Procedures Procedures (including critical care time)  Medications Ordered in ED Medications  sodium chloride 0.9 % bolus 1,000 mL (0 mLs Intravenous Stopped 04/19/16 0223)  sodium chloride 0.9 % bolus 1,000 mL (0 mLs Intravenous Stopped 04/19/16 0223)  metoCLOPramide (REGLAN) injection 10 mg (10 mg Intravenous Given 04/18/16 2243)  diphenhydrAMINE (BENADRYL) injection 25 mg (25 mg Intravenous Given 04/18/16 2245)  LORazepam (ATIVAN) injection 1 mg (1 mg Intravenous Given 04/18/16 2246)     Initial Impression / Assessment and Plan / ED Course  I have reviewed the triage vital signs and the nursing notes.  Pertinent labs & imaging results that were available during my care of the patient were reviewed by me and considered in my medical decision making (see chart for  details).  Clinical Course    50 year old male with past medical history of recurrent gastroparesis who presents with persistent nausea and vomiting. On arrival, vital signs are stable and within normal limits. He is hypertensive, which is his baseline lab work obtained and reviewed as above. Patient does have mild AK I, with elevated BUN and creatinine of 1.6. This is consistent with prerenal etiology, likely secondary to dehydration from vomiting. CBC shows no leukocytosis. Lipase is normal. Based on patient's history and exam, my primary suspicion is acute on chronic gastroparesis versus cannabis hyperemesis syndrome. Patient uses very large amounts of marijuana, which seems to correlate with the symptoms. will give IV fluids, antiemetics, and reassess  Patient is now tolerating by mouth without difficulty after IV fluids and antiemetics. His abdomen remained soft, nontender, nondistended. I have a low suspicion for appendicitis, pancreatitis, cholecystitis or other acute intra-abdominal pathology. Given  reassuring labs. No history of chronic abdominal pain and normal white count. Will give him a trial of Reglan at home and advised decreased marijuana use. Outpatient PCP follow-up encouraged  Final Clinical Impressions(s) / ED Diagnoses   Final diagnoses:  Non-intractable cyclical vomiting with nausea  Dehydration  AKI (acute kidney injury) ([place])    New Prescriptions New Prescriptions   DIPHENHYDRAMINE (BENADRYL) 25 MG CAPSULE    Take 1 capsule (25 mg total) by mouth every 6 (six) hours as needed (Take with reglan for nausea).   METOCLOPRAMIDE (REGLAN) 10 MG TABLET    Take 1 tablet (10 mg total) by mouth every 6 (six) hours as needed for nausea or vomiting.     Duffy Bruce, MD 04/19/16 [id number]    Duffy Bruce, MD 04/19/16 [phone]

## 2016-04-19 NOTE — ED Notes (Signed)
Patient given ginger ale per po challenge.

## 2016-04-20 LAB — URINE CULTURE: Culture: NO GROWTH

## 2016-04-26 ENCOUNTER — Encounter (HOSPITAL_COMMUNITY): Payer: Self-pay | Admitting: Emergency Medicine

## 2016-04-26 ENCOUNTER — Emergency Department (HOSPITAL_COMMUNITY)
Admission: EM | Admit: 2016-04-26 | Discharge: 2016-04-26 | Disposition: A | Discharge status: Home / Self Care | Payer: Self-pay | Attending: Emergency Medicine | Admitting: Emergency Medicine

## 2016-04-26 DIAGNOSIS — Z79899 Other long term (current) drug therapy: Secondary | ICD-10-CM | POA: Insufficient documentation

## 2016-04-26 DIAGNOSIS — R1115 Cyclical vomiting syndrome unrelated to migraine: Secondary | ICD-10-CM

## 2016-04-26 DIAGNOSIS — I10 Essential (primary) hypertension: Secondary | ICD-10-CM | POA: Insufficient documentation

## 2016-04-26 DIAGNOSIS — G43A0 Cyclical vomiting, not intractable: Secondary | ICD-10-CM | POA: Insufficient documentation

## 2016-04-26 DIAGNOSIS — R112 Nausea with vomiting, unspecified: Secondary | ICD-10-CM

## 2016-04-26 DIAGNOSIS — R197 Diarrhea, unspecified: Secondary | ICD-10-CM | POA: Insufficient documentation

## 2016-04-26 LAB — CBC
HEMATOCRIT: 42.9 % (ref 39.0–52.0)
Hemoglobin: 15.3 g/dL (ref 13.0–17.0)
MCH: 31.2 pg (ref 26.0–34.0)
MCHC: 35.7 g/dL (ref 30.0–36.0)
MCV: 87.4 fL (ref 78.0–100.0)
Platelets: 259 10*3/uL (ref 150–400)
RBC: 4.91 MIL/uL (ref 4.22–5.81)
RDW: 12 % (ref 11.5–15.5)
WBC: 6.9 10*3/uL (ref 4.0–10.5)

## 2016-04-26 LAB — COMPREHENSIVE METABOLIC PANEL
ALBUMIN: 5 g/dL (ref 3.5–5.0)
ALT: 17 U/L (ref 17–63)
AST: 19 U/L (ref 15–41)
Alkaline Phosphatase: 68 U/L (ref 38–126)
Anion gap: 15 (ref 5–15)
BUN: 42 mg/dL — AB (ref 6–20)
CHLORIDE: 99 mmol/L — AB (ref 101–111)
CO2: 22 mmol/L (ref 22–32)
Calcium: 9.1 mg/dL (ref 8.9–10.3)
Creatinine, Ser: 1.19 mg/dL (ref 0.61–1.24)
GFR calc Af Amer: >60 mL/min (ref >60)
GFR calc non Af Amer: >60 mL/min (ref >60)
GLUCOSE: 119 mg/dL — AB (ref 65–99)
POTASSIUM: 2.7 mmol/L — AB (ref 3.5–5.1)
SODIUM: 136 mmol/L (ref 135–145)
Total Bilirubin: 1.4 mg/dL — ABNORMAL HIGH (ref 0.3–1.2)
Total Protein: 8.6 g/dL — ABNORMAL HIGH (ref 6.5–8.1)

## 2016-04-26 LAB — URINALYSIS, ROUTINE W REFLEX MICROSCOPIC
GLUCOSE, UA: NEGATIVE mg/dL
KETONES UR: 40 mg/dL — AB
LEUKOCYTES UA: NEGATIVE
Nitrite: NEGATIVE
PH: 5 (ref 5.0–8.0)
PROTEIN: NEGATIVE mg/dL
Specific Gravity, Urine: 1.03 (ref 1.005–1.030)

## 2016-04-26 LAB — LIPASE, BLOOD: LIPASE: 15 U/L (ref 11–51)

## 2016-04-26 LAB — URINE MICROSCOPIC-ADD ON

## 2016-04-26 MED ORDER — FAMOTIDINE IN NACL 20-0.9 MG/50ML-% IV SOLN
20.0000 mg | Freq: Once | INTRAVENOUS | Status: AC
  Administered 2016-04-26: 20 mg via INTRAVENOUS
  Filled 2016-04-26: qty 50

## 2016-04-26 MED ORDER — POTASSIUM CHLORIDE ER 20 MEQ PO TBCR: 10.0000 meq | EXTENDED_RELEASE_TABLET | Freq: Two times a day (BID) | ORAL | 0 refills | Status: DC

## 2016-04-26 MED ORDER — MAGNESIUM SULFATE 2 GM/50ML IV SOLN
2.0000 g | Freq: Once | INTRAVENOUS | Status: AC
  Administered 2016-04-26: 2 g via INTRAVENOUS
  Filled 2016-04-26: qty 50

## 2016-04-26 MED ORDER — SODIUM CHLORIDE 0.9 % IV BOLUS (SEPSIS): 1000.0000 mL | Freq: Once | INTRAVENOUS | Status: DC

## 2016-04-26 MED ORDER — POTASSIUM CHLORIDE ER 20 MEQ PO TBCR: 20.0000 meq | EXTENDED_RELEASE_TABLET | Freq: Two times a day (BID) | ORAL | 0 refills | Status: DC

## 2016-04-26 MED ORDER — LORAZEPAM 2 MG/ML IJ SOLN
1.0000 mg | Freq: Once | INTRAMUSCULAR | Status: AC
  Administered 2016-04-26: 1 mg via INTRAVENOUS
  Filled 2016-04-26: qty 1

## 2016-04-26 MED ORDER — POTASSIUM CHLORIDE CRYS ER 20 MEQ PO TBCR
60.0000 meq | EXTENDED_RELEASE_TABLET | Freq: Once | ORAL | Status: AC
  Administered 2016-04-26: 60 meq via ORAL
  Filled 2016-04-26: qty 3

## 2016-04-26 MED ORDER — MAGNESIUM SULFATE 50 % IJ SOLN: 2.0000 g | Freq: Once | INTRAMUSCULAR | Status: DC

## 2016-04-26 MED ORDER — FAMOTIDINE 20 MG PO TABS: 20.0000 mg | ORAL_TABLET | Freq: Two times a day (BID) | ORAL | 0 refills | Status: DC

## 2016-04-26 MED ORDER — POTASSIUM CHLORIDE IN NACL 20-0.9 MEQ/L-% IV SOLN
Freq: Once | INTRAVENOUS | Status: AC
Start: 2016-04-26 — End: 2016-04-26
  Administered 2016-04-26: 13:00:00 via INTRAVENOUS
  Filled 2016-04-26: qty 1000

## 2016-04-26 MED ORDER — POTASSIUM CHLORIDE 10 MEQ/100ML IV SOLN
10.0000 meq | INTRAVENOUS | Status: DC
  Filled 2016-04-26 (×2): qty 100

## 2016-04-26 NOTE — ED Notes (Signed)
PT STS HE COULD NOT TOLERATE SWALLOWING THE POTASSIUM TABLETS WITH THE WATER OR THE GINGER ALE. PA ALEX MADE AWARE.

## 2016-04-26 NOTE — ED Notes (Signed)
Pt given urinal and informed a sample was needed.

## 2016-04-26 NOTE — ED Notes (Signed)
PT GIVEN ICE CHIPS.

## 2016-04-26 NOTE — ED Triage Notes (Signed)
Per pt states he has been seen twice for the same complaints-vomiting, diarrhea and abdominal pain-states he was diagnosed with a virus on the 11th-symptoms have not stopped-unable to keep fluids down

## 2016-04-26 NOTE — ED Provider Notes (Signed)
[facility] DEPT Provider Note   CSN: [id number] Arrival date & time: 04/26/16  [id number]     History   Chief Complaint Chief Complaint  Patient presents with  . Abdominal Pain  . Emesis    HPI Lee Greer is a 50 y.o. male with history of gastroparesis and gastritis who returns with ongoing nausea, vomiting and new diarrhea today. Patient was seen in the emergency department on 8/11 and 8/13. He was discharged with Benadryl and Reglan with some improvement of his symptoms. Patient still continues to have 2 episodes of vomiting per day. Patient also had one episode of nonbloody diarrhea for the first time today. Patient reports epigastric abdominal pain, especially with vomiting. Patient denies any new abdominal pain, fevers, chest pain, shortness of breath, urinary symptoms. Patient continues to smoke marijuana regularly. He acknowledges he was told last time that this could be a cause of his symptoms.  HPI  Past Medical History:  Diagnosis Date  . Gastroparesis     Patient Active Problem List   Diagnosis Date Noted  . Essential hypertension 02/17/2015  . Nausea with vomiting 02/17/2015    History reviewed. No pertinent surgical history.     Home Medications    Prior to Admission medications   Medication Sig Start Date End Date Taking? Authorizing Provider  diphenhydrAMINE (BENADRYL) 25 mg capsule Take 1 capsule (25 mg total) by mouth every 6 (six) hours as needed (Take with reglan for nausea). 04/19/16  Yes Duffy Bruce, MD  metoCLOPramide (REGLAN) 10 MG tablet Take 1 tablet (10 mg total) by mouth every 6 (six) hours as needed for nausea or vomiting. 04/19/16  Yes Duffy Bruce, MD  famotidine (PEPCID) 20 MG tablet Take 1 tablet (20 mg total) by mouth 2 (two) times daily. 04/26/16   Pritesh Sobecki M Kenleigh Toback, PA-C  ondansetron (ZOFRAN) 8 MG tablet Take 1 tablet (8 mg total) by mouth every 8 (eight) hours as needed for nausea or vomiting. Patient not taking: Reported on  04/26/2016 08/31/15   Mercedes Camprubi-Soms, PA-C  Potassium Chloride ER 20 MEQ TBCR Take 20 mEq by mouth 2 (two) times daily. 04/26/16   Frederica Kuster, PA-C    Family History No family history on file.  Social History Social History  Substance Use Topics  . Smoking status: Never Smoker  . Smokeless tobacco: Never Used  . Alcohol use No     Allergies   Review of patient's allergies indicates no known allergies.   Review of Systems Review of Systems  Constitutional: Negative for chills and fever.  HENT: Negative for facial swelling and sore throat.   Respiratory: Negative for shortness of breath.   Cardiovascular: Negative for chest pain.  Gastrointestinal: Positive for abdominal pain (epigastric), diarrhea, nausea and vomiting. Negative for blood in stool.  Genitourinary: Negative for dysuria.  Musculoskeletal: Negative for back pain.  Skin: Negative for rash and wound.  Neurological: Negative for headaches.  Psychiatric/Behavioral: The patient is not nervous/anxious.      Physical Exam Updated Vital Signs BP 138/79 (BP Location: Left Arm)   Pulse 75   Temp 98.6 F (37 C) (Oral)   Resp 20   SpO2 100%   Physical Exam  Constitutional: He appears well-developed and well-nourished. No distress.  HENT:  Head: Normocephalic and atraumatic.  Mouth/Throat: Oropharynx is clear and moist. No oropharyngeal exudate.  Eyes: Conjunctivae are normal. Pupils are equal, round, and reactive to light. Right eye exhibits no discharge. Left eye exhibits no discharge. No scleral icterus.  Neck: Normal range of motion. Neck supple. No thyromegaly present.  Cardiovascular: Normal rate, regular rhythm, normal heart sounds and intact distal pulses.  Exam reveals no gallop and no friction rub.   No murmur heard. Pulmonary/Chest: Effort normal and breath sounds normal. No stridor. No respiratory distress. He has no wheezes. He has no rales.  Abdominal: Soft. Bowel sounds are normal. He  exhibits no distension. There is no tenderness. There is no rebound and no guarding.  Musculoskeletal: He exhibits no edema.  Lymphadenopathy:    He has no cervical adenopathy.  Neurological: He is alert. Coordination normal.  Skin: Skin is warm and dry. No rash noted. He is not diaphoretic. No pallor.  Psychiatric: He has a normal mood and affect.  Nursing note and vitals reviewed.    ED Treatments / Results  Labs (all labs ordered are listed, but only abnormal results are displayed) Labs Reviewed  COMPREHENSIVE METABOLIC PANEL - Abnormal; Notable for the following:       Result Value   Potassium 2.7 (*)    Chloride 99 (*)    Glucose, Bld 119 (*)    BUN 42 (*)    Total Protein 8.6 (*)    Total Bilirubin 1.4 (*)    All other components within normal limits  URINALYSIS, ROUTINE W REFLEX MICROSCOPIC (NOT AT [facility]) - Abnormal; Notable for the following:    Hgb urine dipstick TRACE (*)    Bilirubin Urine SMALL (*)    Ketones, ur 40 (*)    All other components within normal limits  URINE MICROSCOPIC-ADD ON - Abnormal; Notable for the following:    Squamous Epithelial / LPF 0-5 (*)    Bacteria, UA RARE (*)    Casts HYALINE CASTS (*)    All other components within normal limits  LIPASE, BLOOD  CBC    EKG  EKG Interpretation  Date/Time:  Monday April 26 2016 11:39:28 EDT Ventricular Rate:  90 PR Interval:    QRS Duration: 77 QT Interval:  448 QTC Calculation: 549 R Axis:   77 Text Interpretation:  Sinus rhythm Right atrial enlargement RSR' in V1 or V2, probably normal variant Prolonged QT interval Baseline wander in lead(s) V3 No significant change since last tracing Confirmed by LITTLE MD, RACHEL [id number]) on 04/26/2016 12:06:57 PM       Radiology No results found.  Procedures Procedures (including critical care time)  Medications Ordered in ED Medications  LORazepam (ATIVAN) injection 1 mg (1 mg Intravenous Given 04/26/16 1205)  potassium chloride SA  (K-DUR,KLOR-CON) CR tablet 60 mEq (60 mEq Oral Given 04/26/16 1310)  magnesium sulfate IVPB 2 g 50 mL (0 g Intravenous Stopped 04/26/16 1307)  0.9 % NaCl with KCl 20 mEq/ L  infusion ( Intravenous Stopped 04/26/16 1454)  famotidine (PEPCID) IVPB 20 mg premix (0 mg Intravenous Stopped 04/26/16 1330)  LORazepam (ATIVAN) injection 1 mg (1 mg Intravenous Given 04/26/16 1722)     Initial Impression / Assessment and Plan / ED Course  I have reviewed the triage vital signs and the nursing notes.  Pertinent labs & imaging results that were available during my care of the patient were reviewed by me and considered in my medical decision making (see chart for details).  Clinical Course    3:27pm On reevaluation, patient states that he is feeling much better, no longer feeling nausea or epigastric pain. PO challenge with plans for discharge pending.  Abdominal exam benign, soft, nontender. Suspect marijuana use as significant contributor to patient's  symptoms. We will discharge home with Pepcid for gastritis symptoms.  CBC unremarkable. BMP shows potassium 2.7, chloride 99, glucose 119, BUN 42, protein 8.6, total bilirubin 1.4. Lipase 15. UA shows trace hematuria, small bilirubin, 40 ketones, negative leukocytes, rare bacteria, hyaline casts. Patient denies any urinary symptoms. Suspect dehydration is causing the patient's irregularities in UA. EKG shows NSR, Right atrial enlargement; RSR' in V1 or V2, probably normal variant; Prolonged QT interval; Baseline wander in lead(s) V3; No significant change since last tracing. IV Potassium and Magnesium replaced in ED. After Ativan and Pepcid in ED, patient tolerating PO fluids and feeling much better. Patient discharged with oral potassium for 5 days. Patient also discharged with Pepcid. Patient has Reglan and Benadryl at home. I stressed with patient the importance of quitting marijuana to prevent reoccurrence of his symptoms. Patient also advised to follow up and  establish care with a primary care provider. Patient understands and agrees with plan. Return precautions discussed. Patient vitals stable throughout ED course and discharged in satisfactory condition. I discussed patient with Dr. Rex Kras who guided the patient's management and agrees with plan.  Final Clinical Impressions(s) / ED Diagnoses   Final diagnoses:  Nausea vomiting and diarrhea  Non-intractable cyclical vomiting with nausea    New Prescriptions Discharge Medication List as of 04/26/2016  7:37 PM       Frederica Kuster, PA-C 04/27/16 [place], MD 04/30/16 [phone]

## 2016-04-26 NOTE — Discharge Instructions (Signed)
Medications: Pepcid, potassium  Treatment: Take Pepcid twice daily. Take potassium for 5 days as prescribed. Continue taking your Reglan and Benadryl as needed for your nausea and vomiting. Make sure to drink plenty of fluids, at least 8 ounces of water daily. Stopping your marijuana use will be the most definitive treatment for your symptoms, as it is most likely the cause.  Follow-up: Please follow-up and establish care with a primary care provider by calling the number circled on your discharge paperwork. Please return to emergency department if you develop any new or worsening symptoms.

## 2016-04-26 NOTE — ED Notes (Signed)
PT GIVEN WATER AND GINGER ALE.

## 2016-12-27 ENCOUNTER — Emergency Department (HOSPITAL_COMMUNITY)
Admission: EM | Admit: 2016-12-27 | Discharge: 2016-12-27 | Disposition: A | Discharge status: Home / Self Care | Payer: Self-pay | Attending: Emergency Medicine | Admitting: Emergency Medicine

## 2016-12-27 ENCOUNTER — Encounter (HOSPITAL_COMMUNITY): Payer: Self-pay

## 2016-12-27 DIAGNOSIS — R112 Nausea with vomiting, unspecified: Secondary | ICD-10-CM | POA: Insufficient documentation

## 2016-12-27 DIAGNOSIS — Z79899 Other long term (current) drug therapy: Secondary | ICD-10-CM | POA: Insufficient documentation

## 2016-12-27 DIAGNOSIS — R197 Diarrhea, unspecified: Secondary | ICD-10-CM

## 2016-12-27 DIAGNOSIS — R109 Unspecified abdominal pain: Secondary | ICD-10-CM | POA: Insufficient documentation

## 2016-12-27 LAB — CBC
HCT: 39.5 % (ref 39.0–52.0)
Hemoglobin: 13.7 g/dL (ref 13.0–17.0)
MCH: 31.2 pg (ref 26.0–34.0)
MCHC: 34.7 g/dL (ref 30.0–36.0)
MCV: 90 fL (ref 78.0–100.0)
Platelets: 250 10*3/uL (ref 150–400)
RBC: 4.39 MIL/uL (ref 4.22–5.81)
RDW: 13.4 % (ref 11.5–15.5)
WBC: 15.5 10*3/uL — ABNORMAL HIGH (ref 4.0–10.5)

## 2016-12-27 LAB — COMPREHENSIVE METABOLIC PANEL
ALT: 16 U/L — ABNORMAL LOW (ref 17–63)
AST: 27 U/L (ref 15–41)
Albumin: 5 g/dL (ref 3.5–5.0)
Alkaline Phosphatase: 77 U/L (ref 38–126)
Anion gap: 12 (ref 5–15)
BUN: 15 mg/dL (ref 6–20)
CO2: 19 mmol/L — ABNORMAL LOW (ref 22–32)
Calcium: 9.6 mg/dL (ref 8.9–10.3)
Chloride: 109 mmol/L (ref 101–111)
Creatinine, Ser: 0.83 mg/dL (ref 0.61–1.24)
GFR calc Af Amer: >60 mL/min (ref >60)
GFR calc non Af Amer: >60 mL/min (ref >60)
Glucose, Bld: 152 mg/dL — ABNORMAL HIGH (ref 65–99)
Potassium: 3.6 mmol/L (ref 3.5–5.1)
Sodium: 140 mmol/L (ref 135–145)
Total Bilirubin: 0.9 mg/dL (ref 0.3–1.2)
Total Protein: 9 g/dL — ABNORMAL HIGH (ref 6.5–8.1)

## 2016-12-27 LAB — LIPASE, BLOOD: Lipase: 14 U/L (ref 11–51)

## 2016-12-27 MED ORDER — PROMETHAZINE HCL 25 MG/ML IJ SOLN
12.5000 mg | Freq: Once | INTRAMUSCULAR | Status: AC
  Administered 2016-12-27: 12.5 mg via INTRAVENOUS
  Filled 2016-12-27: qty 1

## 2016-12-27 MED ORDER — METOCLOPRAMIDE HCL 10 MG PO TABS: 10.0000 mg | ORAL_TABLET | Freq: Three times a day (TID) | ORAL | 0 refills | Status: DC | PRN

## 2016-12-27 MED ORDER — ONDANSETRON 4 MG PO TBDP
4.0000 mg | ORAL_TABLET | Freq: Once | ORAL | Status: AC | PRN
  Administered 2016-12-27: 4 mg via ORAL
  Filled 2016-12-27: qty 1

## 2016-12-27 MED ORDER — SODIUM CHLORIDE 0.9 % IV BOLUS (SEPSIS)
1000.0000 mL | Freq: Once | INTRAVENOUS | Status: AC
  Administered 2016-12-27: 1000 mL via INTRAVENOUS

## 2016-12-27 MED ORDER — HYDROMORPHONE HCL 1 MG/ML IJ SOLN
0.7500 mg | Freq: Once | INTRAMUSCULAR | Status: AC
  Administered 2016-12-27: 0.75 mg via INTRAVENOUS
  Filled 2016-12-27: qty 1

## 2016-12-27 NOTE — ED Notes (Signed)
Updated Dr Wilson Singer regarding pt sleeping without emesis or diarrhea since arrival.

## 2016-12-27 NOTE — ED Notes (Signed)
Bed: WA06 Expected date:  Expected time:  Means of arrival:  Comments: 

## 2016-12-27 NOTE — ED Provider Notes (Signed)
[facility] DEPT Provider Note   CSN: [id number] Arrival date & time: 12/27/16  3532     History   Chief Complaint Chief Complaint  Patient presents with  . Abdominal Pain    HPI Lee Greer is a 51 y.o. male.  HPI   51 year old male with nausea, vomiting and diarrhea. Symptom onset yesterday. He reports a episodes of both. No blood in stool or emesis. Chills. Diffuse abdominal pain. The pain began after the onset of vomiting. No sick contacts. Patient denies any past medical history, but per review of records, he has been seen in the emergency room approximately 10 times in the couple years with nausea and vomiting. His past medical history list gastroparesis. He does not appear to have a history of diabetes. Denies any significant alcohol use. Has not tried taking anything for her symptoms.  Past Medical History:  Diagnosis Date  . Gastroparesis     Patient Active Problem List   Diagnosis Date Noted  . Essential hypertension 02/17/2015  . Nausea with vomiting 02/17/2015    History reviewed. No pertinent surgical history.     Home Medications    Prior to Admission medications   Medication Sig Start Date End Date Taking? Authorizing Provider  diphenhydrAMINE (BENADRYL) 25 mg capsule Take 1 capsule (25 mg total) by mouth every 6 (six) hours as needed (Take with reglan for nausea). 04/19/16   Duffy Bruce, MD  famotidine (PEPCID) 20 MG tablet Take 1 tablet (20 mg total) by mouth 2 (two) times daily. 04/26/16   Frederica Kuster, PA-C  metoCLOPramide (REGLAN) 10 MG tablet Take 1 tablet (10 mg total) by mouth every 6 (six) hours as needed for nausea or vomiting. 04/19/16   Duffy Bruce, MD  ondansetron (ZOFRAN) 8 MG tablet Take 1 tablet (8 mg total) by mouth every 8 (eight) hours as needed for nausea or vomiting. Patient not taking: Reported on 04/26/2016 08/31/15   Mercedes Street, PA-C  Potassium Chloride ER 20 MEQ TBCR Take 20 mEq by mouth 2 (two) times daily.  04/26/16   Frederica Kuster, PA-C    Family History No family history on file.  Social History Social History  Substance Use Topics  . Smoking status: Never Smoker  . Smokeless tobacco: Never Used  . Alcohol use No     Allergies   Patient has no known allergies.   Review of Systems Review of Systems  All systems reviewed and negative, other than as noted in HPI.   Physical Exam Updated Vital Signs BP (!) 157/102   Pulse 68   Temp 97.7 F (36.5 C) (Oral)   Resp 17   SpO2 100%   Physical Exam  Constitutional: He appears well-developed and well-nourished. No distress.  HENT:  Head: Normocephalic and atraumatic.  Eyes: Conjunctivae are normal. Right eye exhibits no discharge. Left eye exhibits no discharge.  Neck: Neck supple.  Cardiovascular: Normal rate, regular rhythm and normal heart sounds.  Exam reveals no gallop and no friction rub.   No murmur heard. Pulmonary/Chest: Effort normal and breath sounds normal. No respiratory distress.  Abdominal: Soft. He exhibits no distension. There is no tenderness.  Abdomen is flat, soft and nontender.  Musculoskeletal: He exhibits no edema or tenderness.  Neurological: He is alert.  Skin: Skin is warm and dry.  Psychiatric: He has a normal mood and affect. His behavior is normal. Thought content normal.  Nursing note and vitals reviewed.    ED Treatments / Results  Labs (all  labs ordered are listed, but only abnormal results are displayed) Labs Reviewed  COMPREHENSIVE METABOLIC PANEL - Abnormal; Notable for the following:       Result Value   CO2 19 (*)    Glucose, Bld 152 (*)    Total Protein 9.0 (*)    ALT 16 (*)    All other components within normal limits  CBC - Abnormal; Notable for the following:    WBC 15.5 (*)    All other components within normal limits  LIPASE, BLOOD    EKG  EKG Interpretation None       Radiology No results found.  Procedures Procedures (including critical care  time)  Medications Ordered in ED Medications  sodium chloride 0.9 % bolus 1,000 mL (not administered)  promethazine (PHENERGAN) injection 12.5 mg (not administered)  HYDROmorphone (DILAUDID) injection 0.75 mg (not administered)  ondansetron (ZOFRAN-ODT) disintegrating tablet 4 mg (4 mg Oral Given 12/27/16 0327)     Initial Impression / Assessment and Plan / ED Course  I have reviewed the triage vital signs and the nursing notes.  Pertinent labs & imaging results that were available during my care of the patient were reviewed by me and considered in my medical decision making (see chart for details).     51 year old male with nausea, vomiting and diarrhea. Symptom onset about 24H ago. His abdominal exam is very reassuring. He is afebrile. Labs fairly unremarkable aside from leukocytosis. We'll give him some fluids and she has pain and nausea. Reassess. At this time though, very low suspicion for emergent process. Will reassess after symptomatic treatment.  Symptoms improved. I doubt acute surgical process or other emergent condition.    Final Clinical Impressions(s) / ED Diagnoses   Final diagnoses:  Abdominal pain, unspecified abdominal location  Nausea vomiting and diarrhea    New Prescriptions New Prescriptions   No medications on file     Virgel Manifold, MD 12/30/16 1507

## 2017-04-12 ENCOUNTER — Encounter (HOSPITAL_COMMUNITY): Payer: Self-pay | Admitting: Emergency Medicine

## 2017-04-12 ENCOUNTER — Emergency Department (HOSPITAL_COMMUNITY)
Admission: EM | Admit: 2017-04-12 | Discharge: 2017-04-12 | Disposition: A | Discharge status: Home / Self Care | Payer: Self-pay | Attending: Emergency Medicine | Admitting: Emergency Medicine

## 2017-04-12 DIAGNOSIS — R1110 Vomiting, unspecified: Secondary | ICD-10-CM | POA: Insufficient documentation

## 2017-04-12 DIAGNOSIS — Z79899 Other long term (current) drug therapy: Secondary | ICD-10-CM | POA: Insufficient documentation

## 2017-04-12 DIAGNOSIS — R112 Nausea with vomiting, unspecified: Secondary | ICD-10-CM

## 2017-04-12 DIAGNOSIS — I10 Essential (primary) hypertension: Secondary | ICD-10-CM | POA: Insufficient documentation

## 2017-04-12 DIAGNOSIS — F1290 Cannabis use, unspecified, uncomplicated: Secondary | ICD-10-CM

## 2017-04-12 DIAGNOSIS — F12988 Cannabis use, unspecified with other cannabis-induced disorder: Secondary | ICD-10-CM | POA: Insufficient documentation

## 2017-04-12 LAB — COMPREHENSIVE METABOLIC PANEL
ALK PHOS: 72 U/L (ref 38–126)
ALT: 23 U/L (ref 17–63)
AST: 28 U/L (ref 15–41)
Albumin: 5.8 g/dL — ABNORMAL HIGH (ref 3.5–5.0)
Anion gap: 16 — ABNORMAL HIGH (ref 5–15)
BILIRUBIN TOTAL: 1.1 mg/dL (ref 0.3–1.2)
BUN: 32 mg/dL — ABNORMAL HIGH (ref 6–20)
CO2: 21 mmol/L — ABNORMAL LOW (ref 22–32)
Calcium: 10.1 mg/dL (ref 8.9–10.3)
Chloride: 101 mmol/L (ref 101–111)
Creatinine, Ser: 1.15 mg/dL (ref 0.61–1.24)
Glucose, Bld: 115 mg/dL — ABNORMAL HIGH (ref 65–99)
Potassium: 3.8 mmol/L (ref 3.5–5.1)
SODIUM: 138 mmol/L (ref 135–145)
TOTAL PROTEIN: 10.5 g/dL — AB (ref 6.5–8.1)

## 2017-04-12 LAB — CBC
HCT: 43 % (ref 39.0–52.0)
HEMOGLOBIN: 15.5 g/dL (ref 13.0–17.0)
MCH: 32 pg (ref 26.0–34.0)
MCHC: 36 g/dL (ref 30.0–36.0)
MCV: 88.7 fL (ref 78.0–100.0)
Platelets: 262 10*3/uL (ref 150–400)
RBC: 4.85 MIL/uL (ref 4.22–5.81)
RDW: 13 % (ref 11.5–15.5)
WBC: 10.7 10*3/uL — AB (ref 4.0–10.5)

## 2017-04-12 LAB — LIPASE, BLOOD: Lipase: 19 U/L (ref 11–51)

## 2017-04-12 MED ORDER — LORAZEPAM 2 MG/ML IJ SOLN
1.0000 mg | Freq: Once | INTRAMUSCULAR | Status: AC
  Administered 2017-04-12: 1 mg via INTRAVENOUS
  Filled 2017-04-12: qty 1

## 2017-04-12 MED ORDER — SODIUM CHLORIDE 0.9 % IV BOLUS (SEPSIS)
2000.0000 mL | Freq: Once | INTRAVENOUS | Status: AC
  Administered 2017-04-12: 2000 mL via INTRAVENOUS

## 2017-04-12 MED ORDER — PROMETHAZINE HCL 25 MG PO TABS: 25.0000 mg | ORAL_TABLET | Freq: Four times a day (QID) | ORAL | 0 refills | Status: DC | PRN

## 2017-04-12 MED ORDER — ONDANSETRON 4 MG PO TBDP
4.0000 mg | ORAL_TABLET | Freq: Once | ORAL | Status: AC | PRN
  Administered 2017-04-12: 4 mg via ORAL
  Filled 2017-04-12: qty 1

## 2017-04-12 MED ORDER — DIPHENHYDRAMINE HCL 50 MG/ML IJ SOLN
25.0000 mg | Freq: Once | INTRAMUSCULAR | Status: AC
  Administered 2017-04-12: 25 mg via INTRAVENOUS
  Filled 2017-04-12: qty 1

## 2017-04-12 MED ORDER — PROMETHAZINE HCL 25 MG/ML IJ SOLN
12.5000 mg | Freq: Once | INTRAMUSCULAR | Status: AC
  Administered 2017-04-12: 12.5 mg via INTRAVENOUS
  Filled 2017-04-12: qty 1

## 2017-04-12 NOTE — Discharge Instructions (Signed)
Dude--Stop smoking marijuana!!! Daily marijuana causes recurrent episodes of vomiting. This WILL continue to happen if you continue to smoke marijuana daily.

## 2017-04-12 NOTE — ED Triage Notes (Signed)
Pt c/o emesis, diarrhea, and generalized abdominal pain onset Sunday. No blood in emesis or stool. Took prescription metoclopramide for nausea this morning. No sick contacts.

## 2017-04-12 NOTE — ED Provider Notes (Signed)
[facility] DEPT Provider Note   CSN: [id number] Arrival date & time: 04/12/17  0744     History   Chief Complaint Chief Complaint  Patient presents with  . Emesis    HPI Lee Greer is a 51 y.o. male.Chief complaint is vomiting  HPI 51 year old male. Daily marijuana user. Multiple visits over the last 1 year with similar episodes. He started having nausea and vomiting on Sunday. Vomited today yesterday and today. No pain but occasional cramping. One loose stool. No hematemesis.  Past Medical History:  Diagnosis Date  . Gastroparesis     Patient Active Problem List   Diagnosis Date Noted  . Essential hypertension 02/17/2015  . Nausea with vomiting 02/17/2015    History reviewed. No pertinent surgical history.     Home Medications    Prior to Admission medications   Medication Sig Start Date End Date Taking? Authorizing Provider  Ibuprofen (ADVIL PO) Take 2 tablets by mouth daily as needed (pain).   Yes [provider]  metoCLOPramide (REGLAN) 10 MG tablet Take 1 tablet (10 mg total) by mouth every 6 (six) hours as needed for nausea or vomiting. 04/19/16  Yes Duffy Bruce, MD  diphenhydrAMINE (BENADRYL) 25 mg capsule Take 1 capsule (25 mg total) by mouth every 6 (six) hours as needed (Take with reglan for nausea). Patient not taking: Reported on 04/12/2017 04/19/16   Duffy Bruce, MD  famotidine (PEPCID) 20 MG tablet Take 1 tablet (20 mg total) by mouth 2 (two) times daily. Patient not taking: Reported on 04/12/2017 04/26/16   Frederica Kuster, PA-C  metoCLOPramide (REGLAN) 10 MG tablet Take 1 tablet (10 mg total) by mouth every 8 (eight) hours as needed for nausea. Patient not taking: Reported on 04/12/2017 12/27/16   Virgel Manifold, MD  ondansetron (ZOFRAN) 8 MG tablet Take 1 tablet (8 mg total) by mouth every 8 (eight) hours as needed for nausea or vomiting. Patient not taking: Reported on 04/26/2016 08/31/15   Street, [place], [place]  Potassium Chloride  ER 20 MEQ TBCR Take 20 mEq by mouth 2 (two) times daily. Patient not taking: Reported on 04/12/2017 04/26/16   Frederica Kuster, PA-C  promethazine (PHENERGAN) 25 MG tablet Take 1 tablet (25 mg total) by mouth every 6 (six) hours as needed for nausea or vomiting. 04/12/17   Tanna Furry, MD    Family History History reviewed. No pertinent family history.  Social History Social History  Substance Use Topics  . Smoking status: Never Smoker  . Smokeless tobacco: Never Used  . Alcohol use No     Allergies   Patient has no known allergies.   Review of Systems Review of Systems  Constitutional: Negative for appetite change, chills, diaphoresis, fatigue and fever.  HENT: Negative for mouth sores, sore throat and trouble swallowing.   Eyes: Negative for visual disturbance.  Respiratory: Negative for cough, chest tightness, shortness of breath and wheezing.   Cardiovascular: Negative for chest pain.  Gastrointestinal: Positive for abdominal pain and nausea. Negative for abdominal distention, diarrhea and vomiting.  Endocrine: Negative for polydipsia, polyphagia and polyuria.  Genitourinary: Negative for dysuria, frequency and hematuria.  Musculoskeletal: Negative for gait problem.  Skin: Negative for color change, pallor and rash.  Neurological: Negative for dizziness, syncope, light-headedness and headaches.  Hematological: Does not bruise/bleed easily.  Psychiatric/Behavioral: Negative for behavioral problems and confusion.     Physical Exam Updated Vital Signs BP (!) 133/103 (BP Location: Left Arm)   Pulse 78   Temp 97.6  F (36.4 C) (Oral)   Resp 16   SpO2 100%   Physical Exam  Constitutional: He is oriented to person, place, and time. He appears well-developed and well-nourished. No distress.  HENT:  Head: Normocephalic.  Eyes: Pupils are equal, round, and reactive to light. Conjunctivae are normal. No scleral icterus.  Neck: Normal range of motion. Neck supple. No  thyromegaly present.  Cardiovascular: Normal rate and regular rhythm.  Exam reveals no gallop and no friction rub.   No murmur heard. Pulmonary/Chest: Effort normal and breath sounds normal. No respiratory distress. He has no wheezes. He has no rales.  Abdominal: Soft. Bowel sounds are normal. He exhibits no distension. There is no tenderness. There is no rebound.  Soft. Benign. Normal active bowel sounds. No focal tenderness.  Musculoskeletal: Normal range of motion.  Neurological: He is alert and oriented to person, place, and time.  Skin: Skin is warm and dry. No rash noted.  Psychiatric: He has a normal mood and affect. His behavior is normal.     ED Treatments / Results  Labs (all labs ordered are listed, but only abnormal results are displayed) Labs Reviewed  COMPREHENSIVE METABOLIC PANEL - Abnormal; Notable for the following:       Result Value   CO2 21 (*)    Glucose, Bld 115 (*)    BUN 32 (*)    Total Protein 10.5 (*)    Albumin 5.8 (*)    Anion gap 16 (*)    All other components within normal limits  CBC - Abnormal; Notable for the following:    WBC 10.7 (*)    All other components within normal limits  LIPASE, BLOOD    EKG  EKG Interpretation None       Radiology No results found.  Procedures Procedures (including critical care time)  Medications Ordered in ED Medications  ondansetron (ZOFRAN-ODT) disintegrating tablet 4 mg (4 mg Oral Given 04/12/17 0756)  sodium chloride 0.9 % bolus 2,000 mL (2,000 mLs Intravenous New Bag/Given 04/12/17 1013)  LORazepam (ATIVAN) injection 1 mg (1 mg Intravenous Given 04/12/17 1012)  diphenhydrAMINE (BENADRYL) injection 25 mg (25 mg Intravenous Given 04/12/17 1012)  promethazine (PHENERGAN) injection 12.5 mg (12.5 mg Intravenous Given 04/12/17 1013)     Initial Impression / Assessment and Plan / ED Course  I have reviewed the triage vital signs and the nursing notes.  Pertinent labs & imaging results that were available  during my care of the patient were reviewed by me and considered in my medical decision making (see chart for details).    IV placed. Given IV fluids. Given Ativan, Phenergan, Benadryl. No additional vomiting. Reassuring labs. I have re-aerated to him that this is likely secondary to chronic THC use.  Final Clinical Impressions(s) / ED Diagnoses   Final diagnoses:  Cannabinoid hyperemesis syndrome (HCC)    New Prescriptions New Prescriptions   PROMETHAZINE (PHENERGAN) 25 MG TABLET    Take 1 tablet (25 mg total) by mouth every 6 (six) hours as needed for nausea or vomiting.     Tanna Furry, MD 04/12/17 [phone]

## 2017-04-13 ENCOUNTER — Emergency Department (HOSPITAL_COMMUNITY): Payer: Self-pay

## 2017-04-13 ENCOUNTER — Encounter (HOSPITAL_COMMUNITY): Payer: Self-pay | Admitting: *Deleted

## 2017-04-13 ENCOUNTER — Observation Stay (HOSPITAL_COMMUNITY)
Admission: EM | Admit: 2017-04-13 | Discharge: 2017-04-14 | Disposition: A | Discharge status: Home / Self Care | Payer: Self-pay | Attending: Internal Medicine | Admitting: Internal Medicine

## 2017-04-13 DIAGNOSIS — G43A1 Cyclical vomiting, intractable: Secondary | ICD-10-CM

## 2017-04-13 DIAGNOSIS — K3184 Gastroparesis: Secondary | ICD-10-CM | POA: Insufficient documentation

## 2017-04-13 DIAGNOSIS — R112 Nausea with vomiting, unspecified: Secondary | ICD-10-CM | POA: Diagnosis present

## 2017-04-13 DIAGNOSIS — Z8249 Family history of ischemic heart disease and other diseases of the circulatory system: Secondary | ICD-10-CM | POA: Insufficient documentation

## 2017-04-13 DIAGNOSIS — I10 Essential (primary) hypertension: Secondary | ICD-10-CM | POA: Insufficient documentation

## 2017-04-13 DIAGNOSIS — Z79899 Other long term (current) drug therapy: Secondary | ICD-10-CM | POA: Insufficient documentation

## 2017-04-13 DIAGNOSIS — N179 Acute kidney failure, unspecified: Secondary | ICD-10-CM | POA: Insufficient documentation

## 2017-04-13 DIAGNOSIS — F1210 Cannabis abuse, uncomplicated: Secondary | ICD-10-CM | POA: Insufficient documentation

## 2017-04-13 DIAGNOSIS — E860 Dehydration: Principal | ICD-10-CM | POA: Diagnosis present

## 2017-04-13 DIAGNOSIS — E872 Acidosis: Secondary | ICD-10-CM | POA: Insufficient documentation

## 2017-04-13 DIAGNOSIS — D649 Anemia, unspecified: Secondary | ICD-10-CM | POA: Insufficient documentation

## 2017-04-13 HISTORY — DX: Acute kidney failure, unspecified: N17.9

## 2017-04-13 LAB — CBC
HCT: 44 % (ref 39.0–52.0)
HEMOGLOBIN: 15.7 g/dL (ref 13.0–17.0)
MCH: 31.5 pg (ref 26.0–34.0)
MCHC: 35.7 g/dL (ref 30.0–36.0)
MCV: 88.4 fL (ref 78.0–100.0)
PLATELETS: 251 10*3/uL (ref 150–400)
RBC: 4.98 MIL/uL (ref 4.22–5.81)
RDW: 12.9 % (ref 11.5–15.5)
WBC: 12.1 10*3/uL — AB (ref 4.0–10.5)

## 2017-04-13 LAB — COMPREHENSIVE METABOLIC PANEL
ALK PHOS: 72 U/L (ref 38–126)
ALT: 23 U/L (ref 17–63)
AST: 32 U/L (ref 15–41)
Albumin: 5.1 g/dL — ABNORMAL HIGH (ref 3.5–5.0)
Anion gap: 15 (ref 5–15)
BUN: 41 mg/dL — ABNORMAL HIGH (ref 6–20)
CALCIUM: 9.9 mg/dL (ref 8.9–10.3)
CO2: 19 mmol/L — AB (ref 22–32)
Chloride: 105 mmol/L (ref 101–111)
Creatinine, Ser: 1.78 mg/dL — ABNORMAL HIGH (ref 0.61–1.24)
GFR calc Af Amer: 50 mL/min — ABNORMAL LOW (ref >60)
GFR, EST NON AFRICAN AMERICAN: 43 mL/min — AB (ref >60)
Glucose, Bld: 132 mg/dL — ABNORMAL HIGH (ref 65–99)
Potassium: 4.2 mmol/L (ref 3.5–5.1)
Sodium: 139 mmol/L (ref 135–145)
Total Bilirubin: 1.1 mg/dL (ref 0.3–1.2)
Total Protein: 9.5 g/dL — ABNORMAL HIGH (ref 6.5–8.1)

## 2017-04-13 LAB — LIPASE, BLOOD: Lipase: 20 U/L (ref 11–51)

## 2017-04-13 LAB — I-STAT TROPONIN, ED: TROPONIN I, POC: 0.01 ng/mL (ref 0.00–0.08)

## 2017-04-13 MED ORDER — PANTOPRAZOLE SODIUM 40 MG IV SOLR
80.0000 mg | Freq: Once | INTRAVENOUS | Status: AC
  Administered 2017-04-13: 80 mg via INTRAVENOUS
  Filled 2017-04-13: qty 80

## 2017-04-13 MED ORDER — SODIUM CHLORIDE 0.9 % IV SOLN
INTRAVENOUS | Status: DC
  Administered 2017-04-13: via INTRAVENOUS

## 2017-04-13 MED ORDER — SODIUM CHLORIDE 0.9 % IV BOLUS (SEPSIS)
2000.0000 mL | Freq: Once | INTRAVENOUS | Status: AC
  Administered 2017-04-13: 2000 mL via INTRAVENOUS

## 2017-04-13 NOTE — ED Provider Notes (Signed)
[facility] [place] DEPT Provider Note   CSN: [id number] Arrival date & time: 04/13/17  1213     History   Chief Complaint Chief Complaint  Patient presents with  . Emesis    HPI TANNER YELEY is a 51 y.o. male.Patient developed vomiting onset 3 days ago. He vomited one time yesterday and 3 times today. He was seen at [facility] emergency department yesterday. With IV fluids and Phenergan. He went home with a prescription for Phenergan which she has not filled. No treatment today sleeping the hospital yesterday Has any abdominal pain . He only has abdominal pain when he vomits. denies chest pain denies headache denies fever. No other associated symptoms. Nothing makes symptoms better or worse. He is not nauseated at present. Denies hematemesis. Last bowel movement today, normal  HPI  Past Medical History:  Diagnosis Date  . Gastroparesis     Patient Active Problem List   Diagnosis Date Noted  . Essential hypertension 02/17/2015  . Nausea with vomiting 02/17/2015    No past surgical history on file.     Home Medications    Prior to Admission medications   Medication Sig Start Date End Date Taking? Authorizing Provider  diphenhydrAMINE (BENADRYL) 25 mg capsule Take 1 capsule (25 mg total) by mouth every 6 (six) hours as needed (Take with reglan for nausea). Patient not taking: Reported on 04/12/2017 04/19/16   Duffy Bruce, MD  famotidine (PEPCID) 20 MG tablet Take 1 tablet (20 mg total) by mouth 2 (two) times daily. Patient not taking: Reported on 04/12/2017 04/26/16   Frederica Kuster, PA-C  Ibuprofen (ADVIL PO) Take 2 tablets by mouth daily as needed (pain).    [provider]  metoCLOPramide (REGLAN) 10 MG tablet Take 1 tablet (10 mg total) by mouth every 6 (six) hours as needed for nausea or vomiting. 04/19/16   Duffy Bruce, MD  metoCLOPramide (REGLAN) 10 MG tablet Take 1 tablet (10 mg total) by mouth every 8 (eight) hours as needed for  nausea. Patient not taking: Reported on 04/12/2017 12/27/16   Virgel Manifold, MD  ondansetron (ZOFRAN) 8 MG tablet Take 1 tablet (8 mg total) by mouth every 8 (eight) hours as needed for nausea or vomiting. Patient not taking: Reported on 04/26/2016 08/31/15   Street, [place], [place]  Potassium Chloride ER 20 MEQ TBCR Take 20 mEq by mouth 2 (two) times daily. Patient not taking: Reported on 04/12/2017 04/26/16   Frederica Kuster, PA-C  promethazine (PHENERGAN) 25 MG tablet Take 1 tablet (25 mg total) by mouth every 6 (six) hours as needed for nausea or vomiting. 04/12/17   Tanna Furry, MD    Family History No family history on file.  Social History Social History  Substance Use Topics  . Smoking status: Never Smoker  . Smokeless tobacco: Never Used  . Alcohol use No    Daily marijuana use Allergies   Patient has no known allergies.   Review of Systems Review of Systems  Gastrointestinal: Positive for vomiting.  All other systems reviewed and are negative.    Physical Exam Updated Vital Signs BP (!) 159/117 (BP Location: Right Arm)   Pulse 91   Temp 98.2 F (36.8 C) (Oral)   Resp 16   Ht 6\' 1"  (1.854 m)   Wt 61.2 kg (135 lb)   SpO2 100%   BMI 17.81 kg/m   Physical Exam  Constitutional: He appears well-developed and well-nourished.  HENT:  Head: Normocephalic and atraumatic.  Eyes: Pupils  are equal, round, and reactive to light. Conjunctivae are normal.  Neck: Neck supple. No tracheal deviation present. No thyromegaly present.  Cardiovascular: Normal rate and regular rhythm.   No murmur heard. Pulmonary/Chest: Effort normal and breath sounds normal.  Abdominal: Soft. Bowel sounds are normal. He exhibits no distension. There is no tenderness.  Musculoskeletal: Normal range of motion. He exhibits no edema or tenderness.  Neurological: He is alert. Coordination normal.  Skin: Skin is warm and dry. No rash noted.  Psychiatric: He has a normal mood and affect.  Nursing note  and vitals reviewed.    ED Treatments / Results  Labs (all labs ordered are listed, but only abnormal results are displayed) Labs Reviewed  COMPREHENSIVE METABOLIC PANEL - Abnormal; Notable for the following:       Result Value   CO2 19 (*)    Glucose, Bld 132 (*)    BUN 41 (*)    Creatinine, Ser 1.78 (*)    Total Protein 9.5 (*)    Albumin 5.1 (*)    GFR calc non Af Amer 43 (*)    GFR calc Af Amer 50 (*)    All other components within normal limits  CBC - Abnormal; Notable for the following:    WBC 12.1 (*)    All other components within normal limits  LIPASE, BLOOD  I-STAT TROPONIN, ED    EKG  EKG Interpretation None       Radiology No results found.  Procedures Procedures (including critical care time)  Medications Ordered in ED Medications  sodium chloride 0.9 % bolus 2,000 mL (2,000 mLs Intravenous New Bag/Given 04/13/17 2023)    Results for orders placed or performed during the hospital encounter of 04/13/17  Lipase, blood  Result Value Ref Range   Lipase 20 11 - 51 U/L  Comprehensive metabolic panel  Result Value Ref Range   Sodium 139 135 - 145 mmol/L   Potassium 4.2 3.5 - 5.1 mmol/L   Chloride 105 101 - 111 mmol/L   CO2 19 (L) 22 - 32 mmol/L   Glucose, Bld 132 (H) 65 - 99 mg/dL   BUN 41 (H) 6 - 20 mg/dL   Creatinine, Ser 1.78 (H) 0.61 - 1.24 mg/dL   Calcium 9.9 8.9 - 10.3 mg/dL   Total Protein 9.5 (H) 6.5 - 8.1 g/dL   Albumin 5.1 (H) 3.5 - 5.0 g/dL   AST 32 15 - 41 U/L   ALT 23 17 - 63 U/L   Alkaline Phosphatase 72 38 - 126 U/L   Total Bilirubin 1.1 0.3 - 1.2 mg/dL   GFR calc non Af Amer 43 (L) >60 mL/min   GFR calc Af Amer 50 (L) >60 mL/min   Anion gap 15 5 - 15  CBC  Result Value Ref Range   WBC 12.1 (H) 4.0 - 10.5 K/uL   RBC 4.98 4.22 - 5.81 MIL/uL   Hemoglobin 15.7 13.0 - 17.0 g/dL   HCT 44.0 39.0 - 52.0 %   MCV 88.4 78.0 - 100.0 fL   MCH 31.5 26.0 - 34.0 pg   MCHC 35.7 30.0 - 36.0 g/dL   RDW 12.9 11.5 - 15.5 %   Platelets 251  150 - 400 K/uL  I-stat troponin, ED  Result Value Ref Range   Troponin i, poc 0.01 0.00 - 0.08 ng/mL   Comment 3          X-rays viewed by me Dg Abd Acute W/chest  Result Date: 04/13/2017 CLINICAL  DATA:  Vomiting. EXAM: DG ABDOMEN ACUTE W/ 1V CHEST COMPARISON:  01/22/2013 FINDINGS: Normal bowel gas pattern. No free air. No evidence of renal or ureteral stones. Soft tissues are unremarkable. Normal heart, mediastinum and hila. Lungs are hyperexpanded but clear. Skeletal structures are unremarkable. IMPRESSION: Negative abdominal radiographs.  No acute cardiopulmonary disease. Electronically Signed   By: Lajean Manes M.D.   On: 04/13/2017 21:44   Initial Impression / Assessment and Plan / ED Course  I have reviewed the triage vital signs and the nursing notes.  Pertinent labs & imaging results that were available during my care of the patient were reviewed by me and considered in my medical decision making (see chart for details).     10:35 p.m. feels improved after treatment with intravenous hydration. Not nauseated presently. pt with acute kidney injury likely secondary to vomiting and dehydration   1:20 PM he complains of vague abdominal discomfort with deep breaths. Denies shortness of breath. Intravenous Protonix ordered. Plan continue intravenous hydration. Patient should have renal function rechecked after adequate hydration. Lab work shows acute kidney injury today in comparison to yesterday's lab values Dr Roel Cluck from hospital service consulted and will arrange for overnight stay. Final Clinical Impressions(s) / ED Diagnoses  Diagnosis #1 acute kidney injury #2 dehydration #3 vomiting Final diagnoses:  None    New Prescriptions New Prescriptions   No medications on file     Orlie Dakin, MD 04/13/17 2338

## 2017-04-13 NOTE — ED Notes (Signed)
Patient transported to X-ray 

## 2017-04-13 NOTE — ED Notes (Signed)
Pt stated that he took 2 sips of water, and he feels like he is about to throw it back up. He said he dont want to drink anymore water and loose all the fluids he has been getting.

## 2017-04-13 NOTE — H&P (Signed)
Lee Greer VEL:[id number] DOB: 1965/12/26 DOA: 04/13/2017     PCP: System, Pcp Not In   Outpatient Specialists:  none    Patient coming from:   home Lives alone,      Chief Complaint: nausea vomiting  HPI: Lee Greer is a 51 y.o. male with medical history significant of polysubstance abuse, cyclic vomiting, hypertension    Presented with nausea has been going on for past 2 days patient was seen in emergency department yesterday for persistent nausea he continues to use daily marijuana. He has had multiple visits in the emergency department for nausea and vomiting. Does not associated diarrhea and no hematemesis yesterday and emerged department he was seen and evaluated and given prescription for Phenergan records she did not feel his prescription. Presented today to [facility] similar symptoms. Reports epigastric pain but no chest pain. Denies fevers or chills     IN ER:  Temp (24hrs), Avg:98 F (36.7 C), Min:97.8 F (36.6 C), Max:98.2 F (36.8 C)      on arrival  ED Triage Vitals  Enc Vitals Group     BP 04/13/17 1248 (!) 146/118     Pulse Rate 04/13/17 1248 96     Resp 04/13/17 1248 16     Temp 04/13/17 1248 97.8 F (36.6 C)     Temp Source 04/13/17 1248 Oral     SpO2 04/13/17 1248 100 %     Weight 04/13/17 1249 135 lb (61.2 kg)     Height 04/13/17 1249 6\' 1"  (1.854 m)     Head Circumference --      Peak Flow --      Pain Score 04/13/17 1247 7     Pain Loc --      Pain Edu? --      Excl. in [place]? --   RR 17 95% HR 81 BP 170/99 Trop 0.01 lipase 20 Na 139 K 4.2 Bicarb 19 BUN 41 Cr 1.78 up from 1.15 ALB 5.1 protein 9.5 WBC 12.1 Hg 15.7 KUB - non acute Following Medications were ordered in ER: Medications  pantoprazole (PROTONIX) 80 mg in sodium chloride 0.9 % 100 mL IVPB (not administered)  0.9 %  sodium chloride infusion (not administered)  sodium chloride 0.9 % bolus 2,000 mL (0 mLs Intravenous Stopped 04/13/17 2301)      Hospitalist was called for admission for AKI secondary to cyclic vomiting  Review of Systems:    Pertinent positives include: abdominal pain, nausea, vomiting,  Constitutional:  No weight loss, night sweats, Fevers, chills, fatigue, weight loss  HEENT:  No headaches, Difficulty swallowing,Tooth/dental problems,Sore throat,  No sneezing, itching, ear ache, nasal congestion, post nasal drip,  Cardio-vascular:  No chest pain, Orthopnea, PND, anasarca, dizziness, palpitations.no Bilateral lower extremity swelling  GI:  No heartburn, indigestion,  diarrhea, change in bowel habits, loss of appetite, melena, blood in stool, hematemesis Resp:  no shortness of breath at rest. No dyspnea on exertion, No excess mucus, no productive cough, No non-productive cough, No coughing up of blood.No change in color of mucus.No wheezing. Skin:  no rash or lesions. No jaundice GU:  no dysuria, change in color of urine, no urgency or frequency. No straining to urinate.  No flank pain.  Musculoskeletal:  No joint pain or no joint swelling. No decreased range of motion. No back pain.  Psych:  No change in mood or affect. No depression or anxiety. No memory loss.  Neuro: no localizing neurological complaints,  no tingling, no weakness, no double vision, no gait abnormality, no slurred speech, no confusion  As per HPI otherwise 10 point review of systems negative.   Past Medical History: Past Medical History:  Diagnosis Date  . Gastroparesis    No past surgical history on file.   Social History:  Ambulatory   independently     reports that he has never smoked. He has never used smokeless tobacco. He reports that he does not drink alcohol or use drugs.  Allergies:  No Known Allergies     Family History:   Family History  Problem Relation Age of Onset  . CAD Mother   . Diabetes Neg Hx   . Stroke Neg Hx     Medications: Prior to Admission medications   Medication Sig Start Date End  Date Taking? Authorizing Provider  diphenhydrAMINE (BENADRYL) 25 mg capsule Take 1 capsule (25 mg total) by mouth every 6 (six) hours as needed (Take with reglan for nausea). Patient not taking: Reported on 04/12/2017 04/19/16   Duffy Bruce, MD  famotidine (PEPCID) 20 MG tablet Take 1 tablet (20 mg total) by mouth 2 (two) times daily. Patient not taking: Reported on 04/12/2017 04/26/16   Frederica Kuster, PA-C  Ibuprofen (ADVIL PO) Take 2 tablets by mouth daily as needed (pain).    [provider]  metoCLOPramide (REGLAN) 10 MG tablet Take 1 tablet (10 mg total) by mouth every 6 (six) hours as needed for nausea or vomiting. 04/19/16   Duffy Bruce, MD  metoCLOPramide (REGLAN) 10 MG tablet Take 1 tablet (10 mg total) by mouth every 8 (eight) hours as needed for nausea. Patient not taking: Reported on 04/12/2017 12/27/16   Virgel Manifold, MD  ondansetron (ZOFRAN) 8 MG tablet Take 1 tablet (8 mg total) by mouth every 8 (eight) hours as needed for nausea or vomiting. Patient not taking: Reported on 04/26/2016 08/31/15   Street, [place], [place]  Potassium Chloride ER 20 MEQ TBCR Take 20 mEq by mouth 2 (two) times daily. Patient not taking: Reported on 04/12/2017 04/26/16   Frederica Kuster, PA-C  promethazine (PHENERGAN) 25 MG tablet Take 1 tablet (25 mg total) by mouth every 6 (six) hours as needed for nausea or vomiting. 04/12/17   Tanna Furry, MD    Physical Exam: Patient Vitals for the past 24 hrs:  BP Temp Temp src Pulse Resp SpO2 Height Weight  04/13/17 2315 (!) 170/99 - - - 17 - - -  04/13/17 2245 (!) 180/104 - - 81 14 95 % - -  04/13/17 1919 (!) 159/117 98.2 F (36.8 C) Oral 91 16 100 % - -  04/13/17 1249 - - - - - - 6\' 1"  (1.854 m) 61.2 kg (135 lb)  04/13/17 1248 (!) 146/118 97.8 F (36.6 C) Oral 96 16 100 % - -    1. General:  in No Acute distress 2. Psychological: Alert and  Oriented 3. Head/ENT:    Dry Mucous Membranes                          Head Non traumatic, neck supple                           Normal  Dentition 4. SKIN:  decreased Skin turgor,  Skin clean Dry and intact no rash 5. Heart: Regular rate and rhythm no  Murmur, Rub or gallop 6. Lungs:  no wheezes  or crackles   7. Abdomen: Soft, non-tender, Non distended 8. Lower extremities: no clubbing, cyanosis, or edema 9. Neurologically Grossly intact, moving all 4 extremities equally  10. MSK: Normal range of motion   body mass index is 17.81 kg/m.  Labs on Admission:   Labs on Admission: I have personally reviewed following labs and imaging studies  CBC:  Recent Labs Lab 04/12/17 1013 04/13/17 1304  WBC 10.7* 12.1*  HGB 15.5 15.7  HCT 43.0 44.0  MCV 88.7 88.4  PLT 262 425   Basic Metabolic Panel:  Recent Labs Lab 04/12/17 1013 04/13/17 1304  NA 138 139  K 3.8 4.2  CL 101 105  CO2 21* 19*  GLUCOSE 115* 132*  BUN 32* 41*  CREATININE 1.15 1.78*  CALCIUM 10.1 9.9   GFR: Estimated Creatinine Clearance: 43 mL/min (A) (by C-G formula based on SCr of 1.78 mg/dL (H)). Liver Function Tests:  Recent Labs Lab 04/12/17 1013 04/13/17 1304  AST 28 32  ALT 23 23  ALKPHOS 72 72  BILITOT 1.1 1.1  PROT 10.5* 9.5*  ALBUMIN 5.8* 5.1*    Recent Labs Lab 04/12/17 1013 04/13/17 1304  LIPASE 19 20   No results for input(s): AMMONIA in the last 168 hours. Coagulation Profile: No results for input(s): INR, PROTIME in the last 168 hours. Cardiac Enzymes: No results for input(s): CKTOTAL, CKMB, CKMBINDEX, TROPONINI in the last 168 hours. BNP (last 3 results) No results for input(s): PROBNP in the last 8760 hours. HbA1C: No results for input(s): HGBA1C in the last 72 hours. CBG: No results for input(s): GLUCAP in the last 168 hours. Lipid Profile: No results for input(s): CHOL, HDL, LDLCALC, TRIG, CHOLHDL, LDLDIRECT in the last 72 hours. Thyroid Function Tests: No results for input(s): TSH, T4TOTAL, FREET4, T3FREE, THYROIDAB in the last 72 hours. Anemia Panel: No results for  input(s): VITAMINB12, FOLATE, FERRITIN, TIBC, IRON, RETICCTPCT in the last 72 hours. Urine analysis: Sepsis Labs: @LABRCNTIP (procalcitonin:4,lacticidven:4) )No results found for this or any previous visit (from the past 240 hour(s)).    UA  ordered  No results found for: HGBA1C  Estimated Creatinine Clearance: 43 mL/min (A) (by C-G formula based on SCr of 1.78 mg/dL (H)).  BNP (last 3 results) No results for input(s): PROBNP in the last 8760 hours.   ECG REPORT  Independently reviewed Rate:80  Rhythm: NSR with Right atrial enlargement ST&T Change: No acute ischemic changes   QTC 468  Filed Weights   04/13/17 1249  Weight: 61.2 kg (135 lb)     Cultures:    Component Value Date/Time   SDES URINE, CLEAN CATCH 04/19/2016 0107   SPECREQUEST NONE 04/19/2016 0107   CULT NO GROWTH Performed at [facility]  04/19/2016 0107   REPTSTATUS 04/20/2016 FINAL 04/19/2016 0107     Radiological Exams on Admission: Dg Abd Acute W/chest  Result Date: 04/13/2017 CLINICAL DATA:  Vomiting. EXAM: DG ABDOMEN ACUTE W/ 1V CHEST COMPARISON:  01/22/2013 FINDINGS: Normal bowel gas pattern. No free air. No evidence of renal or ureteral stones. Soft tissues are unremarkable. Normal heart, mediastinum and hila. Lungs are hyperexpanded but clear. Skeletal structures are unremarkable. IMPRESSION: Negative abdominal radiographs.  No acute cardiopulmonary disease. Electronically Signed   By: Lajean Manes M.D.   On: 04/13/2017 21:44    Chart has been reviewed    Assessment/Plan  52 y.o. male with medical history significant of polysubstance abuse, cyclic vomiting, hypertension Admitted for a care secondary to dehydration secondary to cyclic vomiting  Present on Admission: . Nausea with vomiting - most likely secondary to marijuana abuse symptomatic management currently improving advance diet gently . Essential hypertension - not on home medications continue to monitor . AKI (acute kidney  injury) ([place]) most likely secondary to dehydration we'll rehydrate him for kidney function   . Dehydration and intercurrent nausea and vomiting rehydrate and follow   Other plan as per orders.  DVT prophylaxis:  SCD    Code Status:  FULL CODE   as per patient    Family Communication:   Family not at  Bedside    Disposition Plan:    To home once workup is complete and patient is stable                              Consults called: none   Admission status:    obs   Level of care     medical floor             I have spent a total of 56 min on this admission  Dasja Brase 04/14/2017, 1:01 AM    [facility]  Pager [phone]   after 2 AM please page floor coverage PA If 7AM-7PM, please contact the day team taking care of the patient  [email or web address]  Password [email or web address]

## 2017-04-13 NOTE — ED Triage Notes (Signed)
Pt was tx yesterday at [facility] for emesis.  States has been taking zofran with no relief.

## 2017-04-13 NOTE — ED Notes (Signed)
Per mini lab, not enough blood in tube to run I-stat lab. Will redraw.

## 2017-04-13 NOTE — ED Notes (Addendum)
Pt refused to put on hospital gown. States he has been here for 7 hours and wants and IV with fluids and something to help his stomach pain.

## 2017-04-14 ENCOUNTER — Encounter (HOSPITAL_COMMUNITY): Payer: Self-pay | Admitting: Internal Medicine

## 2017-04-14 DIAGNOSIS — E860 Dehydration: Secondary | ICD-10-CM

## 2017-04-14 DIAGNOSIS — F1220 Cannabis dependence, uncomplicated: Secondary | ICD-10-CM

## 2017-04-14 DIAGNOSIS — N179 Acute kidney failure, unspecified: Secondary | ICD-10-CM

## 2017-04-14 DIAGNOSIS — R112 Nausea with vomiting, unspecified: Secondary | ICD-10-CM

## 2017-04-14 LAB — BASIC METABOLIC PANEL
Anion gap: 6 (ref 5–15)
BUN: 23 mg/dL — AB (ref 6–20)
CHLORIDE: 107 mmol/L (ref 101–111)
CO2: 23 mmol/L (ref 22–32)
Calcium: 8.5 mg/dL — ABNORMAL LOW (ref 8.9–10.3)
Creatinine, Ser: 0.91 mg/dL (ref 0.61–1.24)
GFR calc Af Amer: >60 mL/min (ref >60)
GFR calc non Af Amer: >60 mL/min (ref >60)
GLUCOSE: 93 mg/dL (ref 65–99)
POTASSIUM: 3.5 mmol/L (ref 3.5–5.1)
Sodium: 136 mmol/L (ref 135–145)

## 2017-04-14 LAB — CBC
HCT: 35.4 % — ABNORMAL LOW (ref 39.0–52.0)
HEMOGLOBIN: 12.1 g/dL — AB (ref 13.0–17.0)
MCH: 30.6 pg (ref 26.0–34.0)
MCHC: 34.2 g/dL (ref 30.0–36.0)
MCV: 89.6 fL (ref 78.0–100.0)
PLATELETS: 214 10*3/uL (ref 150–400)
RBC: 3.95 MIL/uL — AB (ref 4.22–5.81)
RDW: 13 % (ref 11.5–15.5)
WBC: 9.8 10*3/uL (ref 4.0–10.5)

## 2017-04-14 LAB — URINALYSIS, ROUTINE W REFLEX MICROSCOPIC
Bacteria, UA: NONE SEEN
Bilirubin Urine: NEGATIVE
Glucose, UA: NEGATIVE mg/dL
KETONES UR: 5 mg/dL — AB
Leukocytes, UA: NEGATIVE
Nitrite: NEGATIVE
PROTEIN: NEGATIVE mg/dL
Specific Gravity, Urine: 1.024 (ref 1.005–1.030)
pH: 5 (ref 5.0–8.0)

## 2017-04-14 LAB — PHOSPHORUS: PHOSPHORUS: 3.2 mg/dL (ref 2.5–4.6)

## 2017-04-14 LAB — COMPREHENSIVE METABOLIC PANEL
ALBUMIN: 3.8 g/dL (ref 3.5–5.0)
ALT: 19 U/L (ref 17–63)
AST: 29 U/L (ref 15–41)
Alkaline Phosphatase: 50 U/L (ref 38–126)
Anion gap: 13 (ref 5–15)
BUN: 34 mg/dL — AB (ref 6–20)
CHLORIDE: 110 mmol/L (ref 101–111)
CO2: 16 mmol/L — AB (ref 22–32)
CREATININE: 1.04 mg/dL (ref 0.61–1.24)
Calcium: 8.2 mg/dL — ABNORMAL LOW (ref 8.9–10.3)
GFR calc Af Amer: >60 mL/min (ref >60)
GFR calc non Af Amer: >60 mL/min (ref >60)
GLUCOSE: 106 mg/dL — AB (ref 65–99)
POTASSIUM: 3.6 mmol/L (ref 3.5–5.1)
Sodium: 139 mmol/L (ref 135–145)
Total Bilirubin: 1.1 mg/dL (ref 0.3–1.2)
Total Protein: 6.6 g/dL (ref 6.5–8.1)

## 2017-04-14 LAB — RAPID URINE DRUG SCREEN, HOSP PERFORMED
AMPHETAMINES: NOT DETECTED
BENZODIAZEPINES: NOT DETECTED
Barbiturates: NOT DETECTED
Cocaine: NOT DETECTED
Opiates: NOT DETECTED
TETRAHYDROCANNABINOL: POSITIVE — AB

## 2017-04-14 LAB — T4, FREE: FREE T4: 0.99 ng/dL (ref 0.61–1.12)

## 2017-04-14 LAB — MAGNESIUM: MAGNESIUM: 2.1 mg/dL (ref 1.7–2.4)

## 2017-04-14 LAB — SODIUM, URINE, RANDOM: SODIUM UR: 148 mmol/L

## 2017-04-14 LAB — HIV ANTIBODY (ROUTINE TESTING W REFLEX): HIV Screen 4th Generation wRfx: NONREACTIVE

## 2017-04-14 LAB — TSH: TSH: 0.345 u[IU]/mL — ABNORMAL LOW (ref 0.350–4.500)

## 2017-04-14 LAB — CREATININE, URINE, RANDOM: CREATININE, URINE: 136.41 mg/dL

## 2017-04-14 MED ORDER — ACETAMINOPHEN 650 MG RE SUPP: 650.0000 mg | Freq: Four times a day (QID) | RECTAL | Status: DC | PRN

## 2017-04-14 MED ORDER — ONDANSETRON HCL 4 MG PO TABS: 4.0000 mg | ORAL_TABLET | Freq: Four times a day (QID) | ORAL | Status: DC | PRN

## 2017-04-14 MED ORDER — ONDANSETRON HCL 4 MG/2ML IJ SOLN: 4.0000 mg | Freq: Four times a day (QID) | INTRAMUSCULAR | Status: DC | PRN

## 2017-04-14 MED ORDER — METOCLOPRAMIDE HCL 5 MG/ML IJ SOLN
5.0000 mg | Freq: Four times a day (QID) | INTRAMUSCULAR | Status: DC | PRN
  Administered 2017-04-14: 5 mg via INTRAVENOUS
  Filled 2017-04-14: qty 2

## 2017-04-14 MED ORDER — HYDROCODONE-ACETAMINOPHEN 5-325 MG PO TABS: 1.0000 | ORAL_TABLET | ORAL | Status: DC | PRN

## 2017-04-14 MED ORDER — ACETAMINOPHEN 325 MG PO TABS: 650.0000 mg | ORAL_TABLET | Freq: Four times a day (QID) | ORAL | Status: DC | PRN

## 2017-04-14 MED ORDER — SODIUM CHLORIDE 0.9 % IV SOLN
INTRAVENOUS | Status: DC
  Administered 2017-04-14 (×2): via INTRAVENOUS

## 2017-04-14 MED ORDER — SODIUM CHLORIDE 0.9% FLUSH
3.0000 mL | Freq: Two times a day (BID) | INTRAVENOUS | Status: DC
  Administered 2017-04-14: 3 mL via INTRAVENOUS

## 2017-04-14 NOTE — Care Management Note (Signed)
Case Management Note  Patient Details  Name: Lee Greer MRN: [id number] Date of Birth: 04-16-1966  Subjective/Objective:                    Action/Plan:  Patient in shower . Left MATCH letter and follow up appointment at bedside. Nurse Vicente Males aware. Expected Discharge Date:                  Expected Discharge Plan:  Home/Self Care  In-House Referral:     Discharge planning Services  CM Consult, [place] [facility], Medication Assistance, [facility]  Post Acute Care Choice:    Choice offered to:     DME Arranged:    DME Agency:     HH Arranged:    HH Agency:     Status of Service:  Completed, signed off  If discussed at [employer], dates discussed:    Additional Comments:  Lee Favre, RN 04/14/2017, 4:57 PM

## 2017-04-14 NOTE — Discharge Instructions (Signed)

## 2017-04-14 NOTE — Discharge Summary (Signed)
Physician Discharge Summary  Lee Greer XVQ:[id number] DOB: 03/27/1966  PCP: Patient, No Pcp Per  Admit date: 04/13/2017 Discharge date: 04/14/2017  Recommendations for Outpatient Follow-up:  1. [facility]/PCP on 04/18/17 at 9:15 AM. Please follow free T3 that was sent from the hospital. 2. Recommend repeating TFTs in 4-6 weeks.  Home Health: None Equipment/Devices: None    Discharge Condition: Improved and stable  CODE STATUS: Full  Diet recommendation: Heart healthy diet.  Discharge Diagnoses:  Active Problems:   Essential hypertension   Nausea with vomiting   AKI (acute kidney injury) ([place])   Dehydration   Brief Summary: 51 year old male with PMH of chronic marijuana abuse, documented gastroparesis, was in his usual state of health until 3 days ago when he started having episodes of nonbloody emesis, seen at [facility] ED on 8/7 and treated with IV fluids and Phenergan, improved and discharged home on Phenergan which he did not fill, presented back to the ED on 8/8 with similar complaints. Denies sick contacts with similar complaints or eating anything unusual. As per EGD, multiple visits over the last 1 year with similar episodes. It was felt that his symptoms were related to chronic THC use. Admitted for observation, evaluation and management.  Assessment and plan:  1. Nausea and vomiting: History of similar episodes in the past. Felt to be related to chronic THC abuse versus gastroparesis. KUB without acute findings. Urine microscopy negative for UTI features. Abstinence was counseled multiple times by multiple providers. Treated supportively with bowel rest, IV fluids, when necessary antiemetics and a dose of PPI. Diet was gradually advanced. He has tolerated soft diet. He has not complained of any nausea, vomiting, abdominal pain since 3 AM today. DC Reglan but continue when necessary Phenergan, DC NSAIDs at discharge. 2. Acute kidney injury:  Secondary to dehydration from GI losses and poor oral intake. Resolved. 3. Dehydration: Resolved after IV fluids. 4. THC abuse, chronic: UDS positive for THC only. Extensively counseled regarding cessation and he verbalized understanding. He states that after current acute illness, he is fully ready to quit. HIV screen: Nonreactive. 5. Non-anion gap metabolic acidosis: Secondary to GI losses. Resolved. 6. Elevated blood pressure:? Stress response. Has normalized. Follow during outpatient visit. 7. Abnormal TFTs: TSH low but free T4 normal (TSH 0.345, free T4: 0.99). Free T3 pending . Clinically euthyroid. Follow TFTs in 4-6 weeks.  8. Mild anemia: Likely dilutional from IV fluids. Can periodically follow CBCs as outpatient. No bleeding reported.   Consultations:  None  Procedures:  None   Discharge Instructions  Discharge Instructions    Activity as tolerated - No restrictions    Complete by:  As directed    Call MD for:  persistant nausea and vomiting    Complete by:  As directed    Call MD for:  severe uncontrolled pain    Complete by:  As directed    Diet - low sodium heart healthy    Complete by:  As directed        Medication List    STOP taking these medications   ibuprofen 200 MG tablet Commonly known as:  ADVIL,MOTRIN   metoCLOPramide 10 MG tablet Commonly known as:  REGLAN     TAKE these medications   acetaminophen 325 MG tablet Commonly known as:  TYLENOL Take 2 tablets (650 mg total) by mouth every 6 (six) hours as needed for mild pain or moderate pain (or Fever >/= 101).   promethazine 25  MG tablet Commonly known as:  PHENERGAN Take 1 tablet (25 mg total) by mouth every 6 (six) hours as needed for nausea or vomiting.      Follow-up Information    [facility] Follow up.   Why:  Hospital follow up appointment April 18, 2017 at 0915 am  Contact information: [place] [id number]         No Known  Allergies    Procedures/Studies: Dg Abd Acute W/chest  Result Date: 04/13/2017 CLINICAL DATA:  Vomiting. EXAM: DG ABDOMEN ACUTE W/ 1V CHEST COMPARISON:  01/22/2013 FINDINGS: Normal bowel gas pattern. No free air. No evidence of renal or ureteral stones. Soft tissues are unremarkable. Normal heart, mediastinum and hila. Lungs are hyperexpanded but clear. Skeletal structures are unremarkable. IMPRESSION: Negative abdominal radiographs.  No acute cardiopulmonary disease. Electronically Signed   By: Lajean Manes M.D.   On: 04/13/2017 21:44      Subjective: Patient reports no further nausea or vomiting since approximately 3 AM today. Reportedly had a large BM while in ED. States he never had abdominal pain except when vomiting. No fever or chills. Reports smokes 5-7 THC cigars daily and chronically. Denies tobacco or other recreational drug abuse. No dizziness, lightheadedness, chest pain, dyspnea or palpitations. Denies any other complaints. Tolerated soft diet without difficulty.  Discharge Exam:  Vitals:   04/13/17 2315 04/14/17 0341 04/14/17 0713 04/14/17 0856  BP: (!) 170/99 116/65 107/70   Pulse:  75 72 66  Resp: 17 18 20    Temp:    98.1 F (36.7 C)  TempSrc:    Oral  SpO2:  100% 100% 100%  Weight:      Height:        General: Pleasant young male, moderately built and nourished, lying comfortably supine in bed. Oral mucosa moist. Cardiovascular: S1 & S2 heard, RRR, S1/S2 +. No murmurs, rubs, gallops or clicks. No JVD or pedal edema. Respiratory: Clear to auscultation without wheezing, rhonchi or crackles. No increased work of breathing. Abdominal:  Non distended, non tender & soft. No organomegaly or masses appreciated. Normal bowel sounds heard. CNS: Alert and oriented. No focal deficits. Extremities: no edema, no cyanosis    The results of significant diagnostics from this hospitalization (including imaging, microbiology, ancillary and laboratory) are listed below for  reference.      Labs: CBC:  Recent Labs Lab 04/12/17 1013 04/13/17 1304 04/14/17 0413  WBC 10.7* 12.1* 9.8  HGB 15.5 15.7 12.1*  HCT 43.0 44.0 35.4*  MCV 88.7 88.4 89.6  PLT 262 251 500   Basic Metabolic Panel:  Recent Labs Lab 04/12/17 1013 04/13/17 1304 04/14/17 0413 04/14/17 1618  NA 138 139 139 136  K 3.8 4.2 3.6 3.5  CL 101 105 110 107  CO2 21* 19* 16* 23  GLUCOSE 115* 132* 106* 93  BUN 32* 41* 34* 23*  CREATININE 1.15 1.78* 1.04 0.91  CALCIUM 10.1 9.9 8.2* 8.5*  MG  --   --  2.1  --   PHOS  --   --  3.2  --    Liver Function Tests:  Recent Labs Lab 04/12/17 1013 04/13/17 1304 04/14/17 0413  AST 28 32 29  ALT 23 23 19   ALKPHOS 72 72 50  BILITOT 1.1 1.1 1.1  PROT 10.5* 9.5* 6.6  ALBUMIN 5.8* 5.1* 3.8   Thyroid function studies  Recent Labs  04/14/17 0413  TSH 0.345*   Urinalysis    Component  Value Date/Time   COLORURINE YELLOW 04/14/2017 [place] 04/14/2017 1335   LABSPEC 1.024 04/14/2017 1335   PHURINE 5.0 04/14/2017 1335   GLUCOSEU NEGATIVE 04/14/2017 1335   HGBUR SMALL (A) 04/14/2017 1335   BILIRUBINUR NEGATIVE 04/14/2017 1335   BILIRUBINUR small 02/17/2015 1222   KETONESUR 5 (A) 04/14/2017 1335   PROTEINUR NEGATIVE 04/14/2017 1335   UROBILINOGEN 0.2 02/17/2015 1222   UROBILINOGEN 0.2 08/30/2014 0735   NITRITE NEGATIVE 04/14/2017 1335   LEUKOCYTESUR NEGATIVE 04/14/2017 1335      Time coordinating discharge: Over 30 minutes  SIGNED:  Vernell Leep, MD, FACP, FHM. [facility] Pager [phone]  If 7PM-7AM, please contact night-coverage [email or web address] Password [email or web address] 04/14/2017, 5:13 PM

## 2017-04-15 LAB — T3, FREE: T3, Free: 2 pg/mL (ref 2.0–4.4)

## 2017-04-18 ENCOUNTER — Ambulatory Visit: Payer: Self-pay | Admitting: Family Medicine

## 2017-07-06 ENCOUNTER — Encounter (HOSPITAL_COMMUNITY): Payer: Self-pay | Admitting: Emergency Medicine

## 2017-07-06 ENCOUNTER — Emergency Department (HOSPITAL_COMMUNITY)
Admission: EM | Admit: 2017-07-06 | Discharge: 2017-07-06 | Disposition: A | Discharge status: Home / Self Care | Payer: Self-pay | Attending: Emergency Medicine | Admitting: Emergency Medicine

## 2017-07-06 DIAGNOSIS — I10 Essential (primary) hypertension: Secondary | ICD-10-CM | POA: Insufficient documentation

## 2017-07-06 DIAGNOSIS — R197 Diarrhea, unspecified: Secondary | ICD-10-CM | POA: Insufficient documentation

## 2017-07-06 DIAGNOSIS — R109 Unspecified abdominal pain: Secondary | ICD-10-CM | POA: Insufficient documentation

## 2017-07-06 DIAGNOSIS — R112 Nausea with vomiting, unspecified: Secondary | ICD-10-CM | POA: Insufficient documentation

## 2017-07-06 DIAGNOSIS — Z79899 Other long term (current) drug therapy: Secondary | ICD-10-CM | POA: Insufficient documentation

## 2017-07-06 LAB — CBC
HEMATOCRIT: 41.5 % (ref 39.0–52.0)
Hemoglobin: 14.4 g/dL (ref 13.0–17.0)
MCH: 31.5 pg (ref 26.0–34.0)
MCHC: 34.7 g/dL (ref 30.0–36.0)
MCV: 90.8 fL (ref 78.0–100.0)
Platelets: 270 10*3/uL (ref 150–400)
RBC: 4.57 MIL/uL (ref 4.22–5.81)
RDW: 13.2 % (ref 11.5–15.5)
WBC: 11.1 10*3/uL — AB (ref 4.0–10.5)

## 2017-07-06 LAB — COMPREHENSIVE METABOLIC PANEL
ALBUMIN: 5.5 g/dL — AB (ref 3.5–5.0)
ALT: 24 U/L (ref 17–63)
AST: 32 U/L (ref 15–41)
Alkaline Phosphatase: 78 U/L (ref 38–126)
Anion gap: 13 (ref 5–15)
BUN: 22 mg/dL — AB (ref 6–20)
CHLORIDE: 105 mmol/L (ref 101–111)
CO2: 23 mmol/L (ref 22–32)
Calcium: 10 mg/dL (ref 8.9–10.3)
Creatinine, Ser: 0.99 mg/dL (ref 0.61–1.24)
GFR calc Af Amer: >60 mL/min (ref >60)
GLUCOSE: 129 mg/dL — AB (ref 65–99)
POTASSIUM: 4.4 mmol/L (ref 3.5–5.1)
SODIUM: 141 mmol/L (ref 135–145)
Total Bilirubin: 0.8 mg/dL (ref 0.3–1.2)
Total Protein: 10 g/dL — ABNORMAL HIGH (ref 6.5–8.1)

## 2017-07-06 LAB — LIPASE, BLOOD: LIPASE: 22 U/L (ref 11–51)

## 2017-07-06 MED ORDER — SODIUM CHLORIDE 0.9 % IV BOLUS (SEPSIS)
1000.0000 mL | Freq: Once | INTRAVENOUS | Status: AC
  Administered 2017-07-06: 1000 mL via INTRAVENOUS

## 2017-07-06 MED ORDER — ONDANSETRON 4 MG PO TBDP: 4.0000 mg | ORAL_TABLET | Freq: Three times a day (TID) | ORAL | 0 refills | Status: DC | PRN

## 2017-07-06 MED ORDER — DICYCLOMINE HCL 20 MG PO TABS: 20.0000 mg | ORAL_TABLET | Freq: Two times a day (BID) | ORAL | 0 refills | Status: DC | PRN

## 2017-07-06 MED ORDER — HALOPERIDOL LACTATE 5 MG/ML IJ SOLN
4.0000 mg | Freq: Once | INTRAMUSCULAR | Status: AC
  Administered 2017-07-06: 4 mg via INTRAVENOUS
  Filled 2017-07-06: qty 1

## 2017-07-06 NOTE — ED Triage Notes (Signed)
Pt states he has had nausea, vomiting, and diarrhea since about 0830 Tuesday morning  Pt states he has some nausea medication at home but it is not holding him

## 2017-07-06 NOTE — ED Provider Notes (Signed)
[facility] DEPT Provider Note   CSN: [id number] Arrival date & time: 07/06/17  0518     History   Chief Complaint Chief Complaint  Patient presents with  . Emesis  . Diarrhea    HPI Lee Greer is a 51 y.o. male.  HPI   51 year old male presents with concern for nausea, vomiting and one episode of diarrhea.  Reports he had one episode of diarrhea yesterday, and then had emesis approximately 20 times, has vomited 3 times since arriving to the emergency department.  Reports he has some pain in his upper abdomen that is exacerbated by vomiting.  Denies any fevers, urinary symptoms, hx of abdominal surgery.  Reports he used to smoke marijuana but quit.  Requests nonopioid medications.   Past Medical History:  Diagnosis Date  . Acute kidney injury ([place]) 04/13/2017   related to "dehydration" (04/14/2017)  . Gastroparesis     Patient Active Problem List   Diagnosis Date Noted  . AKI (acute kidney injury) ([place]) 04/13/2017  . Dehydration 04/13/2017  . Essential hypertension 02/17/2015  . Nausea with vomiting 02/17/2015    History reviewed. No pertinent surgical history.     Home Medications    Prior to Admission medications   Medication Sig Start Date End Date Taking? Authorizing Provider  ibuprofen (ADVIL,MOTRIN) 200 MG tablet Take 400 mg by mouth every 6 (six) hours as needed for fever, headache, mild pain, moderate pain or cramping.   Yes [provider]  acetaminophen (TYLENOL) 325 MG tablet Take 2 tablets (650 mg total) by mouth every 6 (six) hours as needed for mild pain or moderate pain (or Fever >/= 101). Patient not taking: Reported on 07/06/2017 04/14/17   Modena Jansky, MD  dicyclomine (BENTYL) 20 MG tablet Take 1 tablet (20 mg total) by mouth 2 (two) times daily as needed for spasms (nausea and vomitnig). 07/06/17   Gareth Morgan, MD  ondansetron (ZOFRAN ODT) 4 MG disintegrating tablet Take 1 tablet (4 mg total)  by mouth every 8 (eight) hours as needed for nausea or vomiting. 07/06/17   Gareth Morgan, MD  promethazine (PHENERGAN) 25 MG tablet Take 1 tablet (25 mg total) by mouth every 6 (six) hours as needed for nausea or vomiting. Patient not taking: Reported on 07/06/2017 04/12/17   Tanna Furry, MD    Family History Family History  Problem Relation Age of Onset  . CAD Mother   . Diabetes Neg Hx   . Stroke Neg Hx     Social History Social History  Substance Use Topics  . Smoking status: Never Smoker  . Smokeless tobacco: Never Used  . Alcohol use No     Comment: 04/14/2017 "sober since 03/2008"     Allergies   Patient has no known allergies.   Review of Systems Review of Systems  Constitutional: Negative for fever.  HENT: Negative for sore throat.   Eyes: Negative for visual disturbance.  Respiratory: Negative for shortness of breath.   Cardiovascular: Negative for chest pain.  Gastrointestinal: Positive for abdominal pain, diarrhea, nausea and vomiting.  Genitourinary: Negative for difficulty urinating.  Musculoskeletal: Negative for back pain and neck stiffness.  Skin: Negative for rash.  Neurological: Negative for syncope and headaches.     Physical Exam Updated Vital Signs BP 132/90   Pulse 76   Temp 98.8 F (37.1 C) (Oral)   Resp 16   Ht 6' (1.829 m)   Wt 61.2 kg (135 lb)   SpO2 100%  BMI 18.31 kg/m   Physical Exam  Constitutional: He is oriented to person, place, and time. He appears well-developed and well-nourished. He appears distressed (actively dry heaving in room).  HENT:  Head: Normocephalic and atraumatic.  Eyes: Conjunctivae and EOM are normal.  Neck: Normal range of motion.  Cardiovascular: Normal rate, regular rhythm, normal heart sounds and intact distal pulses.  Exam reveals no gallop and no friction rub.   No murmur heard. Pulmonary/Chest: Effort normal and breath sounds normal. No respiratory distress. He has no wheezes. He has no rales.    Abdominal: Soft. He exhibits no distension. There is no tenderness. There is no guarding.  Musculoskeletal: He exhibits no edema.  Neurological: He is alert and oriented to person, place, and time.  Skin: Skin is warm and dry. He is not diaphoretic.  Nursing note and vitals reviewed.    ED Treatments / Results  Labs (all labs ordered are listed, but only abnormal results are displayed) Labs Reviewed  COMPREHENSIVE METABOLIC PANEL - Abnormal; Notable for the following:       Result Value   Glucose, Bld 129 (*)    BUN 22 (*)    Total Protein 10.0 (*)    Albumin 5.5 (*)    All other components within normal limits  CBC - Abnormal; Notable for the following:    WBC 11.1 (*)    All other components within normal limits  LIPASE, BLOOD  URINALYSIS, ROUTINE W REFLEX MICROSCOPIC    EKG  EKG Interpretation None       Radiology No results found.  Procedures Procedures (including critical care time)  Medications Ordered in ED Medications  sodium chloride 0.9 % bolus 1,000 mL (0 mLs Intravenous Stopped 07/06/17 1202)  haloperidol lactate (HALDOL) injection 4 mg (4 mg Intravenous Given 07/06/17 1018)  sodium chloride 0.9 % bolus 1,000 mL (0 mLs Intravenous Stopped 07/06/17 1203)     Initial Impression / Assessment and Plan / ED Course  I have reviewed the triage vital signs and the nursing notes.  Pertinent labs & imaging results that were available during my care of the patient were reviewed by me and considered in my medical decision making (see chart for details).    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  His abdominal exam is benign, low suspicion for appendicitis, cholecystitis, obstruction.  Possible viral gastroenteritis versus cannabinol hyperemesis although patient denies current marijuana use.  Given IV fluids and Haldol for  nausea, vomiting and pain with improvement. Patient tolerating po.  Given prescription for Zofran and Bentyl, recommend continued hydration and follow-up. Patient discharged in stable condition with understanding of reasons to return.    Final Clinical Impressions(s) / ED Diagnoses   Final diagnoses:  Nausea and vomiting, intractability of vomiting not specified, unspecified vomiting type  Diarrhea, unspecified type, likely viral gastroenteritis    New Prescriptions New Prescriptions   DICYCLOMINE (BENTYL) 20 MG TABLET    Take 1 tablet (20 mg total) by mouth 2 (two) times daily as needed for spasms (nausea and vomitnig).   ONDANSETRON (ZOFRAN ODT) 4 MG DISINTEGRATING TABLET    Take 1 tablet (4 mg total) by mouth every 8 (eight) hours as needed for nausea or vomiting.     Gareth Morgan, MD 07/06/17 1306

## 2017-07-06 NOTE — ED Notes (Signed)
ED Provider at bedside. 

## 2017-09-11 ENCOUNTER — Encounter (HOSPITAL_COMMUNITY): Payer: Self-pay | Admitting: Emergency Medicine

## 2017-09-11 ENCOUNTER — Emergency Department (HOSPITAL_COMMUNITY)
Admission: EM | Admit: 2017-09-11 | Discharge: 2017-09-11 | Disposition: A | Discharge status: Home / Self Care | Payer: Self-pay | Attending: Emergency Medicine | Admitting: Emergency Medicine

## 2017-09-11 DIAGNOSIS — R112 Nausea with vomiting, unspecified: Secondary | ICD-10-CM

## 2017-09-11 DIAGNOSIS — I10 Essential (primary) hypertension: Secondary | ICD-10-CM | POA: Insufficient documentation

## 2017-09-11 DIAGNOSIS — E860 Dehydration: Secondary | ICD-10-CM | POA: Insufficient documentation

## 2017-09-11 LAB — URINALYSIS, ROUTINE W REFLEX MICROSCOPIC
Bacteria, UA: NONE SEEN
Bilirubin Urine: NEGATIVE
GLUCOSE, UA: NEGATIVE mg/dL
Ketones, ur: 5 mg/dL — AB
Leukocytes, UA: NEGATIVE
NITRITE: NEGATIVE
PH: 5 (ref 5.0–8.0)
Protein, ur: 100 mg/dL — AB
SPECIFIC GRAVITY, URINE: 1.027 (ref 1.005–1.030)

## 2017-09-11 LAB — CBC WITH DIFFERENTIAL/PLATELET
Basophils Absolute: 0 10*3/uL (ref 0.0–0.1)
Basophils Relative: 0 %
Eosinophils Absolute: 0 10*3/uL (ref 0.0–0.7)
Eosinophils Relative: 0 %
HEMATOCRIT: 43 % (ref 39.0–52.0)
HEMOGLOBIN: 15 g/dL (ref 13.0–17.0)
LYMPHS ABS: 1.4 10*3/uL (ref 0.7–4.0)
Lymphocytes Relative: 15 %
MCH: 31.6 pg (ref 26.0–34.0)
MCHC: 34.9 g/dL (ref 30.0–36.0)
MCV: 90.7 fL (ref 78.0–100.0)
MONO ABS: 0.7 10*3/uL (ref 0.1–1.0)
MONOS PCT: 7 %
NEUTROS PCT: 78 %
Neutro Abs: 7.5 10*3/uL (ref 1.7–7.7)
Platelets: 285 10*3/uL (ref 150–400)
RBC: 4.74 MIL/uL (ref 4.22–5.81)
RDW: 13.1 % (ref 11.5–15.5)
WBC: 9.7 10*3/uL (ref 4.0–10.5)

## 2017-09-11 LAB — COMPREHENSIVE METABOLIC PANEL
ALT: 24 U/L (ref 17–63)
ANION GAP: 13 (ref 5–15)
AST: 35 U/L (ref 15–41)
Albumin: 5.5 g/dL — ABNORMAL HIGH (ref 3.5–5.0)
Alkaline Phosphatase: 77 U/L (ref 38–126)
BILIRUBIN TOTAL: 0.4 mg/dL (ref 0.3–1.2)
BUN: 32 mg/dL — ABNORMAL HIGH (ref 6–20)
CO2: 20 mmol/L — ABNORMAL LOW (ref 22–32)
Calcium: 10.1 mg/dL (ref 8.9–10.3)
Chloride: 106 mmol/L (ref 101–111)
Creatinine, Ser: 1.39 mg/dL — ABNORMAL HIGH (ref 0.61–1.24)
GFR calc non Af Amer: 58 mL/min — ABNORMAL LOW (ref >60)
Glucose, Bld: 148 mg/dL — ABNORMAL HIGH (ref 65–99)
POTASSIUM: 4.4 mmol/L (ref 3.5–5.1)
SODIUM: 139 mmol/L (ref 135–145)
TOTAL PROTEIN: 10.1 g/dL — AB (ref 6.5–8.1)

## 2017-09-11 LAB — LIPASE, BLOOD: Lipase: 16 U/L (ref 11–51)

## 2017-09-11 MED ORDER — MORPHINE SULFATE (PF) 4 MG/ML IV SOLN
4.0000 mg | Freq: Once | INTRAVENOUS | Status: AC
  Administered 2017-09-11: 4 mg via INTRAVENOUS
  Filled 2017-09-11: qty 1

## 2017-09-11 MED ORDER — SODIUM CHLORIDE 0.9 % IV BOLUS (SEPSIS): 1000.0000 mL | Freq: Once | INTRAVENOUS | Status: DC

## 2017-09-11 MED ORDER — ONDANSETRON 8 MG PO TBDP: 8.0000 mg | ORAL_TABLET | Freq: Three times a day (TID) | ORAL | 0 refills | Status: DC | PRN

## 2017-09-11 MED ORDER — SODIUM CHLORIDE 0.9 % IV BOLUS (SEPSIS)
2000.0000 mL | Freq: Once | INTRAVENOUS | Status: AC
Start: 2017-09-11 — End: 2017-09-11
  Administered 2017-09-11: 2000 mL via INTRAVENOUS

## 2017-09-11 MED ORDER — ONDANSETRON HCL 4 MG/2ML IJ SOLN
4.0000 mg | Freq: Once | INTRAMUSCULAR | Status: AC
  Administered 2017-09-11: 4 mg via INTRAVENOUS
  Filled 2017-09-11: qty 2

## 2017-09-11 NOTE — ED Notes (Signed)
Pt reminded of need for urine specimen 

## 2017-09-11 NOTE — ED Provider Notes (Signed)
[facility] DEPT Provider Note   CSN: [id number] Arrival date & time: 09/11/17  0801     History   Chief Complaint Chief Complaint  Patient presents with  . Abdominal Pain  . Emesis    HPI Lee Greer is a 52 y.o. male.  Pt is also having abdominal cramping.   It is mostly in the bottom.  He has not been able to keep anything down, not even ice chips.  He feels dehydrated and now has been dry heaving.   The history is provided by the patient.  Emesis   This is a new problem. The current episode started 2 days ago. The problem occurs 2 to 4 times per day. The problem has not changed since onset.The emesis has an appearance of stomach contents. There has been no fever. Associated symptoms include abdominal pain. Pertinent negatives include no cough, no diarrhea, no fever and no URI. Risk factors: no travel, no abx, no known ill contacts.    Past Medical History:  Diagnosis Date  . Acute kidney injury ([place]) 04/13/2017   related to "dehydration" (04/14/2017)  . Gastroparesis     Patient Active Problem List   Diagnosis Date Noted  . AKI (acute kidney injury) ([place]) 04/13/2017  . Dehydration 04/13/2017  . Essential hypertension 02/17/2015  . Nausea with vomiting 02/17/2015    History reviewed. No pertinent surgical history.     Home Medications    Prior to Admission medications   Medication Sig Start Date End Date Taking? Authorizing Provider  acetaminophen (TYLENOL) 500 MG tablet Take 1,000 mg by mouth daily as needed.   Yes [provider]  Multiple Vitamins-Minerals (MENS 50+ ADVANCED PO) Take 1 tablet by mouth daily.   Yes [provider]  acetaminophen (TYLENOL) 325 MG tablet Take 2 tablets (650 mg total) by mouth every 6 (six) hours as needed for mild pain or moderate pain (or Fever >/= 101). Patient not taking: Reported on 07/06/2017 04/14/17   Modena Jansky, MD  dicyclomine (BENTYL) 20 MG tablet Take 1  tablet (20 mg total) by mouth 2 (two) times daily as needed for spasms (nausea and vomitnig). Patient not taking: Reported on 09/11/2017 07/06/17   Gareth Morgan, MD  ondansetron (ZOFRAN ODT) 8 MG disintegrating tablet Take 1 tablet (8 mg total) by mouth every 8 (eight) hours as needed for nausea or vomiting. 09/11/17   Dorie Rank, MD  promethazine (PHENERGAN) 25 MG tablet Take 1 tablet (25 mg total) by mouth every 6 (six) hours as needed for nausea or vomiting. Patient not taking: Reported on 07/06/2017 04/12/17   Tanna Furry, MD    Family History Family History  Problem Relation Age of Onset  . CAD Mother   . Diabetes Neg Hx   . Stroke Neg Hx     Social History Social History   Tobacco Use  . Smoking status: Never Smoker  . Smokeless tobacco: Never Used  Substance Use Topics  . 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 Drug use: Yes    Types: Marijuana    Comment: 04/14/2017 "daily"     Allergies   Patient has no known allergies.   Review of Systems Review of Systems  Constitutional: Negative for fever.  Respiratory: Negative for cough.   Gastrointestinal: Positive for abdominal pain and vomiting. Negative for diarrhea.  All other systems reviewed and are negative.    Physical Exam Updated Vital Signs BP (!) 166/98 (  BP Location: Left Arm)   Pulse 82   Temp (!) 97.5 F (36.4 C) (Oral)   Resp 15   SpO2 100%   Physical Exam  Constitutional: He appears well-developed and well-nourished. No distress.  HENT:  Head: Normocephalic and atraumatic.  Right Ear: External ear normal.  Left Ear: External ear normal.  Eyes: Conjunctivae are normal. Right eye exhibits no discharge. Left eye exhibits no discharge. No scleral icterus.  Neck: Neck supple. No tracheal deviation present.  Cardiovascular: Normal rate, regular rhythm and intact distal pulses.  Pulmonary/Chest: Effort normal and breath sounds normal. No stridor. No respiratory distress. He has no  wheezes. He has no rales.  Abdominal: Soft. Bowel sounds are normal. He exhibits no distension. There is no tenderness. There is no rebound and no guarding.  Musculoskeletal: He exhibits no edema or tenderness.  Neurological: He is alert. He has normal strength. No cranial nerve deficit (no facial droop, extraocular movements intact, no slurred speech) or sensory deficit. He exhibits normal muscle tone. He displays no seizure activity. Coordination normal.  Skin: Skin is warm and dry. No rash noted.  Psychiatric: He has a normal mood and affect.  Nursing note and vitals reviewed.    ED Treatments / Results  Labs (all labs ordered are listed, but only abnormal results are displayed) Labs Reviewed  COMPREHENSIVE METABOLIC PANEL - Abnormal; Notable for the following components:      Result Value   CO2 20 (*)    Glucose, Bld 148 (*)    BUN 32 (*)    Creatinine, Ser 1.39 (*)    Total Protein 10.1 (*)    Albumin 5.5 (*)    GFR calc non Af Amer 58 (*)    All other components within normal limits  URINALYSIS, ROUTINE W REFLEX MICROSCOPIC - Abnormal; Notable for the following components:   APPearance HAZY (*)    Hgb urine dipstick MODERATE (*)    Ketones, ur 5 (*)    Protein, ur 100 (*)    Squamous Epithelial / LPF 0-5 (*)    All other components within normal limits  CBC WITH DIFFERENTIAL/PLATELET  LIPASE, BLOOD     Radiology No results found.  Procedures Procedures (including critical care time)  Medications Ordered in ED Medications  morphine 4 MG/ML injection 4 mg (4 mg Intravenous Given 09/11/17 1228)  ondansetron (ZOFRAN) injection 4 mg (4 mg Intravenous Given 09/11/17 1228)  sodium chloride 0.9 % bolus 2,000 mL (0 mLs Intravenous Stopped 09/11/17 1539)     Initial Impression / Assessment and Plan / ED Course  I have reviewed the triage vital signs and the nursing notes.  Pertinent labs & imaging results that were available during my care of the patient were reviewed by me  and considered in my medical decision making (see chart for details).  Clinical Course as of Sep 12 1611  Nancy Fetter Sep 11, 2017  1529 Labs consistent with dehydration.  Otherwise reassuring  [JK]  4315 Pt is feeling better.  Wants to try and drink something  [JK]    Clinical Course User Index [JK] Dorie Rank, MD     presented to the emergency room evaluation of nausea vomiting and feeling dehydrated.  No signs of hepatitis.  White blood cell count is not elevated.  He has no abdominal tenderness.  I doubt appendicitis, cholecystitis, bowel obstruction or other acute surgical condition.  Suspect his symptoms may be related to a viral illness.  Patient was given IV fluids and  antiemetics.  He is feeling much better and tolerated oral fluids.  Plan on discharge home with a prescription for Zofran.  Warning signs and precautions discussed. Final Clinical Impressions(s) / ED Diagnoses   Final diagnoses:  Non-intractable vomiting with nausea, unspecified vomiting type  Dehydration  Hypertension, unspecified type    ED Discharge Orders        Ordered    ondansetron (ZOFRAN ODT) 8 MG disintegrating tablet  Every 8 hours PRN     09/11/17 1610       Dorie Rank, MD 09/11/17 [phone]

## 2017-09-11 NOTE — Discharge Instructions (Signed)
Take medications as needed for nausea, follow-up with a primary care doctor to make sure you are improving and also follow-up on your elevated blood pressure

## 2017-09-11 NOTE — ED Triage Notes (Signed)
Pt c/o abdominal soreness r/t vomiting x 2 days. Denies diarrhea, denies fever.

## 2017-09-11 NOTE — ED Notes (Signed)
Bed: WA19 Expected date:  Expected time:  Means of arrival:  Comments: 

## 2017-09-11 NOTE — ED Notes (Signed)
Pt aware urine sample is needed 

## 2017-09-16 ENCOUNTER — Emergency Department (HOSPITAL_COMMUNITY): Payer: Self-pay

## 2017-09-16 ENCOUNTER — Encounter (HOSPITAL_COMMUNITY): Payer: Self-pay | Admitting: Emergency Medicine

## 2017-09-16 ENCOUNTER — Other Ambulatory Visit: Payer: Self-pay

## 2017-09-16 ENCOUNTER — Observation Stay (HOSPITAL_COMMUNITY)
Admission: EM | Admit: 2017-09-16 | Discharge: 2017-09-17 | Disposition: A | Discharge status: Home / Self Care | Payer: Self-pay | Attending: Family Medicine | Admitting: Family Medicine

## 2017-09-16 DIAGNOSIS — N179 Acute kidney failure, unspecified: Principal | ICD-10-CM | POA: Insufficient documentation

## 2017-09-16 DIAGNOSIS — R112 Nausea with vomiting, unspecified: Secondary | ICD-10-CM | POA: Insufficient documentation

## 2017-09-16 DIAGNOSIS — Z79899 Other long term (current) drug therapy: Secondary | ICD-10-CM | POA: Insufficient documentation

## 2017-09-16 DIAGNOSIS — R1110 Vomiting, unspecified: Secondary | ICD-10-CM | POA: Insufficient documentation

## 2017-09-16 DIAGNOSIS — E860 Dehydration: Secondary | ICD-10-CM | POA: Insufficient documentation

## 2017-09-16 DIAGNOSIS — I10 Essential (primary) hypertension: Secondary | ICD-10-CM

## 2017-09-16 LAB — URINALYSIS, ROUTINE W REFLEX MICROSCOPIC
Bilirubin Urine: NEGATIVE
Glucose, UA: NEGATIVE mg/dL
Ketones, ur: NEGATIVE mg/dL
Leukocytes, UA: NEGATIVE
Nitrite: NEGATIVE
Protein, ur: NEGATIVE mg/dL
Specific Gravity, Urine: 1.017 (ref 1.005–1.030)
pH: 5 (ref 5.0–8.0)

## 2017-09-16 LAB — COMPREHENSIVE METABOLIC PANEL
ALK PHOS: 71 U/L (ref 38–126)
ALT: 16 U/L — AB (ref 17–63)
AST: 21 U/L (ref 15–41)
Albumin: 5.2 g/dL — ABNORMAL HIGH (ref 3.5–5.0)
Anion gap: 17 — ABNORMAL HIGH (ref 5–15)
BUN: 107 mg/dL — ABNORMAL HIGH (ref 6–20)
CHLORIDE: 93 mmol/L — AB (ref 101–111)
CO2: 20 mmol/L — AB (ref 22–32)
Calcium: 9.1 mg/dL (ref 8.9–10.3)
Creatinine, Ser: 3.32 mg/dL — ABNORMAL HIGH (ref 0.61–1.24)
GFR, EST AFRICAN AMERICAN: 23 mL/min — AB (ref >60)
GFR, EST NON AFRICAN AMERICAN: 20 mL/min — AB (ref >60)
GLUCOSE: 129 mg/dL — AB (ref 65–99)
Potassium: 3.5 mmol/L (ref 3.5–5.1)
Sodium: 130 mmol/L — ABNORMAL LOW (ref 135–145)
Total Bilirubin: 1.8 mg/dL — ABNORMAL HIGH (ref 0.3–1.2)
Total Protein: 9.7 g/dL — ABNORMAL HIGH (ref 6.5–8.1)

## 2017-09-16 LAB — RAPID URINE DRUG SCREEN, HOSP PERFORMED
Amphetamines: NOT DETECTED
Barbiturates: NOT DETECTED
Benzodiazepines: NOT DETECTED
Cocaine: NOT DETECTED
OPIATES: NOT DETECTED
Tetrahydrocannabinol: POSITIVE — AB

## 2017-09-16 LAB — CBC
HCT: 45.2 % (ref 39.0–52.0)
Hemoglobin: 16.5 g/dL (ref 13.0–17.0)
MCH: 32.1 pg (ref 26.0–34.0)
MCHC: 36.5 g/dL — ABNORMAL HIGH (ref 30.0–36.0)
MCV: 87.9 fL (ref 78.0–100.0)
Platelets: 300 10*3/uL (ref 150–400)
RBC: 5.14 MIL/uL (ref 4.22–5.81)
RDW: 11.9 % (ref 11.5–15.5)
WBC: 9.6 10*3/uL (ref 4.0–10.5)

## 2017-09-16 LAB — LIPASE, BLOOD: Lipase: 21 U/L (ref 11–51)

## 2017-09-16 MED ORDER — METOCLOPRAMIDE HCL 5 MG/ML IJ SOLN
5.0000 mg | Freq: Three times a day (TID) | INTRAMUSCULAR | Status: DC
  Administered 2017-09-16 – 2017-09-17 (×3): 5 mg via INTRAVENOUS
  Filled 2017-09-16 (×3): qty 2

## 2017-09-16 MED ORDER — ONDANSETRON HCL 4 MG/2ML IJ SOLN: 4.0000 mg | Freq: Four times a day (QID) | INTRAMUSCULAR | Status: DC | PRN

## 2017-09-16 MED ORDER — ONDANSETRON HCL 4 MG PO TABS: 4.0000 mg | ORAL_TABLET | Freq: Four times a day (QID) | ORAL | Status: DC | PRN

## 2017-09-16 MED ORDER — ENSURE ENLIVE PO LIQD
237.0000 mL | Freq: Two times a day (BID) | ORAL | Status: DC
  Filled 2017-09-16 (×2): qty 237

## 2017-09-16 MED ORDER — METOCLOPRAMIDE HCL 5 MG/ML IJ SOLN
10.0000 mg | Freq: Once | INTRAMUSCULAR | Status: AC
  Administered 2017-09-16: 10 mg via INTRAVENOUS
  Filled 2017-09-16: qty 2

## 2017-09-16 MED ORDER — SODIUM CHLORIDE 0.9 % IV SOLN
INTRAVENOUS | Status: DC
  Administered 2017-09-16 – 2017-09-17 (×2): via INTRAVENOUS

## 2017-09-16 MED ORDER — SODIUM CHLORIDE 0.9 % IV BOLUS (SEPSIS)
1000.0000 mL | Freq: Once | INTRAVENOUS | Status: AC
  Administered 2017-09-16: 1000 mL via INTRAVENOUS

## 2017-09-16 NOTE — H&P (Signed)
History and Physical    Lee Greer EXB:[id number] DOB: 1965/09/10 DOA: 09/16/2017  PCP: Patient, No Pcp Per  Patient coming from:  home  Chief Complaint:  N/v for over 5 days  HPI: Lee Greer is a 52 y.o. male with medical history significant of gastroparesis, daily THC use comes in with over 5 days of nausea and vomiting not improved with oral zofran at home.  No abdominal pain.  No fevers.  He has had no vomiting here in the ED since arrival.  Pt found to be in AKI and dehydrated and referred for admission for ivf.  Review of Systems: As per HPI otherwise 10 point review of systems negative.   Past Medical History:  Diagnosis Date  . Acute kidney injury ([place]) 04/13/2017   related to "dehydration" (04/14/2017)  . Gastroparesis     History reviewed. No pertinent surgical history.   reports that  has never smoked. he has never used smokeless tobacco. He reports that he uses drugs. Drug: Marijuana. He reports that he does not drink alcohol.  No Known Allergies  Family History  Problem Relation Age of Onset  . CAD Mother   . Diabetes Neg Hx   . Stroke Neg Hx     Prior to Admission medications   Medication Sig Start Date End Date Taking? Authorizing Provider  acetaminophen (TYLENOL) 500 MG tablet Take 1,000 mg by mouth every 8 (eight) hours as needed for moderate pain or fever.    Yes [provider]  Multiple Vitamins-Minerals (MENS 50+ ADVANCED PO) Take 1 tablet by mouth daily.   Yes [provider]  ondansetron (ZOFRAN ODT) 8 MG disintegrating tablet Take 1 tablet (8 mg total) by mouth every 8 (eight) hours as needed for nausea or vomiting. 09/11/17  Yes Dorie Rank, MD  acetaminophen (TYLENOL) 325 MG tablet Take 2 tablets (650 mg total) by mouth every 6 (six) hours as needed for mild pain or moderate pain (or Fever >/= 101). Patient not taking: Reported on 07/06/2017 04/14/17   Modena Jansky, MD  dicyclomine (BENTYL) 20 MG tablet Take 1 tablet (20  mg total) by mouth 2 (two) times daily as needed for spasms (nausea and vomitnig). Patient not taking: Reported on 09/11/2017 07/06/17   Gareth Morgan, MD  promethazine (PHENERGAN) 25 MG tablet Take 1 tablet (25 mg total) by mouth every 6 (six) hours as needed for nausea or vomiting. Patient not taking: Reported on 07/06/2017 04/12/17   Tanna Furry, MD    Physical Exam: Vitals:   09/16/17 0915 09/16/17 1445  BP: (!) 116/91 (!) 153/113  Pulse: 100 94  Resp: 15 16  Temp: (!) 97.5 F (36.4 C)   TempSrc: Oral   SpO2: 100% 100%      Constitutional: NAD, calm, comfortable Vitals:   09/16/17 0915 09/16/17 1445  BP: (!) 116/91 (!) 153/113  Pulse: 100 94  Resp: 15 16  Temp: (!) 97.5 F (36.4 C)   TempSrc: Oral   SpO2: 100% 100%   Eyes: PERRL, lids and conjunctivae normal ENMT: Mucous membranes are moist. Posterior pharynx clear of any exudate or lesions.Normal dentition.  Neck: normal, supple, no masses, no thyromegaly Respiratory: clear to auscultation bilaterally, no wheezing, no crackles. Normal respiratory effort. No accessory muscle use.  Cardiovascular: Regular rate and rhythm, no murmurs / rubs / gallops. No extremity edema. 2+ pedal pulses. No carotid bruits.  Abdomen: no tenderness, no masses palpated. No hepatosplenomegaly. Bowel sounds positive.  Musculoskeletal: no clubbing /  cyanosis. No joint deformity upper and lower extremities. Good ROM, no contractures. Normal muscle tone.  Skin: no rashes, lesions, ulcers. No induration Neurologic: CN 2-12 grossly intact. Sensation intact, DTR normal. Strength 5/5 in all 4.  Psychiatric: Normal judgment and insight. Alert and oriented x 3. Normal mood.    Labs on Admission: I have personally reviewed following labs and imaging studies  CBC: Recent Labs  Lab 09/11/17 1226 09/16/17 1132  WBC 9.7 9.6  NEUTROABS 7.5  --   HGB 15.0 16.5  HCT 43.0 45.2  MCV 90.7 87.9  PLT 285 222   Basic Metabolic Panel: Recent Labs  Lab  09/11/17 1226 09/16/17 1132  NA 139 130*  K 4.4 3.5  CL 106 93*  CO2 20* 20*  GLUCOSE 148* 129*  BUN 32* 107*  CREATININE 1.39* 3.32*  CALCIUM 10.1 9.1   GFR: CrCl cannot be calculated (Unknown ideal weight.). Liver Function Tests: Recent Labs  Lab 09/11/17 1226 09/16/17 1132  AST 35 21  ALT 24 16*  ALKPHOS 77 71  BILITOT 0.4 1.8*  PROT 10.1* 9.7*  ALBUMIN 5.5* 5.2*   Recent Labs  Lab 09/11/17 1226 09/16/17 1132  LIPASE 16 21   No results for input(s): AMMONIA in the last 168 hours. Coagulation Profile: No results for input(s): INR, PROTIME in the last 168 hours. Cardiac Enzymes: No results for input(s): CKTOTAL, CKMB, CKMBINDEX, TROPONINI in the last 168 hours. BNP (last 3 results) No results for input(s): PROBNP in the last 8760 hours. HbA1C: No results for input(s): HGBA1C in the last 72 hours. CBG: No results for input(s): GLUCAP in the last 168 hours. Lipid Profile: No results for input(s): CHOL, HDL, LDLCALC, TRIG, CHOLHDL, LDLDIRECT in the last 72 hours. Thyroid Function Tests: No results for input(s): TSH, T4TOTAL, FREET4, T3FREE, THYROIDAB in the last 72 hours. Anemia Panel: No results for input(s): VITAMINB12, FOLATE, FERRITIN, TIBC, IRON, RETICCTPCT in the last 72 hours. Urine analysis:    Component Value Date/Time   COLORURINE YELLOW 09/11/2017 1530   APPEARANCEUR HAZY (A) 09/11/2017 1530   LABSPEC 1.027 09/11/2017 1530   PHURINE 5.0 09/11/2017 1530   GLUCOSEU NEGATIVE 09/11/2017 1530   HGBUR MODERATE (A) 09/11/2017 1530   BILIRUBINUR NEGATIVE 09/11/2017 1530   BILIRUBINUR small 02/17/2015 1222   KETONESUR 5 (A) 09/11/2017 1530   PROTEINUR 100 (A) 09/11/2017 1530   UROBILINOGEN 0.2 02/17/2015 1222   UROBILINOGEN 0.2 08/30/2014 0735   NITRITE NEGATIVE 09/11/2017 1530   LEUKOCYTESUR NEGATIVE 09/11/2017 1530   Sepsis Labs: !!!!!!!!!!!!!!!!!!!!!!!!!!!!!!!!!!!!!!!!!!!! @LABRCNTIP (procalcitonin:4,lacticidven:4) )No results found for this or  any previous visit (from the past 240 hour(s)).   Radiological Exams on Admission: Dg Abd Acute W/chest  Result Date: 09/16/2017 CLINICAL DATA:  Abdominal pain and vomiting for 1 week. EXAM: DG ABDOMEN ACUTE W/ 1V CHEST COMPARISON:  04/13/2017 and prior radiographs FINDINGS: The cardiomediastinal silhouette is unremarkable. Hyperinflation noted. The lungs are clear. No pleural effusion or pneumothorax. The bowel gas pattern is unremarkable. There is no evidence of bowel obstruction or pneumoperitoneum. No suspicious calcifications are noted. No acute bony abnormalities are identified. IMPRESSION: Negative abdominal radiographs.  No acute cardiopulmonary disease. Electronically Signed   By: Margarette Canada M.D.   On: 09/16/2017 15:45     Assessment/Plan 52 yo male with 5 days of n/v  Principal Problem:   AKI (acute kidney injury) ([place])- ivf.  Cr up over 3.  Repeat bmp in am.  Prerenal.  ua pending Ck uds  Active Problems:   Essential  hypertension- not on meds   Dehydration- ivf   Nausea & vomiting- lfts and lipase normal.  AAS normal.  abd exam benign.  Supportive care.  Seems to have resolved since arrival to ed     DVT prophylaxis:  scds Code Status:  full Family Communication:  none Disposition Plan:  Per day team Consults called:  none Admission status:   observation   Lane Kjos A MD [facility]  If 7PM-7AM, please contact night-coverage [email or web address] Password [facility]  09/16/2017, 4:24 PM

## 2017-09-16 NOTE — Progress Notes (Signed)
Alert and oriented when brought to room from ED around [place].  Reports no vomiting since this morning at 0900.  Denies nausea and pain at this time.

## 2017-09-16 NOTE — ED Triage Notes (Signed)
Patient reports he was here on Sunday for upset stomach. Reports that he is taking Zofran ODT but still vomiting and very weak.

## 2017-09-16 NOTE — ED Notes (Addendum)
Pt given reglan 5mg  dose before scheduled time, Hospitalist aware. SZP filed and pt aware. VS WDL.

## 2017-09-16 NOTE — Progress Notes (Signed)
Called and received report from Delorise  in ED

## 2017-09-16 NOTE — ED Provider Notes (Signed)
[facility] DEPT Provider Note  CSN: [id number] Arrival date & time: 09/16/17 1740  Chief Complaint(s) Fatigue and Emesis  HPI Lee Greer is a 52 y.o. male  The history is provided by the patient.  Emesis   This is a recurrent problem. Episode onset: 1 week. The problem occurs 5 to 10 times per day. The problem has not changed since onset.The emesis has an appearance of stomach contents. There has been no fever. Associated symptoms include abdominal pain (abd wall pain). Pertinent negatives include no chills, no diarrhea, no fever and no myalgias.   Endorses continued marijuana use, but significantly decreased.  Past Medical History Past Medical History:  Diagnosis Date  . Acute kidney injury ([place]) 04/13/2017   related to "dehydration" (04/14/2017)  . Gastroparesis    Patient Active Problem List   Diagnosis Date Noted  . AKI (acute kidney injury) ([place]) 04/13/2017  . Dehydration 04/13/2017  . Essential hypertension 02/17/2015  . Nausea with vomiting 02/17/2015   Home Medication(s) Prior to Admission medications   Medication Sig Start Date End Date Taking? Authorizing Provider  acetaminophen (TYLENOL) 325 MG tablet Take 2 tablets (650 mg total) by mouth every 6 (six) hours as needed for mild pain or moderate pain (or Fever >/= 101). Patient not taking: Reported on 07/06/2017 04/14/17   Modena Jansky, MD  acetaminophen (TYLENOL) 500 MG tablet Take 1,000 mg by mouth daily as needed.    [provider]  dicyclomine (BENTYL) 20 MG tablet Take 1 tablet (20 mg total) by mouth 2 (two) times daily as needed for spasms (nausea and vomitnig). Patient not taking: Reported on 09/11/2017 07/06/17   Gareth Morgan, MD  Multiple Vitamins-Minerals (MENS 50+ ADVANCED PO) Take 1 tablet by mouth daily.    [provider]  ondansetron (ZOFRAN ODT) 8 MG disintegrating tablet Take 1 tablet (8 mg total) by mouth every 8 (eight) hours as needed for  nausea or vomiting. 09/11/17   Dorie Rank, MD  promethazine (PHENERGAN) 25 MG tablet Take 1 tablet (25 mg total) by mouth every 6 (six) hours as needed for nausea or vomiting. Patient not taking: Reported on 07/06/2017 04/12/17   Tanna Furry, MD                                                                                                                                    Past Surgical History History reviewed. No pertinent surgical history. Family History Family History  Problem Relation Age of Onset  . CAD Mother   . Diabetes Neg Hx   . Stroke Neg Hx     Social History Social History   Tobacco Use  . Smoking status: Never Smoker  . Smokeless tobacco: Never Used  Substance Use Topics  . 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 Drug use: Yes    Types: Marijuana    Comment: 04/14/2017 "  daily"   Allergies Patient has no known allergies.  Review of Systems Review of Systems  Constitutional: Negative for chills and fever.  Gastrointestinal: Positive for abdominal pain (abd wall pain) and vomiting. Negative for diarrhea.  Musculoskeletal: Negative for myalgias.   All other systems are reviewed and are negative for acute change except as noted in the HPI  Physical Exam Vital Signs  I have reviewed the triage vital signs BP (!) 116/91 (BP Location: Left Arm)   Pulse 100   Temp (!) 97.5 F (36.4 C) (Oral)   Resp 15   SpO2 100%   Physical Exam  Constitutional: He is oriented to person, place, and time. He appears well-developed and well-nourished. No distress.  thin  HENT:  Head: Normocephalic and atraumatic.  Nose: Nose normal.  Eyes: Conjunctivae and EOM are normal. Pupils are equal, round, and reactive to light. Right eye exhibits no discharge. Left eye exhibits no discharge. No scleral icterus.  Neck: Normal range of motion. Neck supple.  Cardiovascular: Normal rate and regular rhythm. Exam reveals no gallop and no friction rub.  No murmur  heard. Pulmonary/Chest: Effort normal and breath sounds normal. No stridor. No respiratory distress. He has no rales.  Abdominal: Soft. He exhibits no distension. There is tenderness ( Point tenderness to abd wall ). There is no rebound, no guarding and negative Murphy's sign.    Musculoskeletal: He exhibits no edema or tenderness.  Neurological: He is alert and oriented to person, place, and time.  Skin: Skin is warm and dry. No rash noted. He is not diaphoretic. No erythema.  Psychiatric: He has a normal mood and affect.  Vitals reviewed.   ED Results and Treatments Labs (all labs ordered are listed, but only abnormal results are displayed) Labs Reviewed  COMPREHENSIVE METABOLIC PANEL - Abnormal; Notable for the following components:      Result Value   Sodium 130 (*)    Chloride 93 (*)    CO2 20 (*)    Glucose, Bld 129 (*)    BUN 107 (*)    Creatinine, Ser 3.32 (*)    Total Protein 9.7 (*)    Albumin 5.2 (*)    ALT 16 (*)    Total Bilirubin 1.8 (*)    GFR calc non Af Amer 20 (*)    GFR calc Af Amer 23 (*)    Anion gap 17 (*)    All other components within normal limits  CBC - Abnormal; Notable for the following components:   MCHC 36.5 (*)    All other components within normal limits  LIPASE, BLOOD  URINALYSIS, ROUTINE W REFLEX MICROSCOPIC                                                                                                                         EKG  EKG Interpretation  Date/Time:    Ventricular Rate:    PR Interval:    QRS Duration:   QT Interval:  QTC Calculation:   R Axis:     Text Interpretation:        Radiology No results found. Pertinent labs & imaging results that were available during my care of the patient were reviewed by me and considered in my medical decision making (see chart for details).  Medications Ordered in ED Medications  sodium chloride 0.9 % bolus 1,000 mL (not administered)  metoCLOPramide (REGLAN) injection 10 mg  (not administered)                                                                                                                                    Procedures Procedures  (including critical care time)  Medical Decision Making / ED Course I have reviewed the nursing notes for this encounter and the patient's prior records (if available in EHR or on provided paperwork).    AKI due to dehydration from cyclical vomiting. abd w/o evidence of peritonitis. Doubt serious intraabdominal inflammatory/infectious process. No need for imagine at this time. Will provide with antiemetics and IVF.   Will discuss with hospitalist regarding admission for further hydration.  Final Clinical Impression(s) / ED Diagnoses Final diagnoses:  AKI (acute kidney injury) ([place])  Dehydration      This chart was dictated using voice recognition software.  Despite best efforts to proofread,  errors can occur which can change the documentation meaning.   Fatima Blank, MD 09/16/17 1440

## 2017-09-16 NOTE — ED Notes (Signed)
Patient transported to X-ray 

## 2017-09-16 NOTE — ED Notes (Signed)
ED TO INPATIENT HANDOFF REPORT  Name/Age/Gender Lee Greer 52 y.o. male  Code Status Code Status History    Date Active Date Inactive Code Status Order ID Comments User Context   04/14/2017 03:19 04/14/2017 21:25 Full Code [id number]  Toy Baker, MD ED      Home/SNF/Other Home  Chief Complaint stomach pain  Level of Care/Admitting Diagnosis ED Disposition    ED Disposition Condition [facility] Area: [facility] [[id number]]  Level of Care: Med-Surg [16]  Diagnosis: Nausea & vomiting [762263]  Admitting Physician: Phillips Grout [[id number]]  Attending Physician: Derrill Kay A [[id number]]  PT Class (Do Not Modify): Observation [104]  PT Acc Code (Do Not Modify): Observation [10022]       Medical History Past Medical History:  Diagnosis Date  . Acute kidney injury ([place]) 04/13/2017   related to "dehydration" (04/14/2017)  . Gastroparesis     Allergies No Known Allergies  IV Location/Drains/Wounds Patient Lines/Drains/Airways Status   Active Line/Drains/Airways    Name:   Placement date:   Placement time:   Site:   Days:   Peripheral IV 09/16/17 Right Antecubital   09/16/17    1521    Antecubital   less than 1          Labs/Imaging Results for orders placed or performed during the hospital encounter of 09/16/17 (from the past 48 hour(s))  Lipase, blood     Status: None   Collection Time: 09/16/17 11:32 AM  Result Value Ref Range   Lipase 21 11 - 51 U/L  Comprehensive metabolic panel     Status: Abnormal   Collection Time: 09/16/17 11:32 AM  Result Value Ref Range   Sodium 130 (L) 135 - 145 mmol/L   Potassium 3.5 3.5 - 5.1 mmol/L   Chloride 93 (L) 101 - 111 mmol/L   CO2 20 (L) 22 - 32 mmol/L   Glucose, Bld 129 (H) 65 - 99 mg/dL   BUN 107 (H) 6 - 20 mg/dL    Comment: RESULTS CONFIRMED BY MANUAL DILUTION   Creatinine, Ser 3.32 (H) 0.61 - 1.24 mg/dL   Calcium 9.1 8.9 - 10.3 mg/dL   Total Protein 9.7 (H) 6.5 - 8.1 g/dL   Albumin 5.2 (H) 3.5 - 5.0 g/dL   AST 21 15 - 41 U/L   ALT 16 (L) 17 - 63 U/L   Alkaline Phosphatase 71 38 - 126 U/L   Total Bilirubin 1.8 (H) 0.3 - 1.2 mg/dL   GFR calc non Af Amer 20 (L) >60 mL/min   GFR calc Af Amer 23 (L) >60 mL/min    Comment: (NOTE) The eGFR has been calculated using the CKD EPI equation. This calculation has not been validated in all clinical situations. eGFR's persistently <60 mL/min signify possible Chronic Kidney Disease.    Anion gap 17 (H) 5 - 15  CBC     Status: Abnormal   Collection Time: 09/16/17 11:32 AM  Result Value Ref Range   WBC 9.6 4.0 - 10.5 K/uL   RBC 5.14 4.22 - 5.81 MIL/uL   Hemoglobin 16.5 13.0 - 17.0 g/dL   HCT 45.2 39.0 - 52.0 %   MCV 87.9 78.0 - 100.0 fL   MCH 32.1 26.0 - 34.0 pg   MCHC 36.5 (H) 30.0 - 36.0 g/dL   RDW 11.9 11.5 - 15.5 %   Platelets 300 150 - 400 K/uL   Dg Abd Acute W/chest  Result Date: 09/16/2017 CLINICAL DATA:  Abdominal pain and vomiting for 1 week. EXAM: DG ABDOMEN ACUTE W/ 1V CHEST COMPARISON:  04/13/2017 and prior radiographs FINDINGS: The cardiomediastinal silhouette is unremarkable. Hyperinflation noted. The lungs are clear. No pleural effusion or pneumothorax. The bowel gas pattern is unremarkable. There is no evidence of bowel obstruction or pneumoperitoneum. No suspicious calcifications are noted. No acute bony abnormalities are identified. IMPRESSION: Negative abdominal radiographs.  No acute cardiopulmonary disease. Electronically Signed   By: Margarette Canada M.D.   On: 09/16/2017 15:45    Pending Labs Unresulted Labs (From admission, onward)   Start     Ordered   09/16/17 0917  Urinalysis, Routine w reflex microscopic  STAT,   STAT     09/16/17 0916   Signed and Held  Basic metabolic panel  Tomorrow morning,   R     Signed and Held   Signed and Held  Urine rapid drug screen (hosp performed)  STAT,   R     Signed and Held      Vitals/Pain Today's Vitals   09/16/17 0915 09/16/17 1445 09/16/17 1708  09/16/17 1744  BP: (!) 116/91 (!) 153/113 (!) 140/105 (!) 145/96  Pulse: 100 94 98 91  Resp: _0 Temp: (!) 97.5 F (36.4 C)  98.5 F (36.9 C)   TempSrc: Oral  Oral   SpO2: 100% 100% 100% 96%  PainSc: 0-No pain       Isolation Precautions No active isolations  Medications Medications  metoCLOPramide (REGLAN) injection 5 mg (5 mg Intravenous Given 09/16/17 1709)  feeding supplement (ENSURE ENLIVE) (ENSURE ENLIVE) liquid 237 mL (not administered)  sodium chloride 0.9 % bolus 1,000 mL (0 mLs Intravenous Stopped 09/16/17 1711)  metoCLOPramide (REGLAN) injection 10 mg (10 mg Intravenous Given 09/16/17 1519)    Mobility walks

## 2017-09-16 NOTE — ED Notes (Signed)
Pt states becoming nauseated after water, but was able to keep water down.

## 2017-09-16 NOTE — ED Notes (Signed)
Pt informed urine sample is needed, urinal given

## 2017-09-17 DIAGNOSIS — N179 Acute kidney failure, unspecified: Principal | ICD-10-CM

## 2017-09-17 DIAGNOSIS — R112 Nausea with vomiting, unspecified: Secondary | ICD-10-CM

## 2017-09-17 DIAGNOSIS — E860 Dehydration: Secondary | ICD-10-CM

## 2017-09-17 LAB — BASIC METABOLIC PANEL
Anion gap: 9 (ref 5–15)
BUN: 61 mg/dL — AB (ref 6–20)
CALCIUM: 8.1 mg/dL — AB (ref 8.9–10.3)
CHLORIDE: 102 mmol/L (ref 101–111)
CO2: 21 mmol/L — ABNORMAL LOW (ref 22–32)
CREATININE: 1.24 mg/dL (ref 0.61–1.24)
GFR calc non Af Amer: >60 mL/min (ref >60)
Glucose, Bld: 102 mg/dL — ABNORMAL HIGH (ref 65–99)
Potassium: 3.2 mmol/L — ABNORMAL LOW (ref 3.5–5.1)
SODIUM: 132 mmol/L — AB (ref 135–145)

## 2017-09-17 MED ORDER — ONDANSETRON 8 MG PO TBDP: 8.0000 mg | ORAL_TABLET | Freq: Three times a day (TID) | ORAL | 0 refills | Status: DC | PRN

## 2017-09-17 MED ORDER — POTASSIUM CHLORIDE CRYS ER 20 MEQ PO TBCR
20.0000 meq | EXTENDED_RELEASE_TABLET | Freq: Once | ORAL | Status: AC
  Administered 2017-09-17: 20 meq via ORAL
  Filled 2017-09-17: qty 1

## 2017-09-17 NOTE — Discharge Instructions (Signed)

## 2017-09-17 NOTE — Discharge Summary (Signed)
Physician Discharge Summary  Patient ID: Lee Greer MRN: [id number] DOB/AGE: [birth year] 51 y.o.  Admit date: 09/16/2017 Discharge date: 09/17/2017  Admission Diagnoses:  Discharge Diagnoses:  Principal Problem:   AKI (acute kidney injury) ([place]) Active Problems:   Essential hypertension   Dehydration   Nausea & vomiting   Discharged Condition: stable  Hospital Course: Patient with history of gastroparesis, admitted with vomiting and elevated creatinine. Patient was aggressively hydrated overnight with improvement of his symptoms. Stable for discharge to home.  Consults: None  Significant Diagnostic Studies: labs: Cr 3.3 on Admission, improved to 1.24 following hydration  Treatments: IV hydration  Discharge Exam: Blood pressure 129/75, pulse 91, temperature 99 F (37.2 C), temperature source Oral, resp. rate 18, height 6' (1.829 m), weight 60.8 kg (134 lb 0.6 oz), SpO2 99 %. General appearance: alert, cooperative and no distress Resp: clear to auscultation bilaterally Cardio: regular rate and rhythm, S1, S2 normal, no murmur, click, rub or gallop GI: soft, non-tender; bowel sounds normal; no masses,  no organomegaly Extremities: extremities normal, atraumatic, no cyanosis or edema and Homans sign is negative, no sign of DVT Pulses: 2+ and symmetric Skin: Skin color, texture, turgor normal. No rashes or lesions  Disposition: 01-Home or Self Care  Discharge Instructions    Call MD for:  extreme fatigue   Complete by:  As directed    Call MD for:  persistant nausea and vomiting   Complete by:  As directed    Call MD for:  severe uncontrolled pain   Complete by:  As directed    Call MD for:  temperature >100.4   Complete by:  As directed    Diet - low sodium heart healthy   Complete by:  As directed    Increase activity slowly   Complete by:  As directed      Allergies as of 09/17/2017   No Known Allergies     Medication List    STOP taking these  medications   dicyclomine 20 MG tablet Commonly known as:  BENTYL   promethazine 25 MG tablet Commonly known as:  PHENERGAN     TAKE these medications   acetaminophen 500 MG tablet Commonly known as:  TYLENOL Take 1,000 mg by mouth every 8 (eight) hours as needed for moderate pain or fever. What changed:  Another medication with the same name was removed. Continue taking this medication, and follow the directions you see here.   MENS 50+ ADVANCED PO Take 1 tablet by mouth daily.   ondansetron 8 MG disintegrating tablet Commonly known as:  ZOFRAN ODT Take 1 tablet (8 mg total) by mouth every 8 (eight) hours as needed for nausea or vomiting.      Follow-up Information    [facility] Follow up.   Why:  This can become your doctors office without insurance. Contact information: [place] [address] [facility].   Specialty:  Internal Medicine Why:  This can become your doctors office without insurance. Contact information: [place] [facility].   Why:  Once you make an appointment at either of the other doctors offices, you can use this location for your PHARMACY and FINANCIAL COUNSELING needs. Contact information: [address] [place] [address] [phone]  Signed: Truett Mainland 09/17/2017, 12:44 PM

## 2017-10-01 ENCOUNTER — Encounter (HOSPITAL_COMMUNITY): Payer: Self-pay

## 2017-10-01 ENCOUNTER — Other Ambulatory Visit: Payer: Self-pay

## 2017-10-01 DIAGNOSIS — I10 Essential (primary) hypertension: Secondary | ICD-10-CM | POA: Insufficient documentation

## 2017-10-01 DIAGNOSIS — K2900 Acute gastritis without bleeding: Secondary | ICD-10-CM | POA: Insufficient documentation

## 2017-10-01 NOTE — ED Triage Notes (Signed)
States seen 3 times now for same complaint with nausea and abdominal pain unable to keep anything down.

## 2017-10-02 ENCOUNTER — Encounter (HOSPITAL_COMMUNITY): Payer: Self-pay | Admitting: Radiology

## 2017-10-02 ENCOUNTER — Emergency Department (HOSPITAL_COMMUNITY)
Admission: EM | Admit: 2017-10-02 | Discharge: 2017-10-02 | Disposition: A | Discharge status: Home / Self Care | Payer: Self-pay | Attending: Emergency Medicine | Admitting: Emergency Medicine

## 2017-10-02 ENCOUNTER — Emergency Department (HOSPITAL_COMMUNITY): Payer: Self-pay

## 2017-10-02 DIAGNOSIS — K2900 Acute gastritis without bleeding: Secondary | ICD-10-CM

## 2017-10-02 LAB — COMPREHENSIVE METABOLIC PANEL
ALBUMIN: 3.7 g/dL (ref 3.5–5.0)
ALK PHOS: 49 U/L (ref 38–126)
ALT: 13 U/L — AB (ref 17–63)
ANION GAP: 8 (ref 5–15)
AST: 15 U/L (ref 15–41)
BUN: 9 mg/dL (ref 6–20)
CO2: 24 mmol/L (ref 22–32)
Calcium: 8.6 mg/dL — ABNORMAL LOW (ref 8.9–10.3)
Chloride: 106 mmol/L (ref 101–111)
Creatinine, Ser: 0.64 mg/dL (ref 0.61–1.24)
GFR calc Af Amer: >60 mL/min (ref >60)
GFR calc non Af Amer: >60 mL/min (ref >60)
Glucose, Bld: 94 mg/dL (ref 65–99)
Potassium: 2.7 mmol/L — CL (ref 3.5–5.1)
SODIUM: 138 mmol/L (ref 135–145)
TOTAL PROTEIN: 6.5 g/dL (ref 6.5–8.1)
Total Bilirubin: 0.6 mg/dL (ref 0.3–1.2)

## 2017-10-02 LAB — CBC
HEMATOCRIT: 31.3 % — AB (ref 39.0–52.0)
Hemoglobin: 11 g/dL — ABNORMAL LOW (ref 13.0–17.0)
MCH: 31.3 pg (ref 26.0–34.0)
MCHC: 35.1 g/dL (ref 30.0–36.0)
MCV: 88.9 fL (ref 78.0–100.0)
PLATELETS: 230 10*3/uL (ref 150–400)
RBC: 3.52 MIL/uL — AB (ref 4.22–5.81)
RDW: 13.5 % (ref 11.5–15.5)
WBC: 7.3 10*3/uL (ref 4.0–10.5)

## 2017-10-02 LAB — LIPASE, BLOOD: LIPASE: 32 U/L (ref 11–51)

## 2017-10-02 MED ORDER — SUCRALFATE 1 G PO TABS: 1.0000 g | ORAL_TABLET | Freq: Three times a day (TID) | ORAL | 0 refills | Status: DC

## 2017-10-02 MED ORDER — ONDANSETRON HCL 4 MG/2ML IJ SOLN
4.0000 mg | Freq: Once | INTRAMUSCULAR | Status: AC
  Administered 2017-10-02: 4 mg via INTRAVENOUS
  Filled 2017-10-02: qty 2

## 2017-10-02 MED ORDER — SODIUM CHLORIDE 0.9 % IV BOLUS (SEPSIS)
1000.0000 mL | Freq: Once | INTRAVENOUS | Status: AC
  Administered 2017-10-02: 1000 mL via INTRAVENOUS

## 2017-10-02 MED ORDER — IOPAMIDOL (ISOVUE-300) INJECTION 61%
100.0000 mL | Freq: Once | INTRAVENOUS | Status: AC | PRN
  Administered 2017-10-02: 100 mL via INTRAVENOUS

## 2017-10-02 MED ORDER — MORPHINE SULFATE (PF) 4 MG/ML IV SOLN
4.0000 mg | Freq: Once | INTRAVENOUS | Status: AC
  Administered 2017-10-02: 4 mg via INTRAVENOUS
  Filled 2017-10-02: qty 1

## 2017-10-02 MED ORDER — CAPSAICIN 0.025 % EX CREA
TOPICAL_CREAM | Freq: Once | CUTANEOUS | Status: AC
  Administered 2017-10-02: 03:00:00 via TOPICAL
  Filled 2017-10-02: qty 60

## 2017-10-02 MED ORDER — PANTOPRAZOLE SODIUM 20 MG PO TBEC: 20.0000 mg | DELAYED_RELEASE_TABLET | Freq: Every day | ORAL | 0 refills | Status: DC

## 2017-10-02 MED ORDER — POTASSIUM CHLORIDE ER 10 MEQ PO TBCR: 20.0000 meq | EXTENDED_RELEASE_TABLET | Freq: Every day | ORAL | 0 refills | Status: DC

## 2017-10-02 MED ORDER — POTASSIUM CHLORIDE CRYS ER 20 MEQ PO TBCR
40.0000 meq | EXTENDED_RELEASE_TABLET | Freq: Once | ORAL | Status: AC
  Administered 2017-10-02: 40 meq via ORAL
  Filled 2017-10-02: qty 2

## 2017-10-02 MED ORDER — IOPAMIDOL (ISOVUE-300) INJECTION 61%
INTRAVENOUS | Status: AC
  Filled 2017-10-02: qty 100

## 2017-10-02 MED ORDER — PANTOPRAZOLE SODIUM 40 MG IV SOLR
40.0000 mg | Freq: Once | INTRAVENOUS | Status: AC
  Administered 2017-10-02: 40 mg via INTRAVENOUS
  Filled 2017-10-02: qty 40

## 2017-10-02 NOTE — ED Provider Notes (Signed)
I have received a call from Lee Greer, patient comes in with a coupon for Protonix however it only applies to 40 mg tabs, given verbal okay for the dosage to be changed to 40 mg from 20 mg, dispense same number of pills.   Monico Blitz, PA-C 10/02/17 1406    Orpah Greek, MD 10/03/17 [phone]

## 2017-10-02 NOTE — ED Provider Notes (Signed)
[facility] DEPT Provider Note   CSN: [id number] Arrival date & time: 10/01/17  2316     History   Chief Complaint Chief Complaint  Patient presents with  . Abdominal Pain    HPI Lee Greer is a 52 y.o. male.  Patient presents to the emergency department with a chief complaint of epigastric abdominal pain.  He reports associated nausea and vomiting.  He states that he has had the symptoms for the past 3 weeks.  He states that he has been unable to keep anything down.  He reports seeing a small amount of blood in his stool a few days ago, but it has since ceased.  He states that he used to use marijuana, but has not used in the past month.  He states that he has a follow-up appointment with a primary care doctor on Monday.  He has tried taking Zofran ODT at home with no relief of his nausea or vomiting.  He states that he mainly has the pain in his epigastrium while vomiting.  He denies any other associated symptoms.   The history is provided by the patient. No language interpreter was used.    Past Medical History:  Diagnosis Date  . Acute kidney injury ([place]) 04/13/2017   related to "dehydration" (04/14/2017)  . Gastroparesis     Patient Active Problem List   Diagnosis Date Noted  . Nausea & vomiting 09/16/2017  . AKI (acute kidney injury) ([place]) 04/13/2017  . Dehydration 04/13/2017  . Essential hypertension 02/17/2015  . Nausea with vomiting 02/17/2015    History reviewed. No pertinent surgical history.     Home Medications    Prior to Admission medications   Medication Sig Start Date End Date Taking? Authorizing Provider  acetaminophen (TYLENOL) 500 MG tablet Take 1,000 mg by mouth every 8 (eight) hours as needed for moderate pain or fever.    Yes [provider]  Multiple Vitamins-Minerals (MENS 50+ ADVANCED PO) Take 1 tablet by mouth daily.   Yes [provider]  ondansetron (ZOFRAN ODT) 8 MG disintegrating  tablet Take 1 tablet (8 mg total) by mouth every 8 (eight) hours as needed for nausea or vomiting. 09/17/17  Yes Truett Mainland, DO    Family History Family History  Problem Relation Age of Onset  . CAD Mother   . Diabetes Neg Hx   . Stroke Neg Hx     Social History Social History   Tobacco Use  . Smoking status: Never Smoker  . Smokeless tobacco: Never Used  Substance Use Topics  . 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 Drug use: Yes    Types: Marijuana    Comment: 04/14/2017 "daily"     Allergies   Patient has no known allergies.   Review of Systems Review of Systems  All other systems reviewed and are negative.    Physical Exam Updated Vital Signs BP (!) 135/99 (BP Location: Right Arm)   Pulse 87   Temp 99.9 F (37.7 C) (Oral)   Resp 20   Ht 6\' 1"  (1.854 m)   Wt 60.8 kg (134 lb)   SpO2 100%   BMI 17.68 kg/m   Physical Exam  Constitutional: He is oriented to person, place, and time. He appears well-developed and well-nourished.  HENT:  Head: Normocephalic and atraumatic.  Eyes: Conjunctivae and EOM are normal. Pupils are equal, round, and reactive to light. Right eye exhibits no discharge. Left  eye exhibits no discharge. No scleral icterus.  Neck: Normal range of motion. Neck supple. No JVD present.  Cardiovascular: Normal rate, regular rhythm and normal heart sounds. Exam reveals no gallop and no friction rub.  No murmur heard. Pulmonary/Chest: Effort normal and breath sounds normal. No respiratory distress. He has no wheezes. He has no rales. He exhibits no tenderness.  Abdominal: Soft. He exhibits no distension and no mass. There is no tenderness. There is no rebound and no guarding.  No focal abdominal tenderness, no RLQ tenderness or pain at McBurney's point, no RUQ tenderness or Murphy's sign, no left-sided abdominal tenderness, no fluid wave, or signs of peritonitis   Musculoskeletal: Normal range of motion. He exhibits no  edema or tenderness.  Neurological: He is alert and oriented to person, place, and time.  Skin: Skin is warm and dry.  Psychiatric: He has a normal mood and affect. His behavior is normal. Judgment and thought content normal.  Nursing note and vitals reviewed.    ED Treatments / Results  Labs (all labs ordered are listed, but only abnormal results are displayed) Labs Reviewed  COMPREHENSIVE METABOLIC PANEL - Abnormal; Notable for the following components:      Result Value   Potassium 2.7 (*)    Calcium 8.6 (*)    ALT 13 (*)    All other components within normal limits  CBC - Abnormal; Notable for the following components:   RBC 3.52 (*)    Hemoglobin 11.0 (*)    HCT 31.3 (*)    All other components within normal limits  LIPASE, BLOOD    EKG  EKG Interpretation None       Radiology Ct Abdomen Pelvis W Contrast  Result Date: 10/02/2017 CLINICAL DATA:  States seen 3 times now for same complaint with nausea and abdominal pain EXAM: CT ABDOMEN AND PELVIS WITH CONTRAST TECHNIQUE: Multidetector CT imaging of the abdomen and pelvis was performed using the standard protocol following bolus administration of intravenous contrast. CONTRAST:  114mL ISOVUE-300 IOPAMIDOL (ISOVUE-300) INJECTION 61% COMPARISON:  11/26/2009 FINDINGS: Lower chest: Lung bases are clear.  Old right rib fractures. Hepatobiliary: No focal liver abnormality is seen. No gallstones, gallbladder wall thickening, or biliary dilatation. Pancreas: Unremarkable. No pancreatic ductal dilatation or surrounding inflammatory changes. Spleen: Small spleen size could be associated with sickle cell. No focal lesions. Adrenals/Urinary Tract: Adrenal glands are unremarkable. Kidneys are normal, without renal calculi, focal lesion, or hydronephrosis. Bladder is unremarkable. Stomach/Bowel: Stomach is within normal limits. Appendix appears normal. No evidence of bowel wall thickening, distention, or inflammatory changes.  Vascular/Lymphatic: Aortic atherosclerosis. No enlarged abdominal or pelvic lymph nodes. Reproductive: Prostate is unremarkable. Other: No abdominal wall hernia or abnormality. No abdominopelvic ascites. Musculoskeletal: No acute or significant osseous findings. IMPRESSION: No acute process demonstrated in the abdomen or pelvis. Small spleen size can be associated with sickle cell. No evidence of bowel obstruction or inflammation. Electronically Signed   By: Lucienne Capers M.D.   On: 10/02/2017 04:50    Procedures Procedures (including critical care time)  Medications Ordered in ED Medications  pantoprazole (PROTONIX) injection 40 mg (not administered)  morphine 4 MG/ML injection 4 mg (not administered)  ondansetron (ZOFRAN) injection 4 mg (not administered)  sodium chloride 0.9 % bolus 1,000 mL (not administered)  capsaicin (ZOSTRIX) 0.025 % cream (not administered)     Initial Impression / Assessment and Plan / ED Course  I have reviewed the triage vital signs and the nursing notes.  Pertinent labs &  imaging results that were available during my care of the patient were reviewed by me and considered in my medical decision making (see chart for details).     Patient with epigastric pain with associated nausea and vomiting.  He has had the symptoms for the past 3 weeks.  He was admitted overnight about 10 days ago for rehydration.  Will check labs, give fluids, treat symptoms.  Given the ongoing nature of the symptoms, CT scan.  Do have a high degree of suspicion for gastritis versus peptic ulcer disease versus cannabis induced cyclical vomiting.  CT is unremarkable as above.  Patient feels significantly improved after 2 L of fluid.  He is tolerating food and drink.  Question peptic ulcer disease.  Will treat with Protonix and Carafate.  Recommend PCP follow-up.  Potassium is low at 2.7.  Repleted in the emergency department, and will also discharged home with some potassium as well.  He  has follow-up tomorrow.  Return precautions given.  He is stable and ready for discharge.  Final Clinical Impressions(s) / ED Diagnoses   Final diagnoses:  Acute gastritis without hemorrhage, unspecified gastritis type    ED Discharge Orders        Ordered    pantoprazole (PROTONIX) 20 MG tablet  Daily     10/02/17 0546    sucralfate (CARAFATE) 1 g tablet  3 times daily with meals & bedtime     10/02/17 0546    potassium chloride (K-DUR) 10 MEQ tablet  Daily     10/02/17 0546       Montine Circle, PA-C 10/02/17 0551    Orpah Greek, MD 10/02/17 [phone]

## 2017-10-02 NOTE — ED Notes (Signed)
Unable to collect labs I called patient name in the lobby and no one responded 

## 2017-10-02 NOTE — ED Notes (Signed)
AVS explained in detail. Knows to follow up with [facility] 10/03/2017 for follow up and primary care set up. A&Ox4. Ambulatory upon discharge. No nausea or pain noted upon discharge. Knows to take medications as prescribed and increase fluid intake. No other c/c. Coupons given with prescriptions.

## 2017-10-02 NOTE — ED Notes (Signed)
Patient transported to CT 

## 2017-10-03 ENCOUNTER — Encounter: Payer: Self-pay | Admitting: Nurse Practitioner

## 2017-10-03 ENCOUNTER — Ambulatory Visit: Payer: Self-pay | Attending: Nurse Practitioner | Admitting: Nurse Practitioner

## 2017-10-03 VITALS — BP 108/65 | HR 76 | Temp 98.8°F | Ht 73.0 in | Wt 139.2 lb

## 2017-10-03 DIAGNOSIS — Z8719 Personal history of other diseases of the digestive system: Secondary | ICD-10-CM

## 2017-10-03 DIAGNOSIS — E876 Hypokalemia: Secondary | ICD-10-CM | POA: Insufficient documentation

## 2017-10-03 DIAGNOSIS — Z8249 Family history of ischemic heart disease and other diseases of the circulatory system: Secondary | ICD-10-CM | POA: Insufficient documentation

## 2017-10-03 DIAGNOSIS — Z79899 Other long term (current) drug therapy: Secondary | ICD-10-CM | POA: Insufficient documentation

## 2017-10-03 DIAGNOSIS — K529 Noninfective gastroenteritis and colitis, unspecified: Secondary | ICD-10-CM | POA: Insufficient documentation

## 2017-10-03 NOTE — Progress Notes (Signed)
Assessment & Plan:  Lee Greer was seen today for establish care.  Diagnoses and all orders for this visit:  Hypokalemia -     Potassium; Future  History of gastroenteritis Stable resolved,  Continue Protonix as prescribed   Patient has been counseled on age-appropriate routine health concerns for screening and prevention. These are reviewed and up-to-date. Referrals have been placed accordingly. Immunizations are up-to-date or declined.    Subjective:   Chief Complaint  Patient presents with  . Establish Care    Patient is here to establish care. Patient stated he have not thrown up after his visit from the hospital. Patient stated he have been able to keep food and liquid down since discharge from the hospital.   HPI Lee Greer 52 y.o. male seen today as a new patient and and for hospital follow up.   Gastroenteritis Admitted to the ED on 10-02-2017 for acute gastritis. He had complaints of epigastric abdominal pain, N/V and also reported seeing blood in his stool. Potassium at that time was 2.7 and H/H 11/31. CT scan was unremarkable. He received IVF and was discharged home on protonix, carafate and PO KCL. He will need GI consult due to questionable PUD, blood seen in stool and low H/H.  Today he reports symptoms have resolved. He is to continue on protonix at this time. He still has a few more days left to take his potassium. Will repeat K in a few days.     Review of Systems  Constitutional: Negative for fever, malaise/fatigue and weight loss.  HENT: Negative.  Negative for nosebleeds.   Eyes: Negative.  Negative for blurred vision, double vision and photophobia.  Respiratory: Negative.  Negative for cough and shortness of breath.   Cardiovascular: Negative.  Negative for chest pain, palpitations and leg swelling.  Gastrointestinal: Negative.  Negative for abdominal pain, constipation, diarrhea, heartburn, nausea and vomiting.  Musculoskeletal: Negative.  Negative  for myalgias.  Neurological: Negative.  Negative for dizziness, focal weakness, seizures and headaches.  Endo/Heme/Allergies: Negative for environmental allergies.  Psychiatric/Behavioral: Negative.  Negative for suicidal ideas.    Past Medical History:  Diagnosis Date  . Acute kidney injury ([place]) 04/13/2017   related to "dehydration" (04/14/2017)  . Gastroparesis     History reviewed. No pertinent surgical history.  Family History  Problem Relation Age of Onset  . CAD Mother   . Diabetes Neg Hx   . Stroke Neg Hx     Social History Reviewed with no changes to be made today.   Outpatient Medications Prior to Visit  Medication Sig Dispense Refill  . pantoprazole (PROTONIX) 20 MG tablet Take 1 tablet (20 mg total) by mouth daily. 30 tablet 0  . potassium chloride (K-DUR) 10 MEQ tablet Take 2 tablets (20 mEq total) by mouth daily. 6 tablet 0  . sucralfate (CARAFATE) 1 g tablet Take 1 tablet (1 g total) by mouth 4 (four) times daily -  with meals and at bedtime. 120 tablet 0  . acetaminophen (TYLENOL) 500 MG tablet Take 1,000 mg by mouth every 8 (eight) hours as needed for moderate pain or fever.     . Multiple Vitamins-Minerals (MENS 50+ ADVANCED PO) Take 1 tablet by mouth daily.    . ondansetron (ZOFRAN ODT) 8 MG disintegrating tablet Take 1 tablet (8 mg total) by mouth every 8 (eight) hours as needed for nausea or vomiting. (Patient not taking: Reported on 10/03/2017) 12 tablet 0   No facility-administered medications prior to visit.  No Known Allergies     Objective:    BP 108/65 (BP Location: Left Arm, Patient Position: Sitting, Cuff Size: Normal)   Pulse 76   Temp 98.8 F (37.1 C) (Oral)   Ht 6\' 1"  (1.854 m)   Wt 139 lb 3.2 oz (63.1 kg)   SpO2 96%   BMI 18.37 kg/m  Wt Readings from Last 3 Encounters:  10/03/17 139 lb 3.2 oz (63.1 kg)  10/01/17 134 lb (60.8 kg)  09/17/17 134 lb 0.6 oz (60.8 kg)    Physical Exam  Constitutional: He is oriented to person, place,  and time. He appears well-developed and well-nourished. He is cooperative.  HENT:  Head: Normocephalic and atraumatic.  Eyes: EOM are normal.  Neck: Normal range of motion.  Cardiovascular: Normal rate, regular rhythm, normal heart sounds and intact distal pulses. Exam reveals no gallop and no friction rub.  No murmur heard. Pulmonary/Chest: Effort normal and breath sounds normal. No tachypnea. No respiratory distress. He has no decreased breath sounds. He has no wheezes. He has no rhonchi. He has no rales. He exhibits no tenderness.  Abdominal: Soft. Bowel sounds are normal.  Musculoskeletal: Normal range of motion. He exhibits no edema.  Neurological: He is alert and oriented to person, place, and time. Coordination normal.  Skin: Skin is warm and dry.  Psychiatric: He has a normal mood and affect. His behavior is normal. Judgment and thought content normal.  Nursing note and vitals reviewed.      Patient has been counseled extensively about nutrition and exercise as well as the importance of adherence with medications and regular follow-up. The patient was given clear instructions to go to ER or return to medical center if symptoms don't improve, worsen or new problems develop. The patient verbalized understanding.   Follow-up: Return in about 3 months (around 01/01/2018) for Needs appointment with financial representative. Please schedule w/ Diann on same day as lab visit. Needs physical and labwork in 2-3 months.Gildardo Pounds, FNP-BC [facility] and [facility] [place], [place]   10/05/2017, 8:17 PM

## 2017-10-03 NOTE — Patient Instructions (Addendum)

## 2017-10-05 ENCOUNTER — Encounter: Payer: Self-pay | Admitting: Nurse Practitioner

## 2017-10-10 ENCOUNTER — Ambulatory Visit: Payer: Self-pay | Attending: Nurse Practitioner

## 2017-10-10 DIAGNOSIS — E876 Hypokalemia: Secondary | ICD-10-CM | POA: Insufficient documentation

## 2017-10-10 NOTE — Progress Notes (Signed)
Patient here for lab visit only 

## 2017-10-11 LAB — POTASSIUM: Potassium: 4.1 mmol/L (ref 3.5–5.2)

## 2017-10-17 ENCOUNTER — Telehealth: Payer: Self-pay

## 2017-10-17 ENCOUNTER — Ambulatory Visit: Payer: Self-pay | Attending: Nurse Practitioner

## 2017-10-17 NOTE — Telephone Encounter (Signed)
CMA called patient to inform on lab results.  Patient understood.  

## 2017-10-17 NOTE — Telephone Encounter (Signed)
-----   Message from Gildardo Pounds, NP sent at 10/17/2017  9:11 AM EST ----- Potassium is normal at this time.

## 2018-01-02 ENCOUNTER — Ambulatory Visit: Payer: Self-pay | Admitting: Nurse Practitioner

## 2018-07-24 ENCOUNTER — Emergency Department (HOSPITAL_COMMUNITY)
Admission: EM | Admit: 2018-07-24 | Discharge: 2018-07-24 | Disposition: A | Discharge status: Home / Self Care | Payer: Self-pay | Attending: Emergency Medicine | Admitting: Emergency Medicine

## 2018-07-24 ENCOUNTER — Other Ambulatory Visit: Payer: Self-pay

## 2018-07-24 ENCOUNTER — Encounter (HOSPITAL_COMMUNITY): Payer: Self-pay | Admitting: Emergency Medicine

## 2018-07-24 DIAGNOSIS — R1010 Upper abdominal pain, unspecified: Secondary | ICD-10-CM | POA: Insufficient documentation

## 2018-07-24 DIAGNOSIS — R1115 Cyclical vomiting syndrome unrelated to migraine: Secondary | ICD-10-CM | POA: Insufficient documentation

## 2018-07-24 DIAGNOSIS — F1210 Cannabis abuse, uncomplicated: Secondary | ICD-10-CM | POA: Insufficient documentation

## 2018-07-24 DIAGNOSIS — Z79899 Other long term (current) drug therapy: Secondary | ICD-10-CM | POA: Insufficient documentation

## 2018-07-24 LAB — CBC
HCT: 41.5 % (ref 39.0–52.0)
HEMOGLOBIN: 13.8 g/dL (ref 13.0–17.0)
MCH: 30.5 pg (ref 26.0–34.0)
MCHC: 33.3 g/dL (ref 30.0–36.0)
MCV: 91.8 fL (ref 80.0–100.0)
Platelets: 258 10*3/uL (ref 150–400)
RBC: 4.52 MIL/uL (ref 4.22–5.81)
RDW: 12.9 % (ref 11.5–15.5)
WBC: 11.6 10*3/uL — ABNORMAL HIGH (ref 4.0–10.5)
nRBC: 0 % (ref 0.0–0.2)

## 2018-07-24 LAB — LIPASE, BLOOD: Lipase: 20 U/L (ref 11–51)

## 2018-07-24 LAB — COMPREHENSIVE METABOLIC PANEL
ALBUMIN: 5.1 g/dL — AB (ref 3.5–5.0)
ALK PHOS: 81 U/L (ref 38–126)
ALT: 21 U/L (ref 0–44)
AST: 25 U/L (ref 15–41)
Anion gap: 13 (ref 5–15)
BUN: 19 mg/dL (ref 6–20)
CO2: 21 mmol/L — ABNORMAL LOW (ref 22–32)
Calcium: 9.9 mg/dL (ref 8.9–10.3)
Chloride: 108 mmol/L (ref 98–111)
Creatinine, Ser: 0.85 mg/dL (ref 0.61–1.24)
GFR calc Af Amer: >60 mL/min (ref >60)
GFR calc non Af Amer: >60 mL/min (ref >60)
GLUCOSE: 166 mg/dL — AB (ref 70–99)
POTASSIUM: 4 mmol/L (ref 3.5–5.1)
Sodium: 142 mmol/L (ref 135–145)
TOTAL PROTEIN: 9.3 g/dL — AB (ref 6.5–8.1)
Total Bilirubin: 0.6 mg/dL (ref 0.3–1.2)

## 2018-07-24 MED ORDER — METOCLOPRAMIDE HCL 5 MG/ML IJ SOLN
10.0000 mg | Freq: Once | INTRAMUSCULAR | Status: AC
  Administered 2018-07-24: 10 mg via INTRAVENOUS
  Filled 2018-07-24: qty 2

## 2018-07-24 MED ORDER — LACTATED RINGERS IV BOLUS
1000.0000 mL | Freq: Once | INTRAVENOUS | Status: AC
  Administered 2018-07-24: 1000 mL via INTRAVENOUS

## 2018-07-24 MED ORDER — PROMETHAZINE HCL 25 MG PO TABS: 25.0000 mg | ORAL_TABLET | Freq: Four times a day (QID) | ORAL | 0 refills | Status: DC | PRN

## 2018-07-24 MED ORDER — KETOROLAC TROMETHAMINE 15 MG/ML IJ SOLN
15.0000 mg | Freq: Once | INTRAMUSCULAR | Status: AC
Start: 2018-07-24 — End: 2018-07-24
  Administered 2018-07-24: 15 mg via INTRAVENOUS
  Filled 2018-07-24: qty 1

## 2018-07-24 MED ORDER — ONDANSETRON 4 MG PO TBDP: 4.0000 mg | ORAL_TABLET | Freq: Once | ORAL | Status: DC | PRN

## 2018-07-24 MED ORDER — ONDANSETRON 4 MG PO TBDP: 4.0000 mg | ORAL_TABLET | Freq: Three times a day (TID) | ORAL | 0 refills | Status: DC | PRN

## 2018-07-24 MED ORDER — PANTOPRAZOLE SODIUM 20 MG PO TBEC: 20.0000 mg | DELAYED_RELEASE_TABLET | Freq: Every day | ORAL | 0 refills | Status: DC

## 2018-07-24 NOTE — ED Notes (Signed)
Bed: WA06 Expected date:  Expected time:  Means of arrival:  Comments: 

## 2018-07-24 NOTE — ED Provider Notes (Signed)
[facility] DEPT Provider Note   CSN: [id number] Arrival date & time: 07/24/18  2563     History   Chief Complaint Chief Complaint  Patient presents with  . Nausea  . Emesis    HPI Lee Greer is a 52 y.o. male.  HPI  52 year old male presents with vomiting since yesterday around noon.  He states he has had numerous episodes of vomiting and this is a recurrent problem for him.  No hematemesis, diarrhea.  He states that the has some upper abdominal pain that he thinks is from the vomiting and it feels sore.  When he is actively vomiting it feels worse like it is cramping, then there is just a residual soreness.  This is happened multiple times before and he describes it as being from gastroparesis.  He is not sure how this diagnosis was made.  He denies fever or recent illness.  He denies any significant alcohol use but does use marijuana daily.  He states he tried to quit in the past but could not do it. No dizziness but feels diffusely weak.  Past Medical History:  Diagnosis Date  . Acute kidney injury ([place]) 04/13/2017   related to "dehydration" (04/14/2017)  . Gastroparesis     Patient Active Problem List   Diagnosis Date Noted  . AKI (acute kidney injury) ([place]) 04/13/2017  . Dehydration 04/13/2017  . Nausea with vomiting 02/17/2015    History reviewed. No pertinent surgical history.      Home Medications    Prior to Admission medications   Medication Sig Start Date End Date Taking? Authorizing Provider  acetaminophen (TYLENOL) 500 MG tablet Take 1,000 mg by mouth every 8 (eight) hours as needed for moderate pain or fever.    Yes [provider]  Multiple Vitamins-Minerals (MENS 50+ ADVANCED PO) Take 1 tablet by mouth daily.   Yes [provider]  ondansetron (ZOFRAN ODT) 4 MG disintegrating tablet Take 1 tablet (4 mg total) by mouth every 8 (eight) hours as needed for nausea or vomiting. 07/24/18   Sherwood Gambler, MD  pantoprazole (PROTONIX) 20 MG tablet Take 1 tablet (20 mg total) by mouth daily. 07/24/18   Sherwood Gambler, MD  promethazine (PHENERGAN) 25 MG tablet Take 1 tablet (25 mg total) by mouth every 6 (six) hours as needed for nausea or vomiting. 07/24/18   Sherwood Gambler, MD    Family History Family History  Problem Relation Age of Onset  . CAD Mother   . Diabetes Neg Hx   . Stroke Neg Hx     Social History Social History   Tobacco Use  . Smoking status: Never Smoker  . Smokeless tobacco: Never Used  Substance Use Topics  . 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 Drug use: Yes    Types: Marijuana    Comment: 04/14/2017 "daily"     Allergies   Patient has no known allergies.   Review of Systems Review of Systems  Constitutional: Positive for chills. Negative for fever.  Cardiovascular: Negative for chest pain.  Gastrointestinal: Positive for abdominal pain, nausea and vomiting. Negative for diarrhea.  Musculoskeletal: Negative for back pain.  Neurological: Positive for weakness. Negative for dizziness.  All other systems reviewed and are negative.    Physical Exam Updated Vital Signs BP (!) 146/80   Pulse 73   Temp 98.6 F (37 C) (Oral)   Resp 16   SpO2 100%   Physical Exam  Constitutional: He appears well-developed and well-nourished.  HENT:  Head: Normocephalic and atraumatic.  Right Ear: External ear normal.  Left Ear: External ear normal.  Nose: Nose normal.  Mouth/Throat: Mucous membranes are dry.  Eyes: Right eye exhibits no discharge. Left eye exhibits no discharge.  Neck: Neck supple.  Cardiovascular: Normal rate, regular rhythm and normal heart sounds.  Pulmonary/Chest: Effort normal and breath sounds normal.  Abdominal: Soft. There is tenderness (mild) in the epigastric area.  Musculoskeletal: He exhibits no edema.  Neurological: He is alert.  Skin: Skin is warm and dry.  Psychiatric: His mood appears not anxious.   Nursing note and vitals reviewed.    ED Treatments / Results  Labs (all labs ordered are listed, but only abnormal results are displayed) Labs Reviewed  COMPREHENSIVE METABOLIC PANEL - Abnormal; Notable for the following components:      Result Value   CO2 21 (*)    Glucose, Bld 166 (*)    Total Protein 9.3 (*)    Albumin 5.1 (*)    All other components within normal limits  CBC - Abnormal; Notable for the following components:   WBC 11.6 (*)    All other components within normal limits  LIPASE, BLOOD    EKG None  Radiology No results found.  Procedures Procedures (including critical care time)  Medications Ordered in ED Medications  lactated ringers bolus 1,000 mL (0 mLs Intravenous Stopped 07/24/18 0950)  metoCLOPramide (REGLAN) injection 10 mg (10 mg Intravenous Given 07/24/18 0753)  ketorolac (TORADOL) 15 MG/ML injection 15 mg (15 mg Intravenous Given 07/24/18 0829)     Initial Impression / Assessment and Plan / ED Course  I have reviewed the triage vital signs and the nursing notes.  Pertinent labs & imaging results that were available during my care of the patient were reviewed by me and considered in my medical decision making (see chart for details).     Patient feels better after IV treatment.  He is tolerating oral fluids.  Vitals are reassuring at this time and his labs do not show any significant abnormalities including no significant electrolyte disturbance or renal failure.  Given this is happened multiple times I highly doubt an acute intra-abdominal emergency and do not think CT scanning or ultrasound imaging would be beneficial.  He was encouraged to try to stay away from marijuana and will be referred to PCP.  Return precautions discussed.  Final Clinical Impressions(s) / ED Diagnoses   Final diagnoses:  Cyclic vomiting syndrome    ED Discharge Orders         Ordered    ondansetron (ZOFRAN ODT) 4 MG disintegrating tablet  Every 8 hours PRN       07/24/18 1011    promethazine (PHENERGAN) 25 MG tablet  Every 6 hours PRN     07/24/18 1011    pantoprazole (PROTONIX) 20 MG tablet  Daily     07/24/18 1013           Sherwood Gambler, MD 07/24/18 1519

## 2018-07-24 NOTE — ED Triage Notes (Addendum)
Pt presents with persistent N/V since noon yesterday. Patient states unable to hold down even ice chips. Endorses chills but denies diarrhea, constipation, and urinary symptoms. Hx gastroparesis and marijuana use.

## 2018-07-24 NOTE — Discharge Instructions (Signed)
If you develop severe abdominal pain, worsening vomiting, fever, blood in your vomit, or any other new/concerning symptoms and return to the ER for evaluation.

## 2018-07-24 NOTE — ED Notes (Signed)
Pt tolerated sips of ginger ale.

## 2018-09-08 ENCOUNTER — Encounter (HOSPITAL_COMMUNITY): Payer: Self-pay | Admitting: *Deleted

## 2018-09-08 ENCOUNTER — Other Ambulatory Visit: Payer: Self-pay

## 2018-09-08 ENCOUNTER — Emergency Department (HOSPITAL_COMMUNITY)
Admission: EM | Admit: 2018-09-08 | Discharge: 2018-09-08 | Disposition: A | Discharge status: Home / Self Care | Payer: Self-pay | Attending: Emergency Medicine | Admitting: Emergency Medicine

## 2018-09-08 DIAGNOSIS — E860 Dehydration: Secondary | ICD-10-CM | POA: Insufficient documentation

## 2018-09-08 DIAGNOSIS — R112 Nausea with vomiting, unspecified: Secondary | ICD-10-CM | POA: Insufficient documentation

## 2018-09-08 DIAGNOSIS — R197 Diarrhea, unspecified: Secondary | ICD-10-CM | POA: Insufficient documentation

## 2018-09-08 DIAGNOSIS — R739 Hyperglycemia, unspecified: Secondary | ICD-10-CM | POA: Insufficient documentation

## 2018-09-08 LAB — COMPREHENSIVE METABOLIC PANEL
ALT: 18 U/L (ref 0–44)
AST: 24 U/L (ref 15–41)
Albumin: 5.2 g/dL — ABNORMAL HIGH (ref 3.5–5.0)
Alkaline Phosphatase: 73 U/L (ref 38–126)
Anion gap: 14 (ref 5–15)
BILIRUBIN TOTAL: 0.4 mg/dL (ref 0.3–1.2)
BUN: 18 mg/dL (ref 6–20)
CHLORIDE: 105 mmol/L (ref 98–111)
CO2: 19 mmol/L — ABNORMAL LOW (ref 22–32)
CREATININE: 0.85 mg/dL (ref 0.61–1.24)
Calcium: 9.5 mg/dL (ref 8.9–10.3)
Glucose, Bld: 204 mg/dL — ABNORMAL HIGH (ref 70–99)
POTASSIUM: 3.8 mmol/L (ref 3.5–5.1)
Sodium: 138 mmol/L (ref 135–145)
TOTAL PROTEIN: 9.1 g/dL — AB (ref 6.5–8.1)

## 2018-09-08 LAB — CBC
HEMATOCRIT: 41.7 % (ref 39.0–52.0)
Hemoglobin: 14 g/dL (ref 13.0–17.0)
MCH: 30.9 pg (ref 26.0–34.0)
MCHC: 33.6 g/dL (ref 30.0–36.0)
MCV: 92.1 fL (ref 80.0–100.0)
PLATELETS: 260 10*3/uL (ref 150–400)
RBC: 4.53 MIL/uL (ref 4.22–5.81)
RDW: 13.2 % (ref 11.5–15.5)
WBC: 13 10*3/uL — AB (ref 4.0–10.5)
nRBC: 0 % (ref 0.0–0.2)

## 2018-09-08 LAB — URINALYSIS, ROUTINE W REFLEX MICROSCOPIC
BACTERIA UA: NONE SEEN
BILIRUBIN URINE: NEGATIVE
GLUCOSE, UA: 50 mg/dL — AB
Ketones, ur: 5 mg/dL — AB
LEUKOCYTES UA: NEGATIVE
NITRITE: NEGATIVE
PH: 6 (ref 5.0–8.0)
Protein, ur: NEGATIVE mg/dL
SPECIFIC GRAVITY, URINE: 1.018 (ref 1.005–1.030)

## 2018-09-08 LAB — LIPASE, BLOOD: LIPASE: 23 U/L (ref 11–51)

## 2018-09-08 MED ORDER — KETOROLAC TROMETHAMINE 30 MG/ML IJ SOLN
15.0000 mg | Freq: Once | INTRAMUSCULAR | Status: AC
  Administered 2018-09-08: 15 mg via INTRAVENOUS
  Filled 2018-09-08: qty 1

## 2018-09-08 MED ORDER — ONDANSETRON HCL 8 MG PO TABS: 8.0000 mg | ORAL_TABLET | Freq: Three times a day (TID) | ORAL | 0 refills | Status: DC | PRN

## 2018-09-08 MED ORDER — SODIUM CHLORIDE 0.9 % IV BOLUS
2000.0000 mL | Freq: Once | INTRAVENOUS | Status: AC
  Administered 2018-09-08: 2000 mL via INTRAVENOUS

## 2018-09-08 MED ORDER — ONDANSETRON HCL 4 MG/2ML IJ SOLN
4.0000 mg | Freq: Once | INTRAMUSCULAR | Status: AC
  Administered 2018-09-08: 4 mg via INTRAVENOUS
  Filled 2018-09-08: qty 2

## 2018-09-08 MED ORDER — ONDANSETRON 4 MG PO TBDP: 4.0000 mg | ORAL_TABLET | Freq: Once | ORAL | Status: DC | PRN

## 2018-09-08 MED ORDER — LORAZEPAM 2 MG/ML IJ SOLN
1.0000 mg | Freq: Once | INTRAMUSCULAR | Status: AC
  Administered 2018-09-08: 1 mg via INTRAVENOUS
  Filled 2018-09-08: qty 1

## 2018-09-08 NOTE — ED Notes (Signed)
Informed pt urine is needed

## 2018-09-08 NOTE — ED Triage Notes (Signed)
Pt c/o v/d since yesterday around 1400.

## 2018-09-08 NOTE — Discharge Instructions (Addendum)
Start with a clear liquid diet then gradually advance to regular foods.  A prescription for nausea medicine was sent to your pharmacy.  Your blood sugar was mildly elevated today.  You may also be mildly dehydrated.  It is important to follow-up with your doctor for checkup on your blood sugar and hydration status in a week or so.  Use the attached information to help you find a doctor.

## 2018-09-08 NOTE — ED Provider Notes (Signed)
[facility] DEPT Provider Note   CSN: [id number] Arrival date & time: 09/08/18  8341     History   Chief Complaint Chief Complaint  Patient presents with  . Emesis    HPI Lee Greer is a 53 y.o. male.  HPI   He presents for evaluation of nausea, vomiting and diarrhea all of which started this morning.  He complains of crampy abdominal pain.  He denies fever, chills, cough, shortness of breath or chest pain.  There are no other known modifying factors.  Past Medical History:  Diagnosis Date  . Acute kidney injury ([place]) 04/13/2017   related to "dehydration" (04/14/2017)  . Gastroparesis     Patient Active Problem List   Diagnosis Date Noted  . AKI (acute kidney injury) ([place]) 04/13/2017  . Dehydration 04/13/2017  . Nausea with vomiting 02/17/2015    History reviewed. No pertinent surgical history.      Home Medications    Prior to Admission medications   Medication Sig Start Date End Date Taking? Authorizing Provider  acetaminophen (TYLENOL) 500 MG tablet Take 1,000 mg by mouth every 8 (eight) hours as needed for moderate pain or fever.     [provider]  Multiple Vitamins-Minerals (MENS 50+ ADVANCED PO) Take 1 tablet by mouth daily.    [provider]  ondansetron (ZOFRAN ODT) 4 MG disintegrating tablet Take 1 tablet (4 mg total) by mouth every 8 (eight) hours as needed for nausea or vomiting. 07/24/18   Sherwood Gambler, MD  ondansetron (ZOFRAN) 8 MG tablet Take 1 tablet (8 mg total) by mouth every 8 (eight) hours as needed for nausea or vomiting. 09/08/18   Daleen Bo, MD  pantoprazole (PROTONIX) 20 MG tablet Take 1 tablet (20 mg total) by mouth daily. 07/24/18   Sherwood Gambler, MD  promethazine (PHENERGAN) 25 MG tablet Take 1 tablet (25 mg total) by mouth every 6 (six) hours as needed for nausea or vomiting. 07/24/18   Sherwood Gambler, MD    Family History Family History  Problem Relation Age of Onset  .  CAD Mother   . Diabetes Neg Hx   . Stroke Neg Hx     Social History Social History   Tobacco Use  . Smoking status: Never Smoker  . Smokeless tobacco: Never Used  Substance Use Topics  . 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 Drug use: Yes    Types: Marijuana    Comment: 04/14/2017 "daily"     Allergies   Patient has no known allergies.   Review of Systems Review of Systems  All other systems reviewed and are negative.    Physical Exam Updated Vital Signs BP (!) 144/99 (BP Location: Left Arm)   Pulse 78   Temp 98.8 F (37.1 C) (Oral)   Resp 16   Ht 6' (1.829 m)   Wt 66.7 kg   SpO2 100%   BMI 19.94 kg/m   Physical Exam Vitals signs and nursing note reviewed.  Constitutional:      Appearance: He is well-developed. He is not ill-appearing or diaphoretic.  HENT:     Head: Normocephalic and atraumatic.     Right Ear: External ear normal.     Left Ear: External ear normal.  Eyes:     Conjunctiva/sclera: Conjunctivae normal.     Pupils: Pupils are equal, round, and reactive to light.  Neck:     Musculoskeletal: Normal range of motion and neck supple.  Trachea: Phonation normal.  Cardiovascular:     Rate and Rhythm: Normal rate and regular rhythm.     Heart sounds: Normal heart sounds.  Pulmonary:     Effort: Pulmonary effort is normal.     Breath sounds: Normal breath sounds.  Abdominal:     Palpations: Abdomen is soft.     Tenderness: There is no abdominal tenderness.  Musculoskeletal: Normal range of motion.  Skin:    General: Skin is warm and dry.  Neurological:     Mental Status: He is alert and oriented to person, place, and time.     Cranial Nerves: No cranial nerve deficit.     Sensory: No sensory deficit.     Motor: No abnormal muscle tone.     Coordination: Coordination normal.  Psychiatric:        Behavior: Behavior normal.        Thought Content: Thought content normal.        Judgment: Judgment normal.       ED Treatments / Results  Labs (all labs ordered are listed, but only abnormal results are displayed) Labs Reviewed  COMPREHENSIVE METABOLIC PANEL - Abnormal; Notable for the following components:      Result Value   CO2 19 (*)    Glucose, Bld 204 (*)    Total Protein 9.1 (*)    Albumin 5.2 (*)    All other components within normal limits  CBC - Abnormal; Notable for the following components:   WBC 13.0 (*)    All other components within normal limits  URINALYSIS, ROUTINE W REFLEX MICROSCOPIC - Abnormal; Notable for the following components:   Glucose, UA 50 (*)    Hgb urine dipstick MODERATE (*)    Ketones, ur 5 (*)    All other components within normal limits  LIPASE, BLOOD    EKG None  Radiology No results found.  Procedures Procedures (including critical care time)  Medications Ordered in ED Medications  ondansetron (ZOFRAN) injection 4 mg (4 mg Intravenous Given 09/08/18 0744)  sodium chloride 0.9 % bolus 2,000 mL (0 mLs Intravenous Stopped 09/08/18 1056)  ketorolac (TORADOL) 30 MG/ML injection 15 mg (15 mg Intravenous Given 09/08/18 0745)  LORazepam (ATIVAN) injection 1 mg (1 mg Intravenous Given 09/08/18 0746)     Initial Impression / Assessment and Plan / ED Course  I have reviewed the triage vital signs and the nursing notes.  Pertinent labs & imaging results that were available during my care of the patient were reviewed by me and considered in my medical decision making (see chart for details).  Clinical Course as of Sep 09 1045  Fri Sep 08, 2018  1200 Normal except dipstick positive for glucose, and hemoglobin.  Urinalysis, Routine w reflex microscopic(!) [EW]  1200 Normal except CO2 low, glucose high, total protein high, albumin high  Comprehensive metabolic panel(!) [EW]  6712 Normal except white count high  CBC(!) [EW]    Clinical Course User Index [EW] Daleen Bo, MD     Patient Vitals for the past 24 hrs:  BP Pulse Resp SpO2  09/08/18  1239 (!) 144/99 78 16 100 %    12:01 PM Reevaluation with update and discussion. After initial assessment and treatment, an updated evaluation reveals he is lying on the stretcher in a prone position.  He states he feels better.  He is talking very softly at this time.  He feels like he is good to go home, and requested prescription sent to his  pharmacy.  Findings discussed with the patient and all questions were answered. Daleen Bo   Medical Decision Making: Evaluation consistent with nonspecific enteritis, and likely mild dehydration.  Concern for diabetes with elevated blood glucose.  He has had elevated glucose in the past.  Doubt DKA, metabolic instability or impending vascular collapse.  CRITICAL CARE-no Performed by: Daleen Bo  Nursing Notes Reviewed/ Care Coordinated Applicable Imaging Reviewed Interpretation of Laboratory Data incorporated into ED treatment  The patient appears reasonably screened and/or stabilized for discharge and I doubt any other medical condition or other [facility] requiring further screening, evaluation, or treatment in the ED at this time prior to discharge.  Plan: Home Medications-OTC analgesia/antipyretic if needed.; Home Treatments-rest, gradually advance diet; return here if the recommended treatment, does not improve the symptoms; Recommended follow up-PCP of choice, PRN   Final Clinical Impressions(s) / ED Diagnoses   Final diagnoses:  Nausea vomiting and diarrhea  Hyperglycemia  Dehydration    ED Discharge Orders         Ordered    ondansetron (ZOFRAN) 8 MG tablet  Every 8 hours PRN     09/08/18 1206           Daleen Bo, MD 09/09/18 1047

## 2018-09-11 ENCOUNTER — Encounter (HOSPITAL_COMMUNITY): Payer: Self-pay

## 2018-09-11 ENCOUNTER — Emergency Department (HOSPITAL_COMMUNITY)
Admission: EM | Admit: 2018-09-11 | Discharge: 2018-09-11 | Disposition: A | Discharge status: Home / Self Care | Payer: Self-pay | Attending: Emergency Medicine | Admitting: Emergency Medicine

## 2018-09-11 ENCOUNTER — Other Ambulatory Visit: Payer: Self-pay

## 2018-09-11 DIAGNOSIS — E860 Dehydration: Secondary | ICD-10-CM

## 2018-09-11 DIAGNOSIS — R112 Nausea with vomiting, unspecified: Secondary | ICD-10-CM

## 2018-09-11 DIAGNOSIS — R197 Diarrhea, unspecified: Secondary | ICD-10-CM | POA: Insufficient documentation

## 2018-09-11 DIAGNOSIS — Z79899 Other long term (current) drug therapy: Secondary | ICD-10-CM | POA: Insufficient documentation

## 2018-09-11 LAB — COMPREHENSIVE METABOLIC PANEL
ALT: 18 U/L (ref 0–44)
ANION GAP: 13 (ref 5–15)
AST: 20 U/L (ref 15–41)
Albumin: 5.2 g/dL — ABNORMAL HIGH (ref 3.5–5.0)
Alkaline Phosphatase: 71 U/L (ref 38–126)
BUN: 45 mg/dL — ABNORMAL HIGH (ref 6–20)
CO2: 21 mmol/L — ABNORMAL LOW (ref 22–32)
Calcium: 9.4 mg/dL (ref 8.9–10.3)
Chloride: 104 mmol/L (ref 98–111)
Creatinine, Ser: 1.66 mg/dL — ABNORMAL HIGH (ref 0.61–1.24)
GFR calc Af Amer: 54 mL/min — ABNORMAL LOW (ref >60)
GFR calc non Af Amer: 47 mL/min — ABNORMAL LOW (ref >60)
Glucose, Bld: 128 mg/dL — ABNORMAL HIGH (ref 70–99)
POTASSIUM: 3.3 mmol/L — AB (ref 3.5–5.1)
Sodium: 138 mmol/L (ref 135–145)
Total Bilirubin: 1.4 mg/dL — ABNORMAL HIGH (ref 0.3–1.2)
Total Protein: 9 g/dL — ABNORMAL HIGH (ref 6.5–8.1)

## 2018-09-11 LAB — CBC
HCT: 45.7 % (ref 39.0–52.0)
HEMOGLOBIN: 15.3 g/dL (ref 13.0–17.0)
MCH: 30.5 pg (ref 26.0–34.0)
MCHC: 33.5 g/dL (ref 30.0–36.0)
MCV: 91 fL (ref 80.0–100.0)
Platelets: 261 10*3/uL (ref 150–400)
RBC: 5.02 MIL/uL (ref 4.22–5.81)
RDW: 12.9 % (ref 11.5–15.5)
WBC: 9.6 10*3/uL (ref 4.0–10.5)
nRBC: 0 % (ref 0.0–0.2)

## 2018-09-11 LAB — LIPASE, BLOOD: LIPASE: 29 U/L (ref 11–51)

## 2018-09-11 MED ORDER — SODIUM CHLORIDE 0.9 % IV BOLUS
1000.0000 mL | Freq: Once | INTRAVENOUS | Status: AC
  Administered 2018-09-11: 1000 mL via INTRAVENOUS

## 2018-09-11 MED ORDER — LORAZEPAM 2 MG/ML IJ SOLN
1.0000 mg | Freq: Once | INTRAMUSCULAR | Status: AC
  Administered 2018-09-11: 1 mg via INTRAVENOUS
  Filled 2018-09-11: qty 1

## 2018-09-11 MED ORDER — KETOROLAC TROMETHAMINE 15 MG/ML IJ SOLN
15.0000 mg | Freq: Once | INTRAMUSCULAR | Status: AC
  Administered 2018-09-11: 15 mg via INTRAVENOUS
  Filled 2018-09-11: qty 1

## 2018-09-11 MED ORDER — POTASSIUM CHLORIDE 10 MEQ/100ML IV SOLN
10.0000 meq | Freq: Once | INTRAVENOUS | Status: AC
  Administered 2018-09-11: 10 meq via INTRAVENOUS
  Filled 2018-09-11: qty 100

## 2018-09-11 MED ORDER — ONDANSETRON HCL 4 MG/2ML IJ SOLN
4.0000 mg | Freq: Once | INTRAMUSCULAR | Status: AC
  Administered 2018-09-11: 4 mg via INTRAVENOUS
  Filled 2018-09-11: qty 2

## 2018-09-11 MED ORDER — PROMETHAZINE HCL 25 MG PO TABS: 25.0000 mg | ORAL_TABLET | Freq: Four times a day (QID) | ORAL | 0 refills | Status: DC | PRN

## 2018-09-11 MED ORDER — PROMETHAZINE HCL 25 MG/ML IJ SOLN
12.5000 mg | Freq: Once | INTRAMUSCULAR | Status: AC
  Administered 2018-09-11: 12.5 mg via INTRAVENOUS
  Filled 2018-09-11: qty 1

## 2018-09-11 NOTE — ED Provider Notes (Signed)
[facility] DEPT Provider Note   CSN: [id number] Arrival date & time: 09/11/18  0754     History   Chief Complaint Chief Complaint  Patient presents with  . Emesis    HPI JOELLE FLESSNER is a 53 y.o. male.  53 year old male with prior medical history as detailed below presents for evaluation of nausea and vomiting.  Patient with recent visit to the ED 3 days ago for same complaint.  He reports continued episodes of nausea and vomiting despite the use of Zofran at home.  His last use of Zofran was last night.  He denies associated fever, chest pain, shortness of breath, significant abdominal discomfort.  He does report mild associated diarrhea.  No bloody emesis or blood in stool noted.  Of note, patient does have a prior history of possible cyclic vomiting possibly related to marijuana use.  He reports that his last use of marijuana was 1 month ago.  The history is provided by the patient and medical records.  Emesis   This is a recurrent problem. The current episode started 2 days ago. The problem occurs 2 to 4 times per day. The problem has not changed since onset.The emesis has an appearance of stomach contents. There has been no fever. Associated symptoms include diarrhea. Pertinent negatives include no abdominal pain.    Past Medical History:  Diagnosis Date  . Acute kidney injury ([place]) 04/13/2017   related to "dehydration" (04/14/2017)  . Gastroparesis     Patient Active Problem List   Diagnosis Date Noted  . AKI (acute kidney injury) ([place]) 04/13/2017  . Dehydration 04/13/2017  . Nausea with vomiting 02/17/2015    History reviewed. No pertinent surgical history.      Home Medications    Prior to Admission medications   Medication Sig Start Date End Date Taking? Authorizing Provider  acetaminophen (TYLENOL) 325 MG tablet Take 650 mg by mouth every 6 (six) hours as needed for mild pain or headache.   Yes [provider]    Multiple Vitamins-Minerals (MENS 50+ ADVANCED PO) Take 1 tablet by mouth daily.   Yes [provider]  ondansetron (ZOFRAN) 8 MG tablet Take 1 tablet (8 mg total) by mouth every 8 (eight) hours as needed for nausea or vomiting. 09/08/18  Yes Daleen Bo, MD  acetaminophen (TYLENOL) 500 MG tablet Take 1,000 mg by mouth every 8 (eight) hours as needed for moderate pain or fever.     [provider]  ondansetron (ZOFRAN ODT) 4 MG disintegrating tablet Take 1 tablet (4 mg total) by mouth every 8 (eight) hours as needed for nausea or vomiting. Patient not taking: Reported on 09/11/2018 07/24/18   Sherwood Gambler, MD  pantoprazole (PROTONIX) 20 MG tablet Take 1 tablet (20 mg total) by mouth daily. Patient not taking: Reported on 09/11/2018 07/24/18   Sherwood Gambler, MD  promethazine (PHENERGAN) 25 MG tablet Take 1 tablet (25 mg total) by mouth every 6 (six) hours as needed for nausea or vomiting. Patient not taking: Reported on 09/11/2018 07/24/18   Sherwood Gambler, MD    Family History Family History  Problem Relation Age of Onset  . CAD Mother   . Diabetes Neg Hx   . Stroke Neg Hx     Social History Social History   Tobacco Use  . Smoking status: Never Smoker  . Smokeless tobacco: Never Used  Substance Use Topics  . 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  Drug use: Not Currently    Types: Marijuana    Comment: last used 1 month ago     Allergies   Patient has no known allergies.   Review of Systems Review of Systems  Gastrointestinal: Positive for diarrhea and vomiting. Negative for abdominal pain.  All other systems reviewed and are negative.    Physical Exam Updated Vital Signs BP (!) 160/107 (BP Location: Left Arm)   Pulse 100   Temp 98.5 F (36.9 C) (Oral)   Resp 16   Ht 6' (1.829 m)   Wt 56.7 kg   SpO2 100%   BMI 16.95 kg/m   Physical Exam Vitals signs and nursing note reviewed.  Constitutional:      General: He is not  in acute distress.    Appearance: Normal appearance. He is well-developed.  HENT:     Head: Normocephalic and atraumatic.  Eyes:     Conjunctiva/sclera: Conjunctivae normal.     Pupils: Pupils are equal, round, and reactive to light.  Neck:     Musculoskeletal: Normal range of motion and neck supple.  Cardiovascular:     Rate and Rhythm: Normal rate and regular rhythm.     Heart sounds: Normal heart sounds.  Pulmonary:     Effort: Pulmonary effort is normal. No respiratory distress.     Breath sounds: Normal breath sounds.  Abdominal:     General: Abdomen is flat. Bowel sounds are normal. There is no distension.     Palpations: Abdomen is soft.     Tenderness: There is no abdominal tenderness.  Musculoskeletal: Normal range of motion.        General: No deformity.  Skin:    General: Skin is warm and dry.  Neurological:     Mental Status: He is alert and oriented to person, place, and time.      ED Treatments / Results  Labs (all labs ordered are listed, but only abnormal results are displayed) Labs Reviewed  COMPREHENSIVE METABOLIC PANEL - Abnormal; Notable for the following components:      Result Value   Potassium 3.3 (*)    CO2 21 (*)    Glucose, Bld 128 (*)    BUN 45 (*)    Creatinine, Ser 1.66 (*)    Total Protein 9.0 (*)    Albumin 5.2 (*)    Total Bilirubin 1.4 (*)    GFR calc non Af Amer 47 (*)    GFR calc Af Amer 54 (*)    All other components within normal limits  LIPASE, BLOOD  CBC  URINALYSIS, ROUTINE W REFLEX MICROSCOPIC    EKG None  Radiology No results found.  Procedures Procedures (including critical care time)  Medications Ordered in ED Medications  sodium chloride 0.9 % bolus 1,000 mL (has no administration in time range)  ondansetron (ZOFRAN) injection 4 mg (has no administration in time range)  LORazepam (ATIVAN) injection 1 mg (has no administration in time range)  ketorolac (TORADOL) 15 MG/ML injection 15 mg (has no administration  in time range)     Initial Impression / Assessment and Plan / ED Course  I have reviewed the triage vital signs and the nursing notes.  Pertinent labs & imaging results that were available during my care of the patient were reviewed by me and considered in my medical decision making (see chart for details).     MDM  Screen complete  Patient is presenting for evaluation of nausea, vomiting, and diarrhea.  Patient's symptoms  have been ongoing for several days.  He reports minimal control with home use of Zofran.  Patient is moderately dehydrated on his exam and on labs.   Following administration of IV fluids, antiemetics, and replacement of potassium he feels improved.  Patient without further emesis while in the ED.  Patient appears to be appropriate for discharge.    He does understand the need for close follow-up.  Strict return precautions are given and understood.   Final Clinical Impressions(s) / ED Diagnoses   Final diagnoses:  Nausea vomiting and diarrhea  Dehydration    ED Discharge Orders         Ordered    promethazine (PHENERGAN) 25 MG tablet  Every 6 hours PRN     09/11/18 1121           Valarie Merino, MD 09/11/18 1123

## 2018-09-11 NOTE — Discharge Instructions (Addendum)
Please return for any problem.  Drink plenty of clear fluids.  Take Zofran and Phenergan as previously prescribed.

## 2018-09-11 NOTE — ED Triage Notes (Signed)
Patient reports that he began vomiting 3 days ago. Patient was seen 2 days ago and was prescribed zofran. Patient states it has not helped and continues to vomit.

## 2018-09-11 NOTE — ED Notes (Signed)
Patient aware we need urine sample. Urinal at bedside. Patient will call out after sample is provided. Patient states, " Imma need two bags of fluids in order for me to pee because I'm dehydrated."

## 2018-09-18 ENCOUNTER — Encounter (HOSPITAL_COMMUNITY): Payer: Self-pay | Admitting: Family Medicine

## 2018-09-18 DIAGNOSIS — E876 Hypokalemia: Secondary | ICD-10-CM | POA: Insufficient documentation

## 2018-09-18 DIAGNOSIS — R112 Nausea with vomiting, unspecified: Secondary | ICD-10-CM | POA: Insufficient documentation

## 2018-09-18 NOTE — ED Triage Notes (Signed)
Patient reports he is experiencing vomiting when drinks or attempts to eat. Patient denies active abd pain or nausea. However, when he vomits he reports experiencing upper gastric pain. Last time he vomited was this morning. Patient was seen on 01/03 and 01/06 for the same symptoms.

## 2018-09-19 ENCOUNTER — Emergency Department (HOSPITAL_COMMUNITY)
Admission: EM | Admit: 2018-09-19 | Discharge: 2018-09-19 | Disposition: A | Discharge status: Home / Self Care | Payer: Self-pay | Attending: Emergency Medicine | Admitting: Emergency Medicine

## 2018-09-19 DIAGNOSIS — R112 Nausea with vomiting, unspecified: Secondary | ICD-10-CM

## 2018-09-19 DIAGNOSIS — E876 Hypokalemia: Secondary | ICD-10-CM

## 2018-09-19 LAB — CBC WITH DIFFERENTIAL/PLATELET
Abs Immature Granulocytes: 0.02 10*3/uL (ref 0.00–0.07)
Basophils Absolute: 0 10*3/uL (ref 0.0–0.1)
Basophils Relative: 0 %
Eosinophils Absolute: 0.1 10*3/uL (ref 0.0–0.5)
Eosinophils Relative: 1 %
HCT: 41.7 % (ref 39.0–52.0)
Hemoglobin: 14.1 g/dL (ref 13.0–17.0)
IMMATURE GRANULOCYTES: 0 %
Lymphocytes Relative: 56 %
Lymphs Abs: 4.9 10*3/uL — ABNORMAL HIGH (ref 0.7–4.0)
MCH: 30.5 pg (ref 26.0–34.0)
MCHC: 33.8 g/dL (ref 30.0–36.0)
MCV: 90.1 fL (ref 80.0–100.0)
Monocytes Absolute: 0.8 10*3/uL (ref 0.1–1.0)
Monocytes Relative: 9 %
Neutro Abs: 3 10*3/uL (ref 1.7–7.7)
Neutrophils Relative %: 34 %
Platelets: 270 10*3/uL (ref 150–400)
RBC: 4.63 MIL/uL (ref 4.22–5.81)
RDW: 12 % (ref 11.5–15.5)
WBC: 8.9 10*3/uL (ref 4.0–10.5)
nRBC: 0 % (ref 0.0–0.2)

## 2018-09-19 LAB — BASIC METABOLIC PANEL
Anion gap: 13 (ref 5–15)
BUN: 22 mg/dL — ABNORMAL HIGH (ref 6–20)
CO2: 24 mmol/L (ref 22–32)
Calcium: 9.1 mg/dL (ref 8.9–10.3)
Chloride: 102 mmol/L (ref 98–111)
Creatinine, Ser: 0.96 mg/dL (ref 0.61–1.24)
GFR calc non Af Amer: >60 mL/min (ref >60)
Glucose, Bld: 93 mg/dL (ref 70–99)
Potassium: 3 mmol/L — ABNORMAL LOW (ref 3.5–5.1)
Sodium: 139 mmol/L (ref 135–145)

## 2018-09-19 MED ORDER — POTASSIUM CHLORIDE CRYS ER 20 MEQ PO TBCR
40.0000 meq | EXTENDED_RELEASE_TABLET | Freq: Once | ORAL | Status: AC
  Administered 2018-09-19: 40 meq via ORAL
  Filled 2018-09-19: qty 2

## 2018-09-19 MED ORDER — POTASSIUM CHLORIDE ER 10 MEQ PO TBCR: 10.0000 meq | EXTENDED_RELEASE_TABLET | Freq: Every day | ORAL | 0 refills | Status: DC

## 2018-09-19 MED ORDER — PROMETHAZINE HCL 25 MG/ML IJ SOLN
25.0000 mg | Freq: Once | INTRAMUSCULAR | Status: AC
  Administered 2018-09-19: 25 mg via INTRAVENOUS
  Filled 2018-09-19: qty 1

## 2018-09-19 MED ORDER — SODIUM CHLORIDE 0.9 % IV BOLUS
1000.0000 mL | Freq: Once | INTRAVENOUS | Status: AC
  Administered 2018-09-19: 1000 mL via INTRAVENOUS

## 2018-09-19 NOTE — ED Notes (Signed)
PT have been made aware of urine sample. PT state he have not been able to use restroom since yesterday. Once IV in might be able to provide sample

## 2018-09-19 NOTE — ED Provider Notes (Signed)
[place] DEPT Provider Note: Georgena Spurling, MD, FACEP  CSN: [id number] MRN: [id number] ARRIVAL: 09/18/18 at [place]: [place]  09/19/18 3:57 AM Lee Greer is a 53 y.o. male with a history of episodic vomiting.  He has been variously diagnosed with gastroparesis, cyclic vomiting syndrome and cannabis induced hyperemesis syndrome.  He was seen in the ED for similar symptoms on the sixth of this month.  He states he had been doing well on prescribed promethazine.  He has not used marijuana in 30 days.  He had a return of nausea yesterday evening about 5 PM followed by multiple episodes of vomiting beginning about 7 PM.  He denies associated diarrhea or abdominal pain (though he does have some epigastric pain when he vomits).  He feels like he is dehydrated.   Past Medical History:  Diagnosis Date  . Acute kidney injury ([place]) 04/13/2017   related to "dehydration" (04/14/2017)  . Gastroparesis     History reviewed. No pertinent surgical history.  Family History  Problem Relation Age of Onset  . CAD Mother   . Diabetes Neg Hx   . Stroke Neg Hx     Social History   Tobacco Use  . Smoking status: Never Smoker  . Smokeless tobacco: Never Used  Substance Use Topics  . 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 Drug use: Not Currently    Types: Marijuana    Comment: last used 1 month ago    Prior to Admission medications   Medication Sig Start Date End Date Taking? Authorizing Provider  acetaminophen (TYLENOL) 325 MG tablet Take 650 mg by mouth every 6 (six) hours as needed for mild pain or headache.   Yes [provider]  ondansetron (ZOFRAN ODT) 4 MG disintegrating tablet Take 1 tablet (4 mg total) by mouth every 8 (eight) hours as needed for nausea or vomiting. 07/24/18  Yes Sherwood Gambler, MD  ondansetron (ZOFRAN) 8 MG tablet Take 1 tablet (8 mg total) by mouth every 8  (eight) hours as needed for nausea or vomiting. 09/08/18  Yes Daleen Bo, MD  promethazine (PHENERGAN) 25 MG tablet Take 1 tablet (25 mg total) by mouth every 6 (six) hours as needed for nausea or vomiting. 09/11/18  Yes Valarie Merino, MD  pantoprazole (PROTONIX) 20 MG tablet Take 1 tablet (20 mg total) by mouth daily. Patient not taking: Reported on 09/11/2018 07/24/18   Sherwood Gambler, MD  promethazine (PHENERGAN) 25 MG tablet Take 1 tablet (25 mg total) by mouth every 6 (six) hours as needed for nausea or vomiting. Patient not taking: Reported on 09/11/2018 07/24/18   Sherwood Gambler, MD    Allergies Patient has no known allergies.   REVIEW OF SYSTEMS  Negative except as noted here or in the History of Present Illness.   PHYSICAL EXAMINATION  Initial Vital Signs Blood pressure 104/76, pulse (!) 103, temperature 98 F (36.7 C), temperature source Oral, resp. rate 18, height 6\' 1"  (1.854 m), weight 49.9 kg, SpO2 100 %.  Examination General: Well-developed, well-nourished male in no acute distress; appearance consistent with age of record HENT: normocephalic; atraumatic Eyes: pupils equal, round and reactive to light; extraocular muscles intact Neck: supple Heart: regular rate and rhythm Lungs: clear to auscultation bilaterally Abdomen: soft; nondistended; nontender; no masses or hepatosplenomegaly; bowel sounds present Extremities: No deformity; full range of motion; pulses normal Neurologic: Awake, alert and  oriented; motor function intact in all extremities and symmetric; no facial droop Skin: Warm and dry Psychiatric: Normal mood and affect   RESULTS  Summary of this visit's results, reviewed by myself:   EKG Interpretation  Date/Time:    Ventricular Rate:    PR Interval:    QRS Duration:   QT Interval:    QTC Calculation:   R Axis:     Text Interpretation:        Laboratory Studies: Results for orders placed or performed during the hospital encounter of  09/19/18 (from the past 24 hour(s))  CBC with Differential/Platelet     Status: Abnormal   Collection Time: 09/19/18  5:35 AM  Result Value Ref Range   WBC 8.9 4.0 - 10.5 K/uL   RBC 4.63 4.22 - 5.81 MIL/uL   Hemoglobin 14.1 13.0 - 17.0 g/dL   HCT 41.7 39.0 - 52.0 %   MCV 90.1 80.0 - 100.0 fL   MCH 30.5 26.0 - 34.0 pg   MCHC 33.8 30.0 - 36.0 g/dL   RDW 12.0 11.5 - 15.5 %   Platelets 270 150 - 400 K/uL   nRBC 0.0 0.0 - 0.2 %   Neutrophils Relative % 34 %   Neutro Abs 3.0 1.7 - 7.7 K/uL   Lymphocytes Relative 56 %   Lymphs Abs 4.9 (H) 0.7 - 4.0 K/uL   Monocytes Relative 9 %   Monocytes Absolute 0.8 0.1 - 1.0 K/uL   Eosinophils Relative 1 %   Eosinophils Absolute 0.1 0.0 - 0.5 K/uL   Basophils Relative 0 %   Basophils Absolute 0.0 0.0 - 0.1 K/uL   Immature Granulocytes 0 %   Abs Immature Granulocytes 0.02 0.00 - 0.07 K/uL  Basic metabolic panel     Status: Abnormal   Collection Time: 09/19/18  5:35 AM  Result Value Ref Range   Sodium 139 135 - 145 mmol/L   Potassium 3.0 (L) 3.5 - 5.1 mmol/L   Chloride 102 98 - 111 mmol/L   CO2 24 22 - 32 mmol/L   Glucose, Bld 93 70 - 99 mg/dL   BUN 22 (H) 6 - 20 mg/dL   Creatinine, Ser 0.96 0.61 - 1.24 mg/dL   Calcium 9.1 8.9 - 10.3 mg/dL   GFR calc non Af Amer >60 >60 mL/min   GFR calc Af Amer >60 >60 mL/min   Anion gap 13 5 - 15   Imaging Studies: No results found.  ED COURSE and MDM  Nursing notes and initial vitals signs, including pulse oximetry, reviewed.  Vitals:   09/18/18 2314 09/19/18 0320 09/19/18 0540  BP: 105/84 104/76 (!) 124/92  Pulse: (!) 105 (!) 103 75  Resp: 18 18 17   Temp: 99 F (37.2 C) 98 F (36.7 C)   TempSrc: Oral Oral   SpO2: 100% 100% 99%  Weight: 49.9 kg    Height: 6\' 1"  (1.854 m)     6:50 AM Patient is feeling better after IV fluids and Phenergan.  He states he has adequate Phenergan at home.  PROCEDURES    ED DIAGNOSES     ICD-10-CM   1. Nausea and vomiting in adult R11.2   2. Hypokalemia  due to loss of potassium E87.6        Meegan Shanafelt, Jenny Reichmann, MD 09/19/18 [phone]

## 2018-09-26 ENCOUNTER — Encounter (HOSPITAL_COMMUNITY): Payer: Self-pay | Admitting: Emergency Medicine

## 2018-09-26 ENCOUNTER — Inpatient Hospital Stay (HOSPITAL_COMMUNITY)
Admission: EM | Admit: 2018-09-26 | Discharge: 2018-09-28 | DRG: 378 | Disposition: A | Discharge status: Home / Self Care | Payer: Self-pay | Attending: Internal Medicine | Admitting: Internal Medicine

## 2018-09-26 ENCOUNTER — Emergency Department (HOSPITAL_COMMUNITY): Payer: Self-pay

## 2018-09-26 ENCOUNTER — Other Ambulatory Visit: Payer: Self-pay

## 2018-09-26 DIAGNOSIS — F1290 Cannabis use, unspecified, uncomplicated: Secondary | ICD-10-CM | POA: Diagnosis present

## 2018-09-26 DIAGNOSIS — E860 Dehydration: Secondary | ICD-10-CM

## 2018-09-26 DIAGNOSIS — Z79899 Other long term (current) drug therapy: Secondary | ICD-10-CM

## 2018-09-26 DIAGNOSIS — N179 Acute kidney failure, unspecified: Secondary | ICD-10-CM | POA: Diagnosis present

## 2018-09-26 DIAGNOSIS — E876 Hypokalemia: Secondary | ICD-10-CM | POA: Diagnosis present

## 2018-09-26 DIAGNOSIS — E1143 Type 2 diabetes mellitus with diabetic autonomic (poly)neuropathy: Secondary | ICD-10-CM | POA: Diagnosis present

## 2018-09-26 DIAGNOSIS — K922 Gastrointestinal hemorrhage, unspecified: Secondary | ICD-10-CM | POA: Diagnosis present

## 2018-09-26 DIAGNOSIS — T407X5A Adverse effect of cannabis (derivatives), initial encounter: Secondary | ICD-10-CM | POA: Diagnosis present

## 2018-09-26 DIAGNOSIS — Z681 Body mass index (BMI) 19 or less, adult: Secondary | ICD-10-CM

## 2018-09-26 DIAGNOSIS — K3184 Gastroparesis: Secondary | ICD-10-CM | POA: Diagnosis present

## 2018-09-26 DIAGNOSIS — K209 Esophagitis, unspecified: Secondary | ICD-10-CM | POA: Diagnosis present

## 2018-09-26 DIAGNOSIS — D62 Acute posthemorrhagic anemia: Secondary | ICD-10-CM | POA: Diagnosis present

## 2018-09-26 DIAGNOSIS — I959 Hypotension, unspecified: Secondary | ICD-10-CM | POA: Diagnosis present

## 2018-09-26 DIAGNOSIS — E440 Moderate protein-calorie malnutrition: Secondary | ICD-10-CM | POA: Diagnosis present

## 2018-09-26 DIAGNOSIS — K920 Hematemesis: Principal | ICD-10-CM | POA: Diagnosis present

## 2018-09-26 DIAGNOSIS — R112 Nausea with vomiting, unspecified: Secondary | ICD-10-CM | POA: Diagnosis present

## 2018-09-26 DIAGNOSIS — E1165 Type 2 diabetes mellitus with hyperglycemia: Secondary | ICD-10-CM | POA: Diagnosis present

## 2018-09-26 HISTORY — DX: Type 2 diabetes mellitus without complications: E11.9

## 2018-09-26 LAB — RAPID URINE DRUG SCREEN, HOSP PERFORMED
Amphetamines: NOT DETECTED
Barbiturates: NOT DETECTED
Benzodiazepines: NOT DETECTED
Cocaine: NOT DETECTED
Opiates: NOT DETECTED
Tetrahydrocannabinol: POSITIVE — AB

## 2018-09-26 LAB — BASIC METABOLIC PANEL
ANION GAP: 6 (ref 5–15)
BUN: 36 mg/dL — ABNORMAL HIGH (ref 6–20)
CALCIUM: 7.7 mg/dL — AB (ref 8.9–10.3)
CO2: 20 mmol/L — ABNORMAL LOW (ref 22–32)
Chloride: 111 mmol/L (ref 98–111)
Creatinine, Ser: 1.07 mg/dL (ref 0.61–1.24)
GFR calc Af Amer: >60 mL/min (ref >60)
GFR calc non Af Amer: >60 mL/min (ref >60)
Glucose, Bld: 100 mg/dL — ABNORMAL HIGH (ref 70–99)
Potassium: 4.9 mmol/L (ref 3.5–5.1)
Sodium: 137 mmol/L (ref 135–145)

## 2018-09-26 LAB — I-STAT CHEM 8, ED
BUN: 37 mg/dL — ABNORMAL HIGH (ref 6–20)
Calcium, Ion: 1.03 mmol/L — ABNORMAL LOW (ref 1.15–1.40)
Chloride: 103 mmol/L (ref 98–111)
Creatinine, Ser: 1.6 mg/dL — ABNORMAL HIGH (ref 0.61–1.24)
Glucose, Bld: 276 mg/dL — ABNORMAL HIGH (ref 70–99)
HCT: 32 % — ABNORMAL LOW (ref 39.0–52.0)
Hemoglobin: 10.9 g/dL — ABNORMAL LOW (ref 13.0–17.0)
Potassium: 3.2 mmol/L — ABNORMAL LOW (ref 3.5–5.1)
Sodium: 135 mmol/L (ref 135–145)
TCO2: 17 mmol/L — ABNORMAL LOW (ref 22–32)

## 2018-09-26 LAB — PREPARE RBC (CROSSMATCH)

## 2018-09-26 LAB — COMPREHENSIVE METABOLIC PANEL
ALT: 12 U/L (ref 0–44)
AST: 23 U/L (ref 15–41)
Albumin: 3.4 g/dL — ABNORMAL LOW (ref 3.5–5.0)
Alkaline Phosphatase: 44 U/L (ref 38–126)
Anion gap: 17 — ABNORMAL HIGH (ref 5–15)
BUN: 39 mg/dL — ABNORMAL HIGH (ref 6–20)
CALCIUM: 8.6 mg/dL — AB (ref 8.9–10.3)
CO2: 16 mmol/L — ABNORMAL LOW (ref 22–32)
CREATININE: 1.97 mg/dL — AB (ref 0.61–1.24)
Chloride: 102 mmol/L (ref 98–111)
GFR calc Af Amer: 44 mL/min — ABNORMAL LOW (ref >60)
GFR calc non Af Amer: 38 mL/min — ABNORMAL LOW (ref >60)
Glucose, Bld: 290 mg/dL — ABNORMAL HIGH (ref 70–99)
Potassium: 3.2 mmol/L — ABNORMAL LOW (ref 3.5–5.1)
Sodium: 135 mmol/L (ref 135–145)
Total Bilirubin: 0.7 mg/dL (ref 0.3–1.2)
Total Protein: 6.3 g/dL — ABNORMAL LOW (ref 6.5–8.1)

## 2018-09-26 LAB — PROTIME-INR
INR: 1.26
Prothrombin Time: 15.7 seconds — ABNORMAL HIGH (ref 11.4–15.2)

## 2018-09-26 LAB — HEMOGLOBIN AND HEMATOCRIT, BLOOD
HCT: 22.2 % — ABNORMAL LOW (ref 39.0–52.0)
HCT: 18.1 % — ABNORMAL LOW (ref 39.0–52.0)
Hemoglobin: 7.4 g/dL — ABNORMAL LOW (ref 13.0–17.0)
Hemoglobin: 6.2 g/dL — CL (ref 13.0–17.0)

## 2018-09-26 LAB — HEMOGLOBIN A1C
Hgb A1c MFr Bld: 5.6 % (ref 4.8–5.6)
Mean Plasma Glucose: 114.02 mg/dL

## 2018-09-26 LAB — CBC
HCT: 30.1 % — ABNORMAL LOW (ref 39.0–52.0)
HCT: 22.4 % — ABNORMAL LOW (ref 39.0–52.0)
Hemoglobin: 10.1 g/dL — ABNORMAL LOW (ref 13.0–17.0)
Hemoglobin: 7.4 g/dL — ABNORMAL LOW (ref 13.0–17.0)
MCH: 30.8 pg (ref 26.0–34.0)
MCH: 30.7 pg (ref 26.0–34.0)
MCHC: 33.6 g/dL (ref 30.0–36.0)
MCHC: 33 g/dL (ref 30.0–36.0)
MCV: 91.8 fL (ref 80.0–100.0)
MCV: 92.9 fL (ref 80.0–100.0)
PLATELETS: 230 10*3/uL (ref 150–400)
Platelets: 200 10*3/uL (ref 150–400)
RBC: 3.28 MIL/uL — ABNORMAL LOW (ref 4.22–5.81)
RBC: 2.41 MIL/uL — ABNORMAL LOW (ref 4.22–5.81)
RDW: 12.5 % (ref 11.5–15.5)
RDW: 12.8 % (ref 11.5–15.5)
WBC: 9.5 10*3/uL (ref 4.0–10.5)
WBC: 12.1 10*3/uL — ABNORMAL HIGH (ref 4.0–10.5)
nRBC: 0 % (ref 0.0–0.2)
nRBC: 0 % (ref 0.0–0.2)

## 2018-09-26 LAB — CBG MONITORING, ED
GLUCOSE-CAPILLARY: 85 mg/dL (ref 70–99)
Glucose-Capillary: 93 mg/dL (ref 70–99)

## 2018-09-26 LAB — GLUCOSE, CAPILLARY: Glucose-Capillary: 102 mg/dL — ABNORMAL HIGH (ref 70–99)

## 2018-09-26 LAB — MRSA PCR SCREENING: MRSA by PCR: NEGATIVE

## 2018-09-26 LAB — HIV ANTIBODY (ROUTINE TESTING W REFLEX): HIV Screen 4th Generation wRfx: NONREACTIVE

## 2018-09-26 LAB — ABO/RH: ABO/RH(D): A NEG

## 2018-09-26 MED ORDER — ALPRAZOLAM 0.25 MG PO TABS
0.2500 mg | ORAL_TABLET | Freq: Three times a day (TID) | ORAL | Status: DC | PRN
  Administered 2018-09-26 – 2018-09-27 (×3): 0.25 mg via ORAL
  Filled 2018-09-26 (×3): qty 1

## 2018-09-26 MED ORDER — PANTOPRAZOLE SODIUM 40 MG IV SOLR: 40.0000 mg | Freq: Two times a day (BID) | INTRAVENOUS | Status: DC

## 2018-09-26 MED ORDER — POTASSIUM CHLORIDE CRYS ER 20 MEQ PO TBCR
40.0000 meq | EXTENDED_RELEASE_TABLET | Freq: Once | ORAL | Status: AC
  Administered 2018-09-26: 40 meq via ORAL
  Filled 2018-09-26: qty 2

## 2018-09-26 MED ORDER — ACETAMINOPHEN 650 MG RE SUPP: 650.0000 mg | Freq: Four times a day (QID) | RECTAL | Status: DC | PRN

## 2018-09-26 MED ORDER — PANTOPRAZOLE SODIUM 40 MG IV SOLR
40.0000 mg | Freq: Once | INTRAVENOUS | Status: AC
  Administered 2018-09-26: 40 mg via INTRAVENOUS
  Filled 2018-09-26: qty 40

## 2018-09-26 MED ORDER — ONDANSETRON HCL 4 MG/2ML IJ SOLN: 4.0000 mg | Freq: Four times a day (QID) | INTRAMUSCULAR | Status: DC | PRN

## 2018-09-26 MED ORDER — ACETAMINOPHEN 325 MG PO TABS: 650.0000 mg | ORAL_TABLET | Freq: Four times a day (QID) | ORAL | Status: DC | PRN

## 2018-09-26 MED ORDER — SODIUM CHLORIDE 0.9 % IV SOLN
8.0000 mg/h | INTRAVENOUS | Status: DC
  Administered 2018-09-26 – 2018-09-28 (×5): 8 mg/h via INTRAVENOUS
  Filled 2018-09-26 (×7): qty 80

## 2018-09-26 MED ORDER — SODIUM CHLORIDE 0.9 % IV SOLN
INTRAVENOUS | Status: DC
  Administered 2018-09-27: 05:00:00 via INTRAVENOUS

## 2018-09-26 MED ORDER — SODIUM CHLORIDE 0.9 % IV BOLUS (SEPSIS)
1000.0000 mL | Freq: Once | INTRAVENOUS | Status: AC
  Administered 2018-09-26: 1000 mL via INTRAVENOUS

## 2018-09-26 MED ORDER — SODIUM CHLORIDE 0.9 % IV SOLN
INTRAVENOUS | Status: DC
  Administered 2018-09-26 (×3): via INTRAVENOUS

## 2018-09-26 MED ORDER — ONDANSETRON HCL 4 MG PO TABS: 4.0000 mg | ORAL_TABLET | Freq: Four times a day (QID) | ORAL | Status: DC | PRN

## 2018-09-26 MED ORDER — SODIUM CHLORIDE 0.9% IV SOLUTION
Freq: Once | INTRAVENOUS | Status: AC
  Administered 2018-09-26: 23:00:00 via INTRAVENOUS

## 2018-09-26 MED ORDER — INSULIN ASPART 100 UNIT/ML ~~LOC~~ SOLN: 0.0000 [IU] | SUBCUTANEOUS | Status: DC

## 2018-09-26 MED ORDER — LACTATED RINGERS IV BOLUS
1000.0000 mL | Freq: Once | INTRAVENOUS | Status: AC
  Administered 2018-09-26: 1000 mL via INTRAVENOUS

## 2018-09-26 NOTE — ED Notes (Signed)
Attempted report x1. 

## 2018-09-26 NOTE — Progress Notes (Signed)
Seen and examined this morning. I have reviewed admission note and plan ordered today this morning and agree with the H&P notes with following addendum:  53 year old male with history of recurrent nausea vomiting for several years diagnosed as gastroparesis or cannabinoid hyperemesis syndrome or cyclical vomiting syndrome, who was seen in the ER multiple times for similar issues, recent on 09/20/2019 when he was discharged on antiemetics. He was doing well at home until he started having sudden episode of vomiting with hematemesis.  He also noted melanotic stool past day.  He denies any abdominal pain, chest pain, fever, chills.  No prior EGD or colonoscopy documented.  Patient was seen this morning, was resting comfortably, heart rate in 110, blood pressure in 83J 825K systolic.  Denies any abdominal pain and on exam was nontender, was alert awake and not in acute distress.  Hematemesis and melena, suspecting upper GI bleeding in the setting of cyclical vomiting, query Mallory-Weiss tear: Continue n.p.o., IV fluids, PPI.  I have discussed with GI this morning for consult.  Still has tachycardia blood pressure soft, we will continue on IV fluid hydration.  Continue to wide bore IV line, type and screen.  Acute blood loss anemia: Baseline hemoglobin around 13 to 15 g past few months, on admission in 10 gm, repeat is 7.4 gm, but patient did receive aggressive normal saline at least 2 L in the ER.  Denies any vomiting or bowel movement in the ER. Cont to monitor H&H every 6 hours and transfuse if less than 7 g.   Cyclical vomiting, urine drug screen pending.cont supportive care.  AKI creatinine at 1.9 from baseline 0.8-0.9, likely prerenal/GI bleed and volume loss. AKI resolved w ivf. Hypokalemia repleted Hyperglycemia A1c normal.  DC sliding scale.

## 2018-09-26 NOTE — Consult Note (Addendum)
[facility] Dr. Benson Norway and Dr. Collene Mares  HPI Mr. 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 EMS reported approximately 549ml of bloody vomit that they appreciated. Patient states that he noted some brown colored vomitus 2 days again and subsequently the bloody vomitus morning of admission (09/26/18). He has not had any abdominal discomfort, dizziness, nausea, vomiting.  Patient has had numerous ED visitis for recurrent nausea and vomiting several times a year since 2013 that was thought to be secondary to gastroparesis vs hyperemesis syndrome. He had an ED visit for epigastric abdominal pain in January 2019 that was thought to be due to acute gastritis without hemorrhage and was treated with protonix and carafate and was recommended to follow up with pcp. His most recent visit was on 09/19/18 when he was treated with ivf and antiemetics for presumed gastroenteritis.  The patient does not have any documented egd or colonoscopy on file. He denies having any prior episodes of hematemesis.  ROS  Review of Systems  Constitutional: Negative for chills and fever.  Cardiovascular: Negative for chest pain.  Gastrointestinal: Negative for abdominal pain, diarrhea, nausea and vomiting.  Neurological: Negative for dizziness.   Physical Exam Vitals:   09/26/18 1300 09/26/18 1315  BP: 109/66 111/72  Pulse: 90 95  Resp: 13 17  Temp:    SpO2: 99% 100%   Physical Exam  Constitutional: He appears well-developed and well-nourished. No distress.  HENT:  Head: Normocephalic and atraumatic.  Eyes: Conjunctivae are normal.  Cardiovascular: Normal rate, regular rhythm and normal heart sounds.  Respiratory: Effort normal and breath sounds normal. No respiratory distress. He has no wheezes.  GI: Soft. Bowel sounds are normal. He  exhibits no distension.  Musculoskeletal:        General: No edema.  Neurological: He is alert.  Skin: He is not diaphoretic. No erythema.  No pallor appreciated  Psychiatric: He has a normal mood and affect. His behavior is normal. Judgment and thought content normal.    Assessment and plan   Mr. Narang is a 53 y.o m with known history of recurrent nausea and vomiting who presented with 549ml of hyperemesis.   Upper GI bleed  Blood loss anemia The patient has been having soft blood pressures (sbp 90-110s) and has been tachycardic (100-120s). The patient has received cumulative 2L of IVF since admit.  On admission, his hb=10 from a baseline of 14-15, and it further dropped to 7.4 this morning 09/26/18. Patient has not had any blood loss since admission.  Per CT abdomen 2018 did not show any AAA at that time and bedside ultrasound did not show any AAA. The patient is not on any blood thinners at home.   Patient is high risk for mallory weiss tear due to repeated chronic history of vomiting. Will schedule for EGD 09/27/18   -Type and screen completed -transfuse to keep hb>7 -ordered utox to check for marijuana -LR bolus 1L  -Continue maintenance fluids -Continue IV protonix drip -continue h&h q6hrs  AKI Cr=1.97, up from baseline 0.8-0.9. Likely pre-renal in etiology due to volume loss.    Diabetes Mellitus The patient has an A1c=5.6. He has not been on any antihyperglycemic agents at home.  -Continue ssi sensitive  Lars Mage, MD Internal Medicine PGY2 [phone] 09/26/2018, 3:18 PM   ATTENDING NOTE.  The patient was evaluated with the resident.  This  is a 53 year old male with a PMH of recurrent nausea, vomiting, and hematemesis.  The bleeding was significant and EMS reported 500 ml of blood.  He was hemodynamically stable, but his HGB did drop down significantly from a baselin of 13-14 g/dL.  The vomiting has been an ongoing issue for some years.  He will have  an episode of his symptoms and then he will need to present to the ER.  IV fluids help with his symptoms and then he is discharged home.  The patient is well for 3-4 months and then he has another cycle of his symptoms.  He does specifically state that very hot showers help his symptoms.  There is 40 year history of smoking marijuana, but he has not smoke for the past 30 days.  However, his son and wife both smoke marijuana.  The patient's symptoms are consistent with CHS.  In fact, he was counseled about the issue in the past, but he did not listen to the advice.  Now he feels that he can live a life free of marijuana.  He was told that second hand marijuana smoke exposes him to [facility].  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  Plan: 1) EGD. 2) Follow HGB and transfuse as necessary. 3) Check UTOX for THC. 4) Stop smoking marijuana.

## 2018-09-26 NOTE — ED Provider Notes (Signed)
[place] EMERGENCY DEPARTMENT Provider Note   CSN: [id number] Arrival date & time: 09/26/18  0050     History   Chief Complaint Chief Complaint  Patient presents with  . GI Bleeding   Level 5 caveat due to acuity of condition HPI Lee Greer is a 53 y.o. male.  The history is provided by the patient and the EMS personnel. The history is limited by the condition of the patient.  Emesis  Severity:  Severe Quality:  Bright red blood Progression:  Worsening Chronicity:  New Relieved by:  Nothing Worsened by:  Nothing Associated symptoms: no fever and no headaches    Patient with history of gastroparesis presents with vomiting.  He reports vomiting a large amount of blood.  Also reports blood in stool.  He had some abdominal burning that is improved. Denies chest pain.  No headache.  He reports he feels cold. Past Medical History:  Diagnosis Date  . Acute kidney injury ([place]) 04/13/2017   related to "dehydration" (04/14/2017)  . Gastroparesis     Patient Active Problem List   Diagnosis Date Noted  . AKI (acute kidney injury) ([place]) 04/13/2017  . Dehydration 04/13/2017  . Nausea with vomiting 02/17/2015    History reviewed. No pertinent surgical history.      Home Medications    Prior to Admission medications   Medication Sig Start Date End Date Taking? Authorizing Provider  acetaminophen (TYLENOL) 325 MG tablet Take 650 mg by mouth every 6 (six) hours as needed for mild pain or headache.    [provider]  ondansetron (ZOFRAN ODT) 4 MG disintegrating tablet Take 1 tablet (4 mg total) by mouth every 8 (eight) hours as needed for nausea or vomiting. 07/24/18   Sherwood Gambler, MD  ondansetron (ZOFRAN) 8 MG tablet Take 1 tablet (8 mg total) by mouth every 8 (eight) hours as needed for nausea or vomiting. 09/08/18   Daleen Bo, MD  potassium chloride (K-DUR) 10 MEQ tablet Take 1 tablet (10 mEq total) by mouth daily. 09/19/18   Molpus,  John, MD  promethazine (PHENERGAN) 25 MG tablet Take 1 tablet (25 mg total) by mouth every 6 (six) hours as needed for nausea or vomiting. 09/11/18   Valarie Merino, MD    Family History Family History  Problem Relation Age of Onset  . CAD Mother   . Diabetes Neg Hx   . Stroke Neg Hx     Social History Social History   Tobacco Use  . Smoking status: Never Smoker  . Smokeless tobacco: Never Used  Substance Use Topics  . 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 Drug use: Not Currently    Types: Marijuana    Comment: last used 1 month ago     Allergies   Patient has no known allergies.   Review of Systems Review of Systems  Unable to perform ROS: Acuity of condition  Constitutional: Negative for fever.  Gastrointestinal: Positive for vomiting.  Neurological: Negative for headaches.     Physical Exam Updated Vital Signs BP (!) 94/46   Temp 99.6 F (37.6 C) (Rectal)   Resp 16   Ht 1.854 m (6\' 1" )   Wt 57.6 kg   BMI 16.76 kg/m   Physical Exam CONSTITUTIONAL: Ill-appearing HEAD: Normocephalic/atraumatic EYES: EOMI/PERRL, conjunctival pale ENMT: Mucous membranes dry NECK: supple no meningeal signs SPINE/BACK:entire spine nontender CV: S1/S2 noted, tachycardic LUNGS: Lungs are clear to auscultation bilaterally, no apparent  distress mild tachypnea ABDOMEN: soft, nontender, no rebound or guarding, bowel sounds noted throughout abdomen, pulsation of aorta noted due to patient being very thin, no palpable mass noted GU:no cva tenderness Stool without blood or melena NEURO: Pt is awake/alert/appropriate, moves all extremitiesx4.  No facial droop.   EXTREMITIES: pulses normal/equal, full ROM SKIN: warm, color normal PSYCH: Anxious   ED Treatments / Results  Labs (all labs ordered are listed, but only abnormal results are displayed) Labs Reviewed  COMPREHENSIVE METABOLIC PANEL - Abnormal; Notable for the following components:      Result  Value   Potassium 3.2 (*)    CO2 16 (*)    Glucose, Bld 290 (*)    BUN 39 (*)    Creatinine, Ser 1.97 (*)    Calcium 8.6 (*)    Total Protein 6.3 (*)    Albumin 3.4 (*)    GFR calc non Af Amer 38 (*)    GFR calc Af Amer 44 (*)    Anion gap 17 (*)    All other components within normal limits  CBC - Abnormal; Notable for the following components:   RBC 3.28 (*)    Hemoglobin 10.1 (*)    HCT 30.1 (*)    All other components within normal limits  PROTIME-INR - Abnormal; Notable for the following components:   Prothrombin Time 15.7 (*)    All other components within normal limits  I-STAT CHEM 8, ED - Abnormal; Notable for the following components:   Potassium 3.2 (*)    BUN 37 (*)    Creatinine, Ser 1.60 (*)    Glucose, Bld 276 (*)    Calcium, Ion 1.03 (*)    TCO2 17 (*)    Hemoglobin 10.9 (*)    HCT 32.0 (*)    All other components within normal limits  RAPID URINE DRUG SCREEN, HOSP PERFORMED  HIV ANTIBODY (ROUTINE TESTING W REFLEX)  TYPE AND SCREEN  ABO/RH    EKG EKG Interpretation  Date/Time:  Tuesday September 26 2018 00:55:23 EST Ventricular Rate:  129 PR Interval:    QRS Duration: 90 QT Interval:  346 QTC Calculation: 507 R Axis:   85 Text Interpretation:  Sinus tachycardia Consider right atrial enlargement RSR' in V1 or V2, probably normal variant Minimal ST elevation, lateral leads Prolonged QT interval Confirmed by Ripley Fraise [phone]) on 09/26/2018 1:11:15 AM   Radiology Dg Chest Port 1 View  Result Date: 09/26/2018 CLINICAL DATA:  Weakness EXAM: PORTABLE CHEST 1 VIEW COMPARISON:  09/16/2017 FINDINGS: Hyperinflated lungs. No focal airspace disease or effusion. Normal heart size. No pneumothorax. IMPRESSION: Hyperinflation without acute airspace disease Electronically Signed   By: Donavan Foil M.D.   On: 09/26/2018 01:18    Procedures Procedures  CRITICAL CARE Performed by: Sharyon Cable Total critical care time: 45 minutes Critical care time was  exclusive of separately billable procedures and treating other patients. Critical care was necessary to treat or prevent imminent or life-threatening deterioration. Critical care was time spent personally by me on the following activities: development of treatment plan with patient and/or surrogate as well as nursing, discussions with consultants, evaluation of patient's response to treatment, examination of patient, obtaining history from patient or surrogate, ordering and performing treatments and interventions, ordering and review of laboratory studies, ordering and review of radiographic studies, pulse oximetry and re-evaluation of patient's condition.   Medications Ordered in ED Medications  pantoprazole (PROTONIX) 80 mg in sodium chloride 0.9 % 250 mL (0.32 mg/mL)  infusion (has no administration in time range)  pantoprazole (PROTONIX) injection 40 mg (has no administration in time range)  pantoprazole (PROTONIX) injection 40 mg (has no administration in time range)  acetaminophen (TYLENOL) tablet 650 mg (has no administration in time range)    Or  acetaminophen (TYLENOL) suppository 650 mg (has no administration in time range)  ondansetron (ZOFRAN) tablet 4 mg (has no administration in time range)    Or  ondansetron (ZOFRAN) injection 4 mg (has no administration in time range)  0.9 %  sodium chloride infusion (has no administration in time range)  sodium chloride 0.9 % bolus 1,000 mL (0 mLs Intravenous Stopped 09/26/18 0221)  sodium chloride 0.9 % bolus 1,000 mL (0 mLs Intravenous Stopped 09/26/18 0221)  pantoprazole (PROTONIX) injection 40 mg (40 mg Intravenous Given 09/26/18 0221)     Initial Impression / Assessment and Plan / ED Course  I have reviewed the triage vital signs and the nursing notes.  Pertinent labs & imaging results that were available during my care of the patient were reviewed by me and considered in my medical decision making (see chart for details).     1:14  AM He presented via EMS for vomiting blood.  EMS thought that the patient had a AAA on exam.  However he had CT imaging a year ago that did not reveal AAA, and here he only has a prominent aorta felt on exam, but is not pulsatile mass.  Patient is very thin, only weighing 57 kg Limited bedside ultrasound does not reveal an obvious AAA. However he does report vomiting blood, GI bleed is high possibility. Follow closely 1:53 AM Patient feels improved.  He denies any abdominal/back pain.  Heart rate is in 120s, blood pressures in the 120s. No focal abdominal tenderness. Son has arrived to confirm the patient vomited large amount of blood.  Patient also reports black stool recently Suspect acute GI bleed. Start Protonix.  No indication for imaging at this time as he is in no pain 3:11 AM Blood pressure (!) 107/92, pulse (!) 121, temperature 99.6 F (37.6 C), temperature source Rectal, resp. rate 17, height 1.854 m (6\' 1" ), weight 57.6 kg, SpO2 96 %. Patient remained stable.  No further vomiting episodes.  He is responded to IV fluids. He denies any abdominal pain. Discussed with Dr. Alcario Drought for admission  Defer GI consult for the morning as patient is not actively hemorrhaging at this time. Final Clinical Impressions(s) / ED Diagnoses   Final diagnoses:  Acute upper GI bleed  Dehydration  AKI (acute kidney injury) [facility])    ED Discharge Orders    None       Ripley Fraise, MD 09/26/18 [phone]

## 2018-09-26 NOTE — ED Notes (Signed)
ED Provider at bedside. 

## 2018-09-26 NOTE — ED Triage Notes (Signed)
Pt arrived [facility] from home for reports of vomiting blood, EMS reports approx 541mL of blood vomited. 20G RAC, vitals with EMS 64/palp 81%NRB pulse 130s. Ems reports "palpable abdominal mass" not seen on assessment

## 2018-09-26 NOTE — Plan of Care (Signed)
  Problem: Education: Goal: Knowledge of General Education information will improve Description: Including pain rating scale, medication(s)/side effects and non-pharmacologic comfort measures Outcome: Progressing   Problem: Clinical Measurements: Goal: Ability to maintain clinical measurements within normal limits will improve Outcome: Progressing   

## 2018-09-26 NOTE — H&P (Signed)
History and Physical    Lee Greer UVO:[id number] DOB: 08-21-1966 DOA: 09/26/2018  PCP: Gildardo Pounds, NP  Patient coming from: Home  I have personally briefly reviewed patient's old medical records in [place]  Chief Complaint: Hematemesis  HPI: Lee Greer is a 53 y.o. male with medical history significant of recurrent N/V for several years, diagnosed variously as gastroparesis or canabanoid hyperemesis syndrome.  Patient has had N/V this past week and was seen by Dr. Florina Ou in ED x7 days ago, treated with IVF, anti nausea meds and released.  Today he had episode of emesis of bright red blood.  Had also had melanotic stools the past day or two he reports.  EMS called after hematemesis episode.   ED Course: Initially tachy to 120s and hypotensive with EMS to the 60s.  BP improved to 742V systolic after IVF though patient remains tachycardic to 120s.  HGB 10.1 down from 14.1 last week.  Creat 1.97 up from 0.96 last week.   Review of Systems: As per HPI otherwise 10 point review of systems negative.   Past Medical History:  Diagnosis Date  . Acute kidney injury ([place]) 04/13/2017   related to "dehydration" (04/14/2017)  . Gastroparesis     History reviewed. No pertinent surgical history.   reports that he has never smoked. He has never used smokeless tobacco. He reports previous drug use. Drug: Marijuana. He reports that he does not drink alcohol.  No Known Allergies  Family History  Problem Relation Age of Onset  . CAD Mother   . Diabetes Neg Hx   . Stroke Neg Hx      Prior to Admission medications   Medication Sig Start Date End Date Taking? Authorizing Provider  acetaminophen (TYLENOL) 325 MG tablet Take 650 mg by mouth every 6 (six) hours as needed for mild pain or headache.   Yes [provider]  ondansetron (ZOFRAN) 8 MG tablet Take 1 tablet (8 mg total) by mouth every 8 (eight) hours as needed for nausea or vomiting. 09/08/18  Yes  Daleen Bo, MD  potassium chloride (K-DUR) 10 MEQ tablet Take 1 tablet (10 mEq total) by mouth daily. 09/19/18  Yes Molpus, John, MD  promethazine (PHENERGAN) 25 MG tablet Take 1 tablet (25 mg total) by mouth every 6 (six) hours as needed for nausea or vomiting. 09/11/18  Yes Valarie Merino, MD  ondansetron (ZOFRAN ODT) 4 MG disintegrating tablet Take 1 tablet (4 mg total) by mouth every 8 (eight) hours as needed for nausea or vomiting. Patient not taking: Reported on 09/26/2018 07/24/18   Sherwood Gambler, MD    Physical Exam: Vitals:   09/26/18 0240 09/26/18 0300 09/26/18 0305 09/26/18 0315  BP: 113/80 99/72 (!) 107/92 97/69  Pulse: (!) 112 (!) 119 (!) 121 (!) 122  Resp: 18 13 17  (!) 24  Temp:      TempSrc:      SpO2: 99% 99% 96% 98%  Weight:      Height:        Constitutional: NAD, calm, comfortable Eyes: PERRL, lids and conjunctivae normal ENMT: Mucous membranes are moist. Posterior pharynx clear of any exudate or lesions.Normal dentition.  Neck: normal, supple, no masses, no thyromegaly Respiratory: clear to auscultation bilaterally, no wheezing, no crackles. Normal respiratory effort. No accessory muscle use.  Cardiovascular: Regular rate and rhythm, no murmurs / rubs / gallops. No extremity edema. 2+ pedal pulses. No carotid bruits.  Abdomen: no tenderness, no masses palpated. No  hepatosplenomegaly. Bowel sounds positive.  Musculoskeletal: no clubbing / cyanosis. No joint deformity upper and lower extremities. Good ROM, no contractures. Normal muscle tone.  Skin: no rashes, lesions, ulcers. No induration Neurologic: CN 2-12 grossly intact. Sensation intact, DTR normal. Strength 5/5 in all 4.  Psychiatric: Normal judgment and insight. Alert and oriented x 3. Normal mood.    Labs on Admission: I have personally reviewed following labs and imaging studies  CBC: Recent Labs  Lab 09/19/18 0535 09/26/18 0111 09/26/18 0112  WBC 8.9 9.5  --   NEUTROABS 3.0  --   --   HGB  14.1 10.1* 10.9*  HCT 41.7 30.1* 32.0*  MCV 90.1 91.8  --   PLT 270 230  --    Basic Metabolic Panel: Recent Labs  Lab 09/19/18 0535 09/26/18 0111 09/26/18 0112  NA 139 135 135  K 3.0* 3.2* 3.2*  CL 102 102 103  CO2 24 16*  --   GLUCOSE 93 290* 276*  BUN 22* 39* 37*  CREATININE 0.96 1.97* 1.60*  CALCIUM 9.1 8.6*  --    GFR: Estimated Creatinine Clearance: 44.5 mL/min (A) (by C-G formula based on SCr of 1.6 mg/dL (H)). Liver Function Tests: Recent Labs  Lab 09/26/18 0111  AST 23  ALT 12  ALKPHOS 44  BILITOT 0.7  PROT 6.3*  ALBUMIN 3.4*   No results for input(s): LIPASE, AMYLASE in the last 168 hours. No results for input(s): AMMONIA in the last 168 hours. Coagulation Profile: Recent Labs  Lab 09/26/18 0111  INR 1.26   Cardiac Enzymes: No results for input(s): CKTOTAL, CKMB, CKMBINDEX, TROPONINI in the last 168 hours. BNP (last 3 results) No results for input(s): PROBNP in the last 8760 hours. HbA1C: No results for input(s): HGBA1C in the last 72 hours. CBG: No results for input(s): GLUCAP in the last 168 hours. Lipid Profile: No results for input(s): CHOL, HDL, LDLCALC, TRIG, CHOLHDL, LDLDIRECT in the last 72 hours. Thyroid Function Tests: No results for input(s): TSH, T4TOTAL, FREET4, T3FREE, THYROIDAB in the last 72 hours. Anemia Panel: No results for input(s): VITAMINB12, FOLATE, FERRITIN, TIBC, IRON, RETICCTPCT in the last 72 hours. Urine analysis:    Component Value Date/Time   COLORURINE YELLOW 09/08/2018 1102   APPEARANCEUR CLEAR 09/08/2018 1102   LABSPEC 1.018 09/08/2018 1102   PHURINE 6.0 09/08/2018 1102   GLUCOSEU 50 (A) 09/08/2018 1102   HGBUR MODERATE (A) 09/08/2018 1102   BILIRUBINUR NEGATIVE 09/08/2018 1102   BILIRUBINUR small 02/17/2015 1222   KETONESUR 5 (A) 09/08/2018 1102   PROTEINUR NEGATIVE 09/08/2018 1102   UROBILINOGEN 0.2 02/17/2015 1222   UROBILINOGEN 0.2 08/30/2014 0735   NITRITE NEGATIVE 09/08/2018 1102   LEUKOCYTESUR  NEGATIVE 09/08/2018 1102    Radiological Exams on Admission: Dg Chest Port 1 View  Result Date: 09/26/2018 CLINICAL DATA:  Weakness EXAM: PORTABLE CHEST 1 VIEW COMPARISON:  09/16/2017 FINDINGS: Hyperinflated lungs. No focal airspace disease or effusion. Normal heart size. No pneumothorax. IMPRESSION: Hyperinflation without acute airspace disease Electronically Signed   By: Donavan Foil M.D.   On: 09/26/2018 01:18    EKG: Independently reviewed.  Assessment/Plan Principal Problem:   Hematemesis Active Problems:   Nausea with vomiting   AKI (acute kidney injury) ([place])   UGIB (upper gastrointestinal bleed)    1. Hematemesis, UGIB - 1. Given cyclic vomiting including vomiting this past week, suspicious for mallory-weis. 2. NPO 3. IVF: NS at 125 4. Tele monitor 5. Repeat CBC at 0800 6. GI consult in AM  for likely EGD 2. AKI - 1. Likely pre-renal due to initial hypotension 2. IVF 3. Strict intake and output 4. Repeat BMP at 0800 3. Hypokalemia - replace 4. 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 UDS pending though he told Dr. Florina Ou no cannabis in past 1 month when he was seen last week. 5. Hyperglycemia - 1. Sensitive SSI Q4H 2. Check A1C  DVT prophylaxis: SCDs Code Status: Full Family Communication: No family in room Disposition Plan: Home after admit Consults called: None, call GI in am Admission status: Admit to inpatient  Severity of Illness: The appropriate patient status for this patient is INPATIENT. Inpatient status is judged to be reasonable and necessary in order to provide the required intensity of service to ensure the patient's safety. The patient's presenting symptoms, physical exam findings, and initial radiographic and laboratory data in the context of their chronic comorbidities is felt to place them at high risk for further clinical deterioration. Furthermore, it is not anticipated that the patient will be medically stable  for discharge from the hospital within 2 midnights of admission. The following factors support the patient status of inpatient.   " The patient's presenting symptoms include Hematemesis. " The worrisome physical exam findings include tachycardia, hypotension, HGB drop.   * I certify that at the point of admission it is my clinical judgment that the patient will require inpatient hospital care spanning beyond 2 midnights from the point of admission due to high intensity of service, high risk for further deterioration and high frequency of surveillance required.Etta Quill DO [facility] Pager [phone] Only works nights!  If 7AM-7PM, please contact the primary day team physician taking care of patient  [email or web address] Password [facility]  09/26/2018, 3:26 AM

## 2018-09-26 NOTE — ED Provider Notes (Signed)
Still waiting bed.  He reports feeling improved, no vomiting, no blood loss, no abdominal pain.  He is still tachycardic but overall improved awaiting admission    Ripley Fraise, MD 09/26/18 [phone]

## 2018-09-26 NOTE — ED Notes (Addendum)
Attempted report x2  Was told by Lee Greer they did not have a room yet  And would call when they decided where to place pt  This RN informed

## 2018-09-27 ENCOUNTER — Encounter (HOSPITAL_COMMUNITY): Payer: Self-pay | Admitting: Gastroenterology

## 2018-09-27 ENCOUNTER — Encounter (HOSPITAL_COMMUNITY)
Admission: EM | Disposition: A | Discharge status: Home / Self Care | Payer: Self-pay | Source: Home / Self Care | Attending: Internal Medicine

## 2018-09-27 ENCOUNTER — Inpatient Hospital Stay (HOSPITAL_COMMUNITY): Payer: Self-pay | Admitting: Anesthesiology

## 2018-09-27 DIAGNOSIS — D62 Acute posthemorrhagic anemia: Secondary | ICD-10-CM

## 2018-09-27 HISTORY — PX: ESOPHAGOGASTRODUODENOSCOPY (EGD) WITH PROPOFOL: SHX5813

## 2018-09-27 LAB — CBC
HEMATOCRIT: 26.3 % — AB (ref 39.0–52.0)
Hemoglobin: 8.9 g/dL — ABNORMAL LOW (ref 13.0–17.0)
MCH: 30.1 pg (ref 26.0–34.0)
MCHC: 33.8 g/dL (ref 30.0–36.0)
MCV: 88.9 fL (ref 80.0–100.0)
Platelets: 165 10*3/uL (ref 150–400)
RBC: 2.96 MIL/uL — ABNORMAL LOW (ref 4.22–5.81)
RDW: 13.6 % (ref 11.5–15.5)
WBC: 12.8 10*3/uL — ABNORMAL HIGH (ref 4.0–10.5)
nRBC: 0 % (ref 0.0–0.2)

## 2018-09-27 LAB — GLUCOSE, CAPILLARY
Glucose-Capillary: 86 mg/dL (ref 70–99)
Glucose-Capillary: 81 mg/dL (ref 70–99)

## 2018-09-27 LAB — HEMOGLOBIN AND HEMATOCRIT, BLOOD
HCT: 25.3 % — ABNORMAL LOW (ref 39.0–52.0)
Hemoglobin: 8.6 g/dL — ABNORMAL LOW (ref 13.0–17.0)

## 2018-09-27 SURGERY — ESOPHAGOGASTRODUODENOSCOPY (EGD) WITH PROPOFOL
Anesthesia: Monitor Anesthesia Care

## 2018-09-27 MED ORDER — LACTATED RINGERS IV SOLN
INTRAVENOUS | Status: DC | PRN
  Administered 2018-09-27: 14:00:00 via INTRAVENOUS

## 2018-09-27 MED ORDER — PROPOFOL 500 MG/50ML IV EMUL
INTRAVENOUS | Status: DC | PRN
  Administered 2018-09-27: 100 ug/kg/min via INTRAVENOUS

## 2018-09-27 MED ORDER — ONDANSETRON HCL 4 MG/2ML IJ SOLN
INTRAMUSCULAR | Status: DC | PRN
  Administered 2018-09-27: 4 mg via INTRAVENOUS

## 2018-09-27 MED ORDER — ZOLPIDEM TARTRATE 5 MG PO TABS
5.0000 mg | ORAL_TABLET | Freq: Once | ORAL | Status: AC
  Administered 2018-09-27: 5 mg via ORAL
  Filled 2018-09-27: qty 1

## 2018-09-27 MED ORDER — ADULT MULTIVITAMIN W/MINERALS CH
1.0000 | ORAL_TABLET | Freq: Every day | ORAL | Status: DC
  Administered 2018-09-28: 1 via ORAL
  Filled 2018-09-27: qty 1

## 2018-09-27 MED ORDER — LACTATED RINGERS IV SOLN
INTRAVENOUS | Status: AC | PRN
  Administered 2018-09-27: 1000 mL via INTRAVENOUS

## 2018-09-27 MED ORDER — PROPOFOL 10 MG/ML IV BOLUS
INTRAVENOUS | Status: DC | PRN
  Administered 2018-09-27: 30 mg via INTRAVENOUS

## 2018-09-27 SURGICAL SUPPLY — 15 items

## 2018-09-27 NOTE — Anesthesia Postprocedure Evaluation (Signed)
Anesthesia Post Note  Patient: JOEDY EICKHOFF  Procedure(s) Performed: ESOPHAGOGASTRODUODENOSCOPY (EGD) WITH PROPOFOL (N/A )     Patient location during evaluation: PACU Anesthesia Type: MAC Level of consciousness: awake and alert Pain management: pain level controlled Vital Signs Assessment: post-procedure vital signs reviewed and stable Respiratory status: spontaneous breathing, nonlabored ventilation, respiratory function stable and patient connected to nasal cannula oxygen Cardiovascular status: stable and blood pressure returned to baseline Postop Assessment: no apparent nausea or vomiting Anesthetic complications: no    Last Vitals:  Vitals:   09/27/18 1442 09/27/18 1450  BP: (!) 93/50 (!) 103/58  Pulse: 92 87  Resp: 12 15  Temp: 37 C   SpO2: 100% 100%    Last Pain:  Vitals:   09/27/18 1442  TempSrc: Oral  PainSc: 0-No pain                 Effie Berkshire

## 2018-09-27 NOTE — Progress Notes (Signed)
Pt requested MD speak with him concerning risks for EGD procedure before signing consent. Attempted to call endo 2 times with no response. Paged GI MD for pt to speak with.

## 2018-09-27 NOTE — Op Note (Addendum)
[facility] Patient Name: Lee Greer Procedure Date : 09/27/2018 MRN: [id number] Attending MD: Juanita Craver , MD Date of Birth: Jan 15, 1966 CSN: [id number] Age: 53 Admit Type: Inpatient Procedure:                Diagnostic EGD. Indications:              Acute post hemorrhagic anemia, Hematemesis, Nausea                            with vomiting Providers:                Juanita Craver, MD, Dorise Hiss, RN, Cherylynn Ridges,                            Technician, Eligha Bridegroom CRNA Referring MD:             THP Medicines:                Monitored Anesthesia Care Complications:            No immediate complications. Estimated Blood Loss:     Estimated blood loss: none. Procedure:                Pre-Anesthesia Assessment: - Prior to the                            procedure, a history and physical was performed,                            and patient medications and allergies were                            reviewed. The patient's tolerance of previous                            anesthesia was also reviewed. The risks and                            benefits of the procedure and the sedation options                            and risks were discussed with the patient. All                            questions were answered, and informed consent was                            obtained. Prior Anticoagulants: The patient has                            taken no previous anticoagulant or antiplatelet                            agents. ASA Grade Assessment: II - A patient with  mild systemic disease. After reviewing the risks                            and benefits, the patient was deemed in                            satisfactory condition to undergo the procedure.                            After obtaining informed consent, the endoscope was                            passed under direct vision. Throughout the                            procedure, the  patient's blood pressure, pulse, and                            oxygen saturations were monitored continuously. The                            GIF-H190 ([id number]) Olympus gastroscope was                            introduced through the mouth, and advanced to the                            second part of duodenum. The EGD was accomplished                            without difficulty. The patient tolerated the                            procedure well. Scope In: Scope Out: Findings:      LA Grade A (one or more mucosal breaks less than 5 mm, not extending       between tops of 2 mucosal folds) esophagitis with no bleeding was found.      Patchy moderate inflammation characterized by erythema was found in the       entire examined stomach.      The cardia and gastric fundus were normal on retroflexion.      The examined duodenum was normal. Impression:               - LA Grade A reflux esophagitis.                           - Patchy moderate gastritis.                           - Normal examined duodenum.                           - No specimens collected. Moderate Sedation:      MAC used. Recommendation:           - High fiber diet with augmented  water consumption                            daily.                           - Continue present medications.                           - PPI 's orally along with Sucralfate suspension 1                            gm QID PO.                           - No Marijuana, Ibuprofen, Naproxen, or other                            non-steroidal anti-inflammatory drugs. Procedure Code(s):        --- Professional ---                           [phone], Esophagogastroduodenoscopy, flexible,                            transoral; diagnostic, including collection of                            specimen(s) by brushing or washing, when performed                            (separate procedure) Diagnosis Code(s):        --- Professional ---                            K92.0, Hematemesis                           K21.0, Gastro-esophageal reflux disease with                            esophagitis                           K29.70, Gastritis, unspecified, without bleeding                           D62, Acute posthemorrhagic anemia                           R11.2, Nausea with vomiting, unspecified CPT copyright 2018 American [employer] Association. All rights reserved. The codes documented in this report are preliminary and upon coder review may  be revised to meet current compliance requirements. Juanita Craver, MD Juanita Craver, MD 09/27/2018 2:48:42 PM This report has been signed electronically. Number of Addenda: 0

## 2018-09-27 NOTE — Progress Notes (Addendum)
PROGRESS NOTE        PATIENT DETAILS Name: Lee Greer Age: 53 y.o. Sex: male Date of Birth: [birth year] Admit Date: 09/26/2018 Admitting Physician Etta Quill, DO GYF:VCBSWHQ, Vernia Buff, NP  Brief Narrative: [name] prior history of recurrent nausea/vomiting thought to have cannabinoid hyperemesis syndrome-presenting with upper GI bleeding and acute blood loss anemia.  See below for further details  Subjective: No further hematemesis since admission.  Denies any BM since admission.  No abdominal pain.  Assessment/Plan: Upper GI bleeding with acute blood loss anemia: Given prior history of vomiting prior to the episode of hematemesis-high suspicion for Mallory-Weiss tear.  Significant drop in hemoglobin overnight-requiring 2 units of PRBC transfusion-posttransfusion hemoglobin improved at 8.3.  Repeat CBC later today.  GI consulted-for EGD later today.  Continue PPI.  AKI: Likely hemodynamically mediated-due to above-resolved with supportive care.  Follow electrolytes periodically  Hypokalemia: Secondary to vomiting- Repleted-recheck periodically  Cannabinoid hyperemesis syndrome: Likely causing recurrent nausea/vomiting-although he claims his last marijuana use was approximately a month back-his urine drug screen is still positive.  Counseled again this morning  Non-severe (moderate) malnutrition   DVT Prophylaxis: SCD's  Code Status: Full code   Family Communication: None at bedside  Disposition Plan: Remain inpatient  Antimicrobial agents: Anti-infectives (From admission, onward)   None      Procedures: None  CONSULTS:  GI  Time spent: 25- minutes-Greater than 50% of this time was spent in counseling, explanation of diagnosis, planning of further management, and coordination of care.  MEDICATIONS: Scheduled Meds: . [MAR Hold] multivitamin with minerals  1 tablet Oral Daily  . [MAR Hold] pantoprazole  40 mg  Intravenous Q12H   Continuous Infusions: . sodium chloride 125 mL/hr at 09/26/18 2030  . sodium chloride 20 mL/hr at 09/27/18 0514  . lactated ringers    . pantoprozole (PROTONIX) infusion 8 mg/hr (09/27/18 0206)   PRN Meds:.[MAR Hold] acetaminophen **OR** [MAR Hold] acetaminophen, [MAR Hold] ALPRAZolam, lactated ringers, [MAR Hold] ondansetron **OR** [MAR Hold] ondansetron (ZOFRAN) IV   PHYSICAL EXAM: Vital signs: Vitals:   09/27/18 0330 09/27/18 0333 09/27/18 0744 09/27/18 1343  BP:  112/78 110/73 114/76  Pulse:   90   Resp: 19 17 14 18   Temp:  98.6 F (37 C) 98.3 F (36.8 C)   TempSrc:  Oral Oral   SpO2:   98% 97%  Weight:    57.6 kg  Height:    6\' 1"  (1.854 m)   Filed Weights   09/26/18 0058 09/27/18 1343  Weight: 57.6 kg 57.6 kg   Body mass index is 16.76 kg/m.   General appearance :Awake, alert, not in any distress. Eyes:Pink conjunctiva HEENT: Atraumatic and Normocephalic Neck: supple, Resp:Good air entry bilaterally, no added sounds  CVS: S1 S2 regular, no murmurs.  GI: Bowel sounds present, Non tender and not distended with no gaurding, rigidity or rebound.No organomegaly Extremities: B/L Lower Ext shows no edema, both legs are warm to touch Neurology:  speech clear,Non focal, sensation is grossly intact. Psychiatric: Normal judgment and insight. Alert and oriented x 3. Normal mood. Musculoskeletal:No digital cyanosis Skin:No Rash, warm and dry Wounds:N/A  I have personally reviewed following labs and imaging studies  LABORATORY DATA: CBC: Recent Labs  Lab 09/26/18 0111 09/26/18 0112 09/26/18 0824 09/26/18 1434 09/26/18 2113 09/27/18 0702  WBC 9.5  --  12.1*  --   --   --   HGB 10.1* 10.9* 7.4* 7.4* 6.2* 8.6*  HCT 30.1* 32.0* 22.4* 22.2* 18.1* 25.3*  MCV 91.8  --  92.9  --   --   --   PLT 230  --  200  --   --   --     Basic Metabolic Panel: Recent Labs  Lab 09/26/18 0111 09/26/18 0112 09/26/18 0824  NA 135 135 137  K 3.2* 3.2* 4.9    CL 102 103 111  CO2 16*  --  20*  GLUCOSE 290* 276* 100*  BUN 39* 37* 36*  CREATININE 1.97* 1.60* 1.07  CALCIUM 8.6*  --  7.7*    GFR: Estimated Creatinine Clearance: 66.5 mL/min (by C-G formula based on SCr of 1.07 mg/dL).  Liver Function Tests: Recent Labs  Lab 09/26/18 0111  AST 23  ALT 12  ALKPHOS 44  BILITOT 0.7  PROT 6.3*  ALBUMIN 3.4*   No results for input(s): LIPASE, AMYLASE in the last 168 hours. No results for input(s): AMMONIA in the last 168 hours.  Coagulation Profile: Recent Labs  Lab 09/26/18 0111  INR 1.26    Cardiac Enzymes: No results for input(s): CKTOTAL, CKMB, CKMBINDEX, TROPONINI in the last 168 hours.  BNP (last 3 results) No results for input(s): PROBNP in the last 8760 hours.  HbA1C: Recent Labs    09/26/18 0343  HGBA1C 5.6    CBG: Recent Labs  Lab 09/26/18 0804 09/26/18 1237 09/26/18 2138 09/27/18 0339  GLUCAP 85 93 102* 86    Lipid Profile: No results for input(s): CHOL, HDL, LDLCALC, TRIG, CHOLHDL, LDLDIRECT in the last 72 hours.  Thyroid Function Tests: No results for input(s): TSH, T4TOTAL, FREET4, T3FREE, THYROIDAB in the last 72 hours.  Anemia Panel: No results for input(s): VITAMINB12, FOLATE, FERRITIN, TIBC, IRON, RETICCTPCT in the last 72 hours.  Urine analysis:    Component Value Date/Time   COLORURINE YELLOW 09/08/2018 1102   APPEARANCEUR CLEAR 09/08/2018 1102   LABSPEC 1.018 09/08/2018 1102   PHURINE 6.0 09/08/2018 1102   GLUCOSEU 50 (A) 09/08/2018 1102   HGBUR MODERATE (A) 09/08/2018 1102   BILIRUBINUR NEGATIVE 09/08/2018 1102   BILIRUBINUR small 02/17/2015 1222   KETONESUR 5 (A) 09/08/2018 1102   PROTEINUR NEGATIVE 09/08/2018 1102   UROBILINOGEN 0.2 02/17/2015 1222   UROBILINOGEN 0.2 08/30/2014 0735   NITRITE NEGATIVE 09/08/2018 1102   LEUKOCYTESUR NEGATIVE 09/08/2018 1102    Sepsis Labs: Lactic Acid, Venous    Component Value Date/Time   LATICACIDVEN 2.2 03/28/2009 0740     MICROBIOLOGY: Recent Results (from the past 240 hour(s))  MRSA PCR Screening     Status: None   Collection Time: 09/26/18  2:15 PM  Result Value Ref Range Status   MRSA by PCR NEGATIVE NEGATIVE Final    Comment:        The GeneXpert MRSA Assay (FDA approved for NASAL specimens only), is one component of a comprehensive MRSA colonization surveillance program. It is not intended to diagnose MRSA infection nor to guide or monitor treatment for MRSA infections. Performed at [facility], [place] [address], [place], [place] [address]     RADIOLOGY STUDIES/RESULTS: Dg Chest Port 1 View  Result Date: 09/26/2018 CLINICAL DATA:  Weakness EXAM: PORTABLE CHEST 1 VIEW COMPARISON:  09/16/2017 FINDINGS: Hyperinflated lungs. No focal airspace disease or effusion. Normal heart size. No pneumothorax. IMPRESSION: Hyperinflation without acute airspace disease Electronically Signed   By: Donavan Foil  M.D.   On: 09/26/2018 01:18     LOS: 1 day   Oren Binet, MD  [facility]  If 7PM-7AM, please contact night-coverage  Please page via [email or web address]-click on MD name and type text message  09/27/2018, 2:02 PM

## 2018-09-27 NOTE — Progress Notes (Signed)
Initial Nutrition Assessment  DOCUMENTATION CODES:   Non-severe (moderate) malnutrition in context of social or environmental circumstances, Underweight  INTERVENTION:   - Once diet advanced, double protein portions with all meals  - MVI with minerals daily  NUTRITION DIAGNOSIS:   Moderate Malnutrition related to social / environmental circumstances (cannabis use with cyclical vomiting syndrome) as evidenced by mild fat depletion, mild muscle depletion, percent weight loss (13.6% weight loss in 1 month).  GOAL:   Patient will meet greater than or equal to 90% of their needs, Weight gain  MONITOR:   PO intake, Diet advancement, Weight trends, Labs  REASON FOR ASSESSMENT:   Other (underweight BMI)    ASSESSMENT:   53 year old male who presented to the ED on 1/21 with hematemesis. PMH significant for chronic N/V thought to be gastroparesis vs cannabinoid hyperemesis syndrome vs cyclical vomiting syndrome and DM. Pt admitted with UGIB and AKI.  Noted plan for EGD today. Pt NPO, complaining of hunger at time of visit.  Pt states that he typically has a good appetite. Pt notes that he will got 3-4 months without N/V then usually has to go to the hospital due to N/V. Pt shares that this is related to his cannabis use.  Pt shares that he typically eats 2-3 meals daily.  Breakfast: skips or has sausage and eggs Lunch: sandwich Dinner: "something solid"  Pt shares that he loses weight when he skips meals but when he "concentrates on eating," he can get up to 155 lbs which he states is his comfortable weight. Pt reports that his lowest weight recently has been 120 lbs.  Per weight history in chart, pt with 9.1 kg weight loss over the last 1 month. This is a 13.6% weight loss which is severe and significant for timeframe.  Pt declines oral nutrition supplements but is amenable to double protein portions with meals. RD will also order daily MVI.  Medications reviewed and include:  IV Protonix IVF: NS @ 20 ml/hr  Labs reviewed: hemoglobin 8.6 (L), calcium 7.7 (L) CBG's: 86, 102, 93 x 24 hours  NUTRITION - FOCUSED PHYSICAL EXAM:    Most Recent Value  Orbital Region  No depletion  Upper Arm Region  Mild depletion  Thoracic and Lumbar Region  Mild depletion  Buccal Region  No depletion  Temple Region  Mild depletion  Clavicle Bone Region  Mild depletion  Clavicle and Acromion Bone Region  Mild depletion  Scapular Bone Region  No depletion  Dorsal Hand  No depletion  Patellar Region  Mild depletion  Anterior Thigh Region  Mild depletion  Posterior Calf Region  Mild depletion  Edema (RD Assessment)  None  Hair  Reviewed  Eyes  Reviewed  Mouth  Reviewed  Skin  Reviewed  Nails  Reviewed       Diet Order:   Diet Order            Diet NPO time specified  Diet effective now              EDUCATION NEEDS:   Education needs have been addressed  Skin:  Skin Assessment: Reviewed RN Assessment  Last BM:  1/21  Height:   Ht Readings from Last 1 Encounters:  09/26/18 6\' 1"  (1.854 m)    Weight:   Wt Readings from Last 1 Encounters:  09/26/18 57.6 kg    Ideal Body Weight:  83.6 kg  BMI:  Body mass index is 16.76 kg/m.  Estimated Nutritional Needs:   Kcal:  2100-2300  Protein:  100-115 grams  Fluid:  >/= 2.1 L    Gaynell Face, MS, RD, LDN Inpatient Clinical Dietitian Pager: [phone] Weekend/After Hours: [phone]

## 2018-09-27 NOTE — Anesthesia Preprocedure Evaluation (Addendum)
Anesthesia Evaluation  Patient identified by MRN, date of birth, ID band Patient awake    Reviewed: Allergy & Precautions, NPO status , Patient's Chart, lab work & pertinent test results  Airway Mallampati: I  TM Distance: >3 FB Neck ROM: Full    Dental  (+) Teeth Intact, Dental Advisory Given   Pulmonary neg pulmonary ROS,    Pulmonary exam normal breath sounds clear to auscultation       Cardiovascular Exercise Tolerance: Good negative cardio ROS Normal cardiovascular exam Rhythm:Regular Rate:Normal     Neuro/Psych negative neurological ROS     GI/Hepatic Neg liver ROS, Gastroparesis Hematemesis and anemia   Endo/Other  diabetes, Type 2  Renal/GU negative Renal ROS     Musculoskeletal negative musculoskeletal ROS (+)   Abdominal Normal abdominal exam  (+)   Peds  Hematology  (+) Blood dyscrasia, anemia ,   Anesthesia Other Findings Day of surgery medications reviewed with the patient.  Reproductive/Obstetrics                           Anesthesia Physical Anesthesia Plan  ASA: II  Anesthesia Plan: MAC   Post-op Pain Management:    Induction: Intravenous  PONV Risk Score and Plan: 1 and Propofol infusion and Treatment may vary due to age or medical condition  Airway Management Planned: Nasal Cannula and Natural Airway  Additional Equipment:   Intra-op Plan:   Post-operative Plan:   Informed Consent: I have reviewed the patients History and Physical, chart, labs and discussed the procedure including the risks, benefits and alternatives for the proposed anesthesia with the patient or authorized representative who has indicated his/her understanding and acceptance.     Dental advisory given  Plan Discussed with: CRNA and Anesthesiologist  Anesthesia Plan Comments:         Anesthesia Quick Evaluation

## 2018-09-27 NOTE — Anesthesia Procedure Notes (Signed)
Procedure Name: MAC Date/Time: 09/27/2018 2:15 PM Performed by: Eligha Bridegroom, CRNA Pre-anesthesia Checklist: Patient identified, Emergency Drugs available, Suction available, Patient being monitored and Timeout performed Patient Re-evaluated:Patient Re-evaluated prior to induction Oxygen Delivery Method: Nasal cannula Preoxygenation: Pre-oxygenation with 100% oxygen Induction Type: IV induction

## 2018-09-27 NOTE — Transfer of Care (Signed)
Immediate Anesthesia Transfer of Care Note  Patient: Lee Greer  Procedure(s) Performed: ESOPHAGOGASTRODUODENOSCOPY (EGD) WITH PROPOFOL (N/A )  Patient Location: PACU and Endoscopy Unit  Anesthesia Type:MAC  Level of Consciousness: awake, alert  and oriented  Airway & Oxygen Therapy: Patient Spontanous Breathing  Post-op Assessment: Report given to RN and Post -op Vital signs reviewed and stable  Post vital signs: Reviewed and stable  Last Vitals:  Vitals Value Taken Time  BP    Temp    Pulse    Resp    SpO2      Last Pain:  Vitals:   09/27/18 1343  TempSrc:   PainSc: 0-No pain      Patients Stated Pain Goal: 0 (16/60/60 0459)  Complications: No apparent anesthesia complications

## 2018-09-27 NOTE — Progress Notes (Signed)
Spoke with endo lab and notified her that pt wants to speak with MD concerning risks for the procedure before signing consent. Pt will speak with MD once in endo lab and sign consent there.

## 2018-09-28 DIAGNOSIS — E440 Moderate protein-calorie malnutrition: Secondary | ICD-10-CM

## 2018-09-28 LAB — TYPE AND SCREEN
ABO/RH(D): A NEG
Antibody Screen: NEGATIVE
Unit division: 0
Unit division: 0

## 2018-09-28 LAB — BPAM RBC
Blood Product Expiration Date: 202001232359
Blood Product Expiration Date: 202001312359
ISSUE DATE / TIME: 202001212304
ISSUE DATE / TIME: 202001220128
Unit Type and Rh: 600
Unit Type and Rh: 600

## 2018-09-28 LAB — BASIC METABOLIC PANEL
Anion gap: 8 (ref 5–15)
BUN: 10 mg/dL (ref 6–20)
CALCIUM: 7.8 mg/dL — AB (ref 8.9–10.3)
CO2: 20 mmol/L — AB (ref 22–32)
Chloride: 110 mmol/L (ref 98–111)
Creatinine, Ser: 0.92 mg/dL (ref 0.61–1.24)
GFR calc non Af Amer: >60 mL/min (ref >60)
Glucose, Bld: 94 mg/dL (ref 70–99)
Potassium: 3.4 mmol/L — ABNORMAL LOW (ref 3.5–5.1)
Sodium: 138 mmol/L (ref 135–145)

## 2018-09-28 LAB — CBC
HEMATOCRIT: 23.5 % — AB (ref 39.0–52.0)
Hemoglobin: 8.2 g/dL — ABNORMAL LOW (ref 13.0–17.0)
MCH: 30.7 pg (ref 26.0–34.0)
MCHC: 34.9 g/dL (ref 30.0–36.0)
MCV: 88 fL (ref 80.0–100.0)
Platelets: 170 10*3/uL (ref 150–400)
RBC: 2.67 MIL/uL — ABNORMAL LOW (ref 4.22–5.81)
RDW: 13.6 % (ref 11.5–15.5)
WBC: 11.2 10*3/uL — ABNORMAL HIGH (ref 4.0–10.5)
nRBC: 0 % (ref 0.0–0.2)

## 2018-09-28 LAB — GLUCOSE, CAPILLARY: Glucose-Capillary: 87 mg/dL (ref 70–99)

## 2018-09-28 MED ORDER — SUCRALFATE 1 GM/10ML PO SUSP
1.0000 g | Freq: Three times a day (TID) | ORAL | Status: DC
  Administered 2018-09-28: 1 g via ORAL
  Filled 2018-09-28: qty 10

## 2018-09-28 MED ORDER — POTASSIUM CHLORIDE CRYS ER 20 MEQ PO TBCR
40.0000 meq | EXTENDED_RELEASE_TABLET | Freq: Once | ORAL | Status: AC
  Administered 2018-09-28: 40 meq via ORAL
  Filled 2018-09-28: qty 2

## 2018-09-28 MED ORDER — SUCRALFATE 1 GM/10ML PO SUSP: 1.0000 g | Freq: Three times a day (TID) | ORAL | 0 refills | Status: DC

## 2018-09-28 MED ORDER — PANTOPRAZOLE SODIUM 40 MG PO TBEC: 40.0000 mg | DELAYED_RELEASE_TABLET | Freq: Two times a day (BID) | ORAL | 0 refills | Status: DC

## 2018-09-28 MED ORDER — FERROUS SULFATE 325 (65 FE) MG PO TABS: 325.0000 mg | ORAL_TABLET | Freq: Two times a day (BID) | ORAL | 0 refills | Status: DC

## 2018-09-28 NOTE — Progress Notes (Signed)
PC monitor removed

## 2018-09-28 NOTE — Progress Notes (Signed)
Discharge instructions given to patient.  Patient verbalized understanding of discharge instructions.  PIV removed and area CDI.    Discharge vitals Vitals:   09/28/18 0049 09/28/18 0850  BP: 102/66 104/76  Pulse:    Resp: 17 13  Temp: 98 F (36.7 C) 98.6 F (37 C)  SpO2: 98% 100%   Discharge meds Allergies as of 09/28/2018   No Known Allergies     Medication List    STOP taking these medications   acetaminophen 325 MG tablet Commonly known as:  TYLENOL   ondansetron 4 MG disintegrating tablet Commonly known as:  ZOFRAN ODT   ondansetron 8 MG tablet Commonly known as:  ZOFRAN   potassium chloride 10 MEQ tablet Commonly known as:  K-DUR   promethazine 25 MG tablet Commonly known as:  PHENERGAN     TAKE these medications   ferrous sulfate 325 (65 FE) MG tablet Take 1 tablet (325 mg total) by mouth 2 (two) times daily with a meal.   pantoprazole 40 MG tablet Commonly known as:  PROTONIX Take 1 tablet (40 mg total) by mouth 2 (two) times daily.   sucralfate 1 GM/10ML suspension Commonly known as:  CARAFATE Take 10 mLs (1 g total) by mouth 4 (four) times daily -  with meals and at bedtime.      Patient waiting on son to arrive.

## 2018-09-28 NOTE — Discharge Summary (Signed)
PATIENT DETAILS Name: Lee Greer Age: 53 y.o. Sex: male Date of Birth: 1966-01-23 MRN: [id number]. Admitting Physician: Etta Quill, DO [id number], Vernia Buff, NP  Admit Date: 09/26/2018 Discharge date: 09/28/2018  Recommendations for Outpatient Follow-up:  1. Follow up with PCP in 1-2 weeks 2. Please obtain BMP/CBC in one week 3. Please continue counseling regarding importance of completely abstaining from marijuana use  Admitted From:  Home   Disposition: [place]: No  Equipment/Devices: None  Discharge Condition: Stable  CODE STATUS: FULL CODE  Diet recommendation:  Regular  Brief Summary: See H&P, Labs, Consult and Test reports for all details in brief, [name] prior history of recurrent nausea/vomiting thought to have cannabinoid hyperemesis syndrome-presenting with upper GI bleeding and acute blood loss anemia.  See below for further details  Brief Hospital Course: Upper GI bleeding with acute blood loss anemia: Given prior history of vomiting prior to the episode of hematemesis-suspected to have a Mallory-Weiss tear-however EGD showed LA grade A esophagitis.  Did have significant drop in hemoglobin post admission requiring units of PRBC transfusion.  Hemoglobin stable overnight.  No further hematemesis.  Recommendations from GI are to continue PPI-and start Carafate.  Spoke with Dr. Levert Feinstein further recommendations-okay to discharge.  Will place on iron supplementation on discharge.  Patient instructed to follow with PCP in 1 week for repeat CBC.  AKI: Likely hemodynamically mediated-due to above-resolved with supportive care.  Follow electrolytes periodically as outpatient  Hypokalemia: Secondary to vomiting- will replete-recheck electrolytes in 1 week.  Cannabinoid hyperemesis syndrome: Likely causing recurrent nausea/vomiting-although he claims his last marijuana use was approximately a month back-his urine drug screen  is still positive.  Counseled again this morning  Non-severe (moderate) malnutrition   Procedures/Studies: 1/22>>EGD  Discharge Diagnoses:  Principal Problem:   Hematemesis Active Problems:   Nausea with vomiting   AKI (acute kidney injury) ([place])   UGIB (upper gastrointestinal bleed)   Malnutrition of moderate degree   Discharge Instructions:  Activity:  As tolerated   Discharge Instructions    Call MD for:   Complete by:  As directed    If you have further episodes of hematemesis (vomit blood)   Diet general   Complete by:  As directed    Discharge instructions   Complete by:  As directed    Follow with Primary MD  Gildardo Pounds, NP in 1 week  No further marijuana use!!  Please get a complete blood count and chemistry panel checked by your Primary MD at your next visit, and again as instructed by your Primary MD.  Get Medicines reviewed and adjusted: Please take all your medications with you for your next visit with your Primary MD  Laboratory/radiological data: Please request your Primary MD to go over all hospital tests and procedure/radiological results at the follow up, please ask your Primary MD to get all Hospital records sent to his/her office.  In some cases, they will be blood work, cultures and biopsy results pending at the time of your discharge. Please request that your primary care M.D. follows up on these results.  Also Note the following: If you experience worsening of your admission symptoms, develop shortness of breath, life threatening emergency, suicidal or homicidal thoughts you must seek medical attention immediately by calling 911 or calling your MD immediately  if symptoms less severe.  You must read complete instructions/literature along with all the possible adverse reactions/side effects for all the  Medicines you take and that have been prescribed to you. Take any new Medicines after you have completely understood and accpet all the  possible adverse reactions/side effects.   Do not drive when taking Pain medications or sleeping medications (Benzodaizepines)  Do not take more than prescribed Pain, Sleep and Anxiety Medications. It is not advisable to combine anxiety,sleep and pain medications without talking with your primary care practitioner  Special Instructions: If you have smoked or chewed Tobacco  in the last 2 yrs please stop smoking, stop any regular Alcohol  and or any Recreational drug use.  Wear Seat belts while driving.  Please note: You were cared for by a hospitalist during your hospital stay. Once you are discharged, your primary care physician will handle any further medical issues. Please note that NO REFILLS for any discharge medications will be authorized once you are discharged, as it is imperative that you return to your primary care physician (or establish a relationship with a primary care physician if you do not have one) for your post hospital discharge needs so that they can reassess your need for medications and monitor your lab values.   Increase activity slowly   Complete by:  As directed      Allergies as of 09/28/2018   No Known Allergies     Medication List    STOP taking these medications   acetaminophen 325 MG tablet Commonly known as:  TYLENOL   ondansetron 4 MG disintegrating tablet Commonly known as:  ZOFRAN ODT   ondansetron 8 MG tablet Commonly known as:  ZOFRAN   potassium chloride 10 MEQ tablet Commonly known as:  K-DUR   promethazine 25 MG tablet Commonly known as:  PHENERGAN     TAKE these medications   ferrous sulfate 325 (65 FE) MG tablet Take 1 tablet (325 mg total) by mouth 2 (two) times daily with a meal.   pantoprazole 40 MG tablet Commonly known as:  PROTONIX Take 1 tablet (40 mg total) by mouth 2 (two) times daily.   sucralfate 1 GM/10ML suspension Commonly known as:  CARAFATE Take 10 mLs (1 g total) by mouth 4 (four) times daily -  with meals and  at bedtime.      Follow-up Information    Gildardo Pounds, NP. Schedule an appointment as soon as possible for a visit in 1 week(s).   Specialty:  Nurse Practitioner Contact information: [place] [address] [phone]        Carol Ada, MD. Schedule an appointment as soon as possible for a visit in 2 week(s).   Specialty:  Gastroenterology Contact information: [place], [place] [address] [phone]          No Known Allergies  Consultations:   GI  Other Procedures/Studies: Dg Chest Port 1 View  Result Date: 09/26/2018 CLINICAL DATA:  Weakness EXAM: PORTABLE CHEST 1 VIEW COMPARISON:  09/16/2017 FINDINGS: Hyperinflated lungs. No focal airspace disease or effusion. Normal heart size. No pneumothorax. IMPRESSION: Hyperinflation without acute airspace disease Electronically Signed   By: Donavan Foil M.D.   On: 09/26/2018 01:18     TODAY-DAY OF DISCHARGE:  Subjective:   Lee Greer today has no headache,no chest abdominal pain,no new weakness tingling or numbness, feels much better wants to go home today.   Objective:   Blood pressure 104/76, pulse 87, temperature 98.6 F (37 C), resp. rate 13, height 6\' 1"  (1.854 m), weight 57.6 kg, SpO2 100 %.  Intake/Output Summary (  Last 24 hours) at 09/28/2018 1054 Last data filed at 09/28/2018 0735 Gross per 24 hour  Intake 1645.07 ml  Output 675 ml  Net 970.07 ml   Filed Weights   09/26/18 0058 09/27/18 1343  Weight: 57.6 kg 57.6 kg    Exam: Awake Alert, Oriented *3, No new F.N deficits, Normal affect [place].AT,PERRAL Supple Neck,No JVD, No cervical lymphadenopathy appriciated.  Symmetrical Chest wall movement, Good air movement bilaterally, CTAB RRR,No Gallops,Rubs or new Murmurs, No Parasternal Heave +ve B.Sounds, Abd Soft, Non tender, No organomegaly appriciated, No rebound -guarding or rigidity. No Cyanosis, Clubbing or edema, No new Rash or  bruise   PERTINENT RADIOLOGIC STUDIES: Dg Chest Port 1 View  Result Date: 09/26/2018 CLINICAL DATA:  Weakness EXAM: PORTABLE CHEST 1 VIEW COMPARISON:  09/16/2017 FINDINGS: Hyperinflated lungs. No focal airspace disease or effusion. Normal heart size. No pneumothorax. IMPRESSION: Hyperinflation without acute airspace disease Electronically Signed   By: Donavan Foil M.D.   On: 09/26/2018 01:18     PERTINENT LAB RESULTS: CBC: Recent Labs    09/27/18 1657 09/28/18 0435  WBC 12.8* 11.2*  HGB 8.9* 8.2*  HCT 26.3* 23.5*  PLT 165 170   CMET CMP     Component Value Date/Time   NA 138 09/28/2018 0435   K 3.4 (L) 09/28/2018 0435   CL 110 09/28/2018 0435   CO2 20 (L) 09/28/2018 0435   GLUCOSE 94 09/28/2018 0435   BUN 10 09/28/2018 0435   CREATININE 0.92 09/28/2018 0435   CALCIUM 7.8 (L) 09/28/2018 0435   PROT 6.3 (L) 09/26/2018 0111   ALBUMIN 3.4 (L) 09/26/2018 0111   AST 23 09/26/2018 0111   ALT 12 09/26/2018 0111   ALKPHOS 44 09/26/2018 0111   BILITOT 0.7 09/26/2018 0111   GFRNONAA >60 09/28/2018 0435   GFRAA >60 09/28/2018 0435    GFR Estimated Creatinine Clearance: 77.4 mL/min (by C-G formula based on SCr of 0.92 mg/dL). No results for input(s): LIPASE, AMYLASE in the last 72 hours. No results for input(s): CKTOTAL, CKMB, CKMBINDEX, TROPONINI in the last 72 hours. Invalid input(s): POCBNP No results for input(s): DDIMER in the last 72 hours. Recent Labs    09/26/18 0343  HGBA1C 5.6   No results for input(s): CHOL, HDL, LDLCALC, TRIG, CHOLHDL, LDLDIRECT in the last 72 hours. No results for input(s): TSH, T4TOTAL, T3FREE, THYROIDAB in the last 72 hours.  Invalid input(s): FREET3 No results for input(s): VITAMINB12, FOLATE, FERRITIN, TIBC, IRON, RETICCTPCT in the last 72 hours. Coags: Recent Labs    09/26/18 0111  INR 1.26   Microbiology: Recent Results (from the past 240 hour(s))  MRSA PCR Screening     Status: None   Collection Time: 09/26/18  2:15 PM   Result Value Ref Range Status   MRSA by PCR NEGATIVE NEGATIVE Final    Comment:        The GeneXpert MRSA Assay (FDA approved for NASAL specimens only), is one component of a comprehensive MRSA colonization surveillance program. It is not intended to diagnose MRSA infection nor to guide or monitor treatment for MRSA infections. Performed at [facility], [place] [address], [place], [place] [address]     FURTHER DISCHARGE INSTRUCTIONS:  Get Medicines reviewed and adjusted: Please take all your medications with you for your next visit with your Primary MD  Laboratory/radiological data: Please request your Primary MD to go over all hospital tests and procedure/radiological results at the follow up, please ask your Primary MD to get all  Hospital records sent to his/her office.  In some cases, they will be blood work, cultures and biopsy results pending at the time of your discharge. Please request that your primary care M.D. goes through all the records of your hospital data and follows up on these results.  Also Note the following: If you experience worsening of your admission symptoms, develop shortness of breath, life threatening emergency, suicidal or homicidal thoughts you must seek medical attention immediately by calling 911 or calling your MD immediately  if symptoms less severe.  You must read complete instructions/literature along with all the possible adverse reactions/side effects for all the Medicines you take and that have been prescribed to you. Take any new Medicines after you have completely understood and accpet all the possible adverse reactions/side effects.   Do not drive when taking Pain medications or sleeping medications (Benzodaizepines)  Do not take more than prescribed Pain, Sleep and Anxiety Medications. It is not advisable to combine anxiety,sleep and pain medications without talking with your primary care practitioner  Special Instructions: If you  have smoked or chewed Tobacco  in the last 2 yrs please stop smoking, stop any regular Alcohol  and or any Recreational drug use.  Wear Seat belts while driving.  Please note: You were cared for by a hospitalist during your hospital stay. Once you are discharged, your primary care physician will handle any further medical issues. Please note that NO REFILLS for any discharge medications will be authorized once you are discharged, as it is imperative that you return to your primary care physician (or establish a relationship with a primary care physician if you do not have one) for your post hospital discharge needs so that they can reassess your need for medications and monitor your lab values.  Total Time spent coordinating discharge including counseling, education and face to face time equals 35 minutes.  SignedOren Binet 09/28/2018 10:54 AM

## 2018-10-03 ENCOUNTER — Encounter (HOSPITAL_COMMUNITY): Payer: Self-pay

## 2018-10-03 ENCOUNTER — Other Ambulatory Visit: Payer: Self-pay

## 2018-10-03 ENCOUNTER — Inpatient Hospital Stay (HOSPITAL_COMMUNITY)
Admission: EM | Admit: 2018-10-03 | Discharge: 2018-10-07 | DRG: 378 | Disposition: A | Discharge status: Home / Self Care | Payer: Self-pay | Attending: Family Medicine | Admitting: Family Medicine

## 2018-10-03 DIAGNOSIS — K250 Acute gastric ulcer with hemorrhage: Secondary | ICD-10-CM

## 2018-10-03 DIAGNOSIS — R61 Generalized hyperhidrosis: Secondary | ICD-10-CM | POA: Diagnosis present

## 2018-10-03 DIAGNOSIS — E869 Volume depletion, unspecified: Secondary | ICD-10-CM | POA: Diagnosis present

## 2018-10-03 DIAGNOSIS — E440 Moderate protein-calorie malnutrition: Secondary | ICD-10-CM | POA: Diagnosis present

## 2018-10-03 DIAGNOSIS — K254 Chronic or unspecified gastric ulcer with hemorrhage: Principal | ICD-10-CM | POA: Diagnosis present

## 2018-10-03 DIAGNOSIS — E778 Other disorders of glycoprotein metabolism: Secondary | ICD-10-CM | POA: Diagnosis present

## 2018-10-03 DIAGNOSIS — R1110 Vomiting, unspecified: Secondary | ICD-10-CM | POA: Diagnosis present

## 2018-10-03 DIAGNOSIS — K922 Gastrointestinal hemorrhage, unspecified: Secondary | ICD-10-CM | POA: Diagnosis present

## 2018-10-03 DIAGNOSIS — E1143 Type 2 diabetes mellitus with diabetic autonomic (poly)neuropathy: Secondary | ICD-10-CM | POA: Diagnosis present

## 2018-10-03 DIAGNOSIS — N179 Acute kidney failure, unspecified: Secondary | ICD-10-CM | POA: Diagnosis present

## 2018-10-03 DIAGNOSIS — D62 Acute posthemorrhagic anemia: Secondary | ICD-10-CM | POA: Diagnosis present

## 2018-10-03 DIAGNOSIS — K2270 Barrett's esophagus without dysplasia: Secondary | ICD-10-CM | POA: Diagnosis present

## 2018-10-03 DIAGNOSIS — E119 Type 2 diabetes mellitus without complications: Secondary | ICD-10-CM

## 2018-10-03 DIAGNOSIS — E860 Dehydration: Secondary | ICD-10-CM | POA: Diagnosis present

## 2018-10-03 DIAGNOSIS — K2900 Acute gastritis without bleeding: Secondary | ICD-10-CM

## 2018-10-03 DIAGNOSIS — D649 Anemia, unspecified: Secondary | ICD-10-CM | POA: Diagnosis present

## 2018-10-03 DIAGNOSIS — K3184 Gastroparesis: Secondary | ICD-10-CM | POA: Diagnosis present

## 2018-10-03 DIAGNOSIS — E876 Hypokalemia: Secondary | ICD-10-CM | POA: Diagnosis present

## 2018-10-03 DIAGNOSIS — K210 Gastro-esophageal reflux disease with esophagitis: Secondary | ICD-10-CM | POA: Diagnosis present

## 2018-10-03 DIAGNOSIS — Z681 Body mass index (BMI) 19 or less, adult: Secondary | ICD-10-CM

## 2018-10-03 DIAGNOSIS — Z8249 Family history of ischemic heart disease and other diseases of the circulatory system: Secondary | ICD-10-CM

## 2018-10-03 DIAGNOSIS — Z79899 Other long term (current) drug therapy: Secondary | ICD-10-CM

## 2018-10-03 HISTORY — DX: Laceration without foreign body of pharynx and cervical esophagus, initial encounter: S11.21XA

## 2018-10-03 LAB — COMPREHENSIVE METABOLIC PANEL
ALT: 13 U/L (ref 0–44)
ANION GAP: 9 (ref 5–15)
AST: 18 U/L (ref 15–41)
Albumin: 2.9 g/dL — ABNORMAL LOW (ref 3.5–5.0)
Alkaline Phosphatase: 32 U/L — ABNORMAL LOW (ref 38–126)
BUN: 14 mg/dL (ref 6–20)
CO2: 20 mmol/L — ABNORMAL LOW (ref 22–32)
Calcium: 8.1 mg/dL — ABNORMAL LOW (ref 8.9–10.3)
Chloride: 112 mmol/L — ABNORMAL HIGH (ref 98–111)
Creatinine, Ser: 0.8 mg/dL (ref 0.61–1.24)
GFR calc Af Amer: >60 mL/min (ref >60)
GFR calc non Af Amer: >60 mL/min (ref >60)
Glucose, Bld: 126 mg/dL — ABNORMAL HIGH (ref 70–99)
Potassium: 3.4 mmol/L — ABNORMAL LOW (ref 3.5–5.1)
SODIUM: 141 mmol/L (ref 135–145)
Total Bilirubin: 0.4 mg/dL (ref 0.3–1.2)
Total Protein: 4.8 g/dL — ABNORMAL LOW (ref 6.5–8.1)

## 2018-10-03 LAB — URINALYSIS, ROUTINE W REFLEX MICROSCOPIC
Bacteria, UA: NONE SEEN
Bilirubin Urine: NEGATIVE
Glucose, UA: NEGATIVE mg/dL
Hgb urine dipstick: NEGATIVE
Ketones, ur: NEGATIVE mg/dL
Leukocytes, UA: NEGATIVE
Nitrite: NEGATIVE
PROTEIN: NEGATIVE mg/dL
Specific Gravity, Urine: 1.015 (ref 1.005–1.030)
pH: 6 (ref 5.0–8.0)

## 2018-10-03 LAB — CBC
HCT: 13.8 % — ABNORMAL LOW (ref 39.0–52.0)
Hemoglobin: 4.3 g/dL — CL (ref 13.0–17.0)
MCH: 31.9 pg (ref 26.0–34.0)
MCHC: 31.2 g/dL (ref 30.0–36.0)
MCV: 102.2 fL — ABNORMAL HIGH (ref 80.0–100.0)
Platelets: 219 10*3/uL (ref 150–400)
RBC: 1.35 MIL/uL — ABNORMAL LOW (ref 4.22–5.81)
RDW: 17.1 % — ABNORMAL HIGH (ref 11.5–15.5)
WBC: 14.8 10*3/uL — ABNORMAL HIGH (ref 4.0–10.5)
nRBC: 0.3 % — ABNORMAL HIGH (ref 0.0–0.2)

## 2018-10-03 LAB — ABO/RH: ABO/RH(D): A NEG

## 2018-10-03 LAB — POC OCCULT BLOOD, ED: FECAL OCCULT BLD: POSITIVE — AB

## 2018-10-03 LAB — CBG MONITORING, ED: Glucose-Capillary: 114 mg/dL — ABNORMAL HIGH (ref 70–99)

## 2018-10-03 LAB — PREPARE RBC (CROSSMATCH)

## 2018-10-03 MED ORDER — SODIUM CHLORIDE 0.9 % IV BOLUS
1000.0000 mL | Freq: Once | INTRAVENOUS | Status: AC
  Administered 2018-10-04: 1000 mL via INTRAVENOUS

## 2018-10-03 MED ORDER — SODIUM CHLORIDE 0.9% FLUSH
3.0000 mL | Freq: Once | INTRAVENOUS | Status: AC
  Administered 2018-10-03: 3 mL via INTRAVENOUS

## 2018-10-03 MED ORDER — SODIUM CHLORIDE 0.9 % IV SOLN
10.0000 mL/h | Freq: Once | INTRAVENOUS | Status: AC
  Administered 2018-10-03: 10 mL/h via INTRAVENOUS

## 2018-10-03 MED ORDER — SODIUM CHLORIDE 0.9 % IV SOLN
8.0000 mg/h | INTRAVENOUS | Status: AC
  Administered 2018-10-03 – 2018-10-05 (×5): 8 mg/h via INTRAVENOUS
  Filled 2018-10-03 (×11): qty 80

## 2018-10-03 MED ORDER — LORAZEPAM 2 MG/ML IJ SOLN
0.7500 mg | Freq: Once | INTRAMUSCULAR | Status: AC
  Administered 2018-10-04: 0.75 mg via INTRAVENOUS
  Filled 2018-10-03: qty 1

## 2018-10-03 MED ORDER — ACETAMINOPHEN 650 MG RE SUPP: 650.0000 mg | Freq: Four times a day (QID) | RECTAL | Status: DC | PRN

## 2018-10-03 MED ORDER — ACETAMINOPHEN 325 MG PO TABS
650.0000 mg | ORAL_TABLET | Freq: Four times a day (QID) | ORAL | Status: DC | PRN
  Filled 2018-10-03: qty 2

## 2018-10-03 MED ORDER — PANTOPRAZOLE SODIUM 40 MG IV SOLR
40.0000 mg | Freq: Two times a day (BID) | INTRAVENOUS | Status: DC
  Administered 2018-10-07: 40 mg via INTRAVENOUS
  Filled 2018-10-03: qty 40

## 2018-10-03 MED ORDER — SODIUM CHLORIDE 0.9 % IV SOLN
80.0000 mg | Freq: Once | INTRAVENOUS | Status: AC
  Administered 2018-10-03: 80 mg via INTRAVENOUS
  Filled 2018-10-03: qty 80

## 2018-10-03 MED ORDER — ONDANSETRON HCL 4 MG/2ML IJ SOLN
4.0000 mg | Freq: Four times a day (QID) | INTRAMUSCULAR | Status: DC | PRN
  Administered 2018-10-04 (×2): 4 mg via INTRAVENOUS
  Filled 2018-10-03: qty 2

## 2018-10-03 MED ORDER — ONDANSETRON HCL 4 MG PO TABS
4.0000 mg | ORAL_TABLET | Freq: Four times a day (QID) | ORAL | Status: DC | PRN
  Filled 2018-10-03: qty 1

## 2018-10-03 MED ORDER — SODIUM CHLORIDE 0.9% IV SOLUTION
Freq: Once | INTRAVENOUS | Status: AC
  Administered 2018-10-04: 06:00:00 via INTRAVENOUS

## 2018-10-03 NOTE — ED Provider Notes (Signed)
[facility] DEPT Provider Note   CSN: [id number] Arrival date & time: 10/03/18  1751     History   Chief Complaint Chief Complaint  Patient presents with  . GI Bleeding  . Weakness    HPI TIRTH COTHRON is a 53 y.o. male with a hx of DM and recent admission to the hospital 01/21-01/23 for acute upper GI bleed who presents to the emergency department with chief complaint of generalized weakness and melena which occurred around 1430 this afternoon.  Chart review patient with recent admission with hematemesis that was initially suspected to be due to Mallory-Weiss tear, however EGD showed LA grade A esophagitis, no active bleeding and no intervention performed during procedure.  He was discharged home from the hospital with Protonix, Carafate, and iron supplements patient states he is been doing well since time of discharge.  He is not had any repeat episodes of hematemesis or any notable blood in the stool.  He states that the day upon returning home from work he began to feel generally weak, nauseous, somewhat lightheaded, and short of breath.  He states that he laid down and became somewhat sweaty.  He states that he sat in the shower to try make himself feel better but this did not help.  Subsequently had a very large bowel movement with a significant amount of black stool.  His generalized weakness, lightheadedness, and nausea have somewhat persisted.  It is worse when he is up and trying to do things.  No alleviating factors.  He has not had repeat episodes of dark bowel movements.  He has not had any vomiting.  No syncopal events have occurred. Denies abdominal pain. Denies NSAID use. Denies EtOH use.   HPI  Past Medical History:  Diagnosis Date  . Acute kidney injury ([place]) 04/13/2017   related to "dehydration" (04/14/2017)  . Diabetes mellitus without complication ([place])   . Esophageal tear   . Gastroparesis     Patient Active Problem List   Diagnosis Date Noted  . Malnutrition of moderate degree 09/28/2018  . Hematemesis 09/26/2018  . UGIB (upper gastrointestinal bleed) 09/26/2018  . AKI (acute kidney injury) ([place]) 04/13/2017  . Dehydration 04/13/2017  . Nausea with vomiting 02/17/2015    Past Surgical History:  Procedure Laterality Date  . ESOPHAGOGASTRODUODENOSCOPY (EGD) WITH PROPOFOL N/A 09/27/2018   Procedure: ESOPHAGOGASTRODUODENOSCOPY (EGD) WITH PROPOFOL;  Surgeon: Juanita Craver, MD;  Location[facility] ENDOSCOPY;  Service: Endoscopy;  Laterality: N/A;        Home Medications    Prior to Admission medications   Medication Sig Start Date End Date Taking? Authorizing Provider  ferrous sulfate 325 (65 FE) MG tablet Take 1 tablet (325 mg total) by mouth 2 (two) times daily with a meal. 09/28/18   Ghimire, Henreitta Leber, MD  pantoprazole (PROTONIX) 40 MG tablet Take 1 tablet (40 mg total) by mouth 2 (two) times daily. 09/28/18 09/28/19  Ghimire, Henreitta Leber, MD  sucralfate (CARAFATE) 1 GM/10ML suspension Take 10 mLs (1 g total) by mouth 4 (four) times daily -  with meals and at bedtime. 09/28/18   Ghimire, Henreitta Leber, MD    Family History Family History  Problem Relation Age of Onset  . CAD Mother   . Diabetes Neg Hx   . Stroke Neg Hx     Social History Social History   Tobacco Use  . Smoking status: Never Smoker  . Smokeless tobacco: Never Used  Substance Use Topics  . 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 Drug use: Not Currently    Types: Marijuana    Comment: last used 1 month ago     Allergies   Patient has no known allergies.   Review of Systems Review of Systems  Constitutional: Positive for fatigue. Negative for chills and fever.  Respiratory: Positive for shortness of breath.   Cardiovascular: Negative for chest pain.  Gastrointestinal: Positive for blood in stool and nausea. Negative for abdominal pain, constipation and vomiting.  Genitourinary: Negative for dysuria.    Neurological: Positive for weakness (generalized) and light-headedness. Negative for syncope.  All other systems reviewed and are negative.  Physical Exam Updated Vital Signs BP 115/61 (BP Location: Right Arm)   Pulse 69   Temp 98.7 F (37.1 C) (Oral)   Resp 18   Ht 6\' 1"  (1.854 m)   Wt 54.4 kg   SpO2 95%   BMI 15.83 kg/m   Physical Exam Vitals signs and nursing note reviewed. Exam conducted with a chaperone present.  Constitutional:      General: He is not in acute distress.    Appearance: He is well-developed. He is not toxic-appearing.  HENT:     Head: Normocephalic and atraumatic.  Eyes:     General:        Right eye: No discharge.        Left eye: No discharge.     Comments: Conjunctival pallor  Neck:     Musculoskeletal: Neck supple.  Cardiovascular:     Rate and Rhythm: Regular rhythm. Tachycardia present.  Pulmonary:     Effort: Pulmonary effort is normal. No respiratory distress.     Breath sounds: Normal breath sounds. No wheezing, rhonchi or rales.  Abdominal:     General: There is no distension.     Palpations: Abdomen is soft.     Tenderness: There is no abdominal tenderness. There is no guarding or rebound.  Genitourinary:    Rectum: Guaiac result positive.     Comments: No obvious bleeding or gross melena.  Skin:    General: Skin is warm and dry.     Findings: No rash.  Neurological:     General: No focal deficit present.     Mental Status: He is alert.     Comments: Clear speech.   Psychiatric:        Behavior: Behavior normal.    ED Treatments / Results  Labs (all labs ordered are listed, but only abnormal results are displayed) Labs Reviewed  CBC - Abnormal; Notable for the following components:      Result Value   WBC 14.8 (*)    RBC 1.35 (*)    Hemoglobin 4.3 (*)    HCT 13.8 (*)    MCV 102.2 (*)    RDW 17.1 (*)    nRBC 0.3 (*)    All other components within normal limits  COMPREHENSIVE METABOLIC PANEL - Abnormal; Notable for the  following components:   Potassium 3.4 (*)    Chloride 112 (*)    CO2 20 (*)    Glucose, Bld 126 (*)    Calcium 8.1 (*)    Total Protein 4.8 (*)    Albumin 2.9 (*)    Alkaline Phosphatase 32 (*)    All other components within normal limits  URINALYSIS, ROUTINE W REFLEX MICROSCOPIC  CBG MONITORING, ED  POC OCCULT BLOOD, ED  PREPARE RBC (CROSSMATCH)  TYPE AND SCREEN  ABO/RH    EKG None  Radiology No results found.  Procedures Procedures (including critical care time)  CRITICAL CARE Performed by: Kennith Maes   Total critical care time: 35 minutes  Critical care time was exclusive of separately billable procedures and treating other patients.  Critical care was necessary to treat or prevent imminent or life-threatening deterioration.  Critical care was time spent personally by me on the following activities: development of treatment plan with patient and/or surrogate as well as nursing, discussions with consultants, evaluation of patient's response to treatment, examination of patient, obtaining history from patient or surrogate, ordering and performing treatments and interventions, ordering and review of laboratory studies, ordering and review of radiographic studies, pulse oximetry and re-evaluation of patient's condition.  Medications Ordered in ED Medications  sodium chloride flush (NS) 0.9 % injection 3 mL (has no administration in time range)  pantoprazole (PROTONIX) 80 mg in sodium chloride 0.9 % 100 mL IVPB (has no administration in time range)  pantoprazole (PROTONIX) 80 mg in sodium chloride 0.9 % 250 mL (0.32 mg/mL) infusion (has no administration in time range)  pantoprazole (PROTONIX) injection 40 mg (has no administration in time range)  0.9 %  sodium chloride infusion (Manually program via Guardrails IV Fluids) (has no administration in time range)     Initial Impression / Assessment and Plan / ED Course  I have reviewed the triage vital signs and  the nursing notes.  Pertinent labs & imaging results that were available during my care of the patient were reviewed by me and considered in my medical decision making (see chart for details).   Patient with recent admission for GI bleed, underwent EGD which revealed LA grade A esophagitis, no intervention or active bleeding at time of EGD, did require transfusions throughout his stay, discharged home with hgb/hct of 8.3/23.5. Returns to ER w/ generalized weakness, lightheadedness, and dyspnea with an episode of profuse melena. On exam he is mildly tachycardic, pressures 110/58. Abdomen nontender. Rectal exam without obvious melena, not much stool content on exam. No bright red blood.   Work-up:  CBC: Hgb/hct critically low at 4.3/13.8.  Leukocytosis at 14.8 CMP: mild electrolyte disturbances. Bun/creatinine WNL.  Fecal occult positive.   Protonix, fluids, and 2 units of blood ordered. Will discuss w/ GI, patient will require re-admission.   Initially discussed with gastroenterologist Dr. Benson Norway- given his practice has not seen patient in outpatient setting defers to unassigned.   21:00: CONSULT: Discussed case with gastroenterologist Dr. Henrene Pastor- recommends transfusion of 3 units total, IV PPI therapy, request patient be NPO for possible EGD, and recommends tag red blood cell study if patient continues to bleed throughout the night. I will order additional unit of blood for total of 3 unites and place page to hospitalist service.   21:12: CONSULT: Discussed case with hospitalist Dr. Olevia Bowens who accepts admission.   Findings and plan of care discussed with supervising physician Dr. Maryan Rued who personally evaluated and examined this patient & provided guidance in management & plan for admission.    Final Clinical Impressions(s) / ED Diagnoses   Final diagnoses:  Acute GI bleeding  Anemia, unspecified type    ED Discharge Orders    None       Amaryllis Dyke, PA-C 10/03/18 2242     Blanchie Dessert, MD 10/04/18 1806

## 2018-10-03 NOTE — ED Triage Notes (Signed)
Patient states he had an esophageal bleed recently. patient states he was given a blood transfusion.   Today, the patient states he had a diarrheal stool with burgundy-colored blood and c/o feeling weak.

## 2018-10-03 NOTE — ED Notes (Signed)
Date and time results received: 10/03/18 18:55  Test: Hgb Critical Value: 4.3   Name of Provider Notified: Dr. Maryan Rued  Orders Received? Or Actions Taken?: Continue to monitor and await for new orders.

## 2018-10-03 NOTE — ED Notes (Signed)
Edger Husain, PA made aware of positive occult results @ 1954.

## 2018-10-03 NOTE — ED Notes (Signed)
Bed: [facility] Expected date:  Expected time:  Means of arrival:  Comments: 

## 2018-10-03 NOTE — ED Notes (Signed)
Pt again verbally consented to blood transfusion. Signature pad would not work, signed as witness for pt.

## 2018-10-03 NOTE — ED Notes (Signed)
Verbal consent gathered for blood transfusion. Written consent to be gathered later.

## 2018-10-03 NOTE — H&P (Signed)
History and Physical    CORDARRELL SANE JJH:[id number] DOB: [birth year] DOA: 10/03/2018  PCP: Gildardo Pounds, NP   Patient coming from: Home.  I have personally briefly reviewed patient's old medical records in [place]  Chief Complaint: [place] stools.  HPI: Lee Greer is a 53 y.o. male with medical history significant of AKI due to dehydration, type 2 diabetes, esophageal tear, recent UGI bleed, gastroparesis, moderate malnutrition who is coming to the emergency department complaining that he has been having diarrhea with burgundy colored stools associated with dizziness, mild dyspnea and weakness.  He was recently admitted and discharged from 1/21-0 1/23 due to upper GI bleed.EGD performed showed grade a esophagitis without any active bleeding.  He was doing well after discharge.  Return to work yesterday with major difficulty.  However today at work he was not feeling well.  He was dyspneic and weak.  He went home and became warm and diaphoretic.  He subsequently proceeded to take a half bath to alleviate his symptoms.  While in the [place], the patient try to pass flatus, but then noticed he had burgundy stools.  There is no dysuria, frequency or hematuria.  He denies fever, chills, sore throat, rhinorrhea, hemoptysis, chest pain, palpitations, dizziness, diaphoresis, PND or lower extremity edema.  No polyuria, polydipsia, polyphagia or blurred vision.  Denies skin rashes or pruritus.  ED Course: Initial vital signs temperature 98.5 F, pulse 101, respiration 14, blood pressure 107/58 mmHg and O2 sat 98% on room air.  The patient was started on a continuous Protonix infusion and 30 units of PRBC transfusion was ordered.  His white count was 14.8, hemoglobin 4.3 g/dL and platelets 219.  Sodium is 141, potassium 3.4, chloride 112 and CO2 20 mmol/L.  BUN 14, creatinine 0.8, glucose 126 and calcium 8.1 mg/dL.  Total protein was 4.8 and albumin 2.9 g/dL.  Total bilirubin and  transaminases were unremarkable fecal occult blood was positive.   Review of Systems: As per HPI otherwise 10 point review of systems negative.   Past Medical History:  Diagnosis Date  . Acute kidney injury ([place]) 04/13/2017   related to "dehydration" (04/14/2017)  . Diabetes mellitus without complication ([place])   . Esophageal tear   . Gastroparesis     Past Surgical History:  Procedure Laterality Date  . ESOPHAGOGASTRODUODENOSCOPY (EGD) WITH PROPOFOL N/A 09/27/2018   Procedure: ESOPHAGOGASTRODUODENOSCOPY (EGD) WITH PROPOFOL;  Surgeon: Juanita Craver, MD;  Location[facility] END[facility]COPY;  Service: Endoscopy;  Laterality: N/A;     reports that he has never smoked. He has never used smokeless tobacco. He reports previous drug use. Drug: Marijuana. He reports that he does not drink alcohol.  No Known Allergies  Family History  Problem Relation Age of Onset  . CAD Mother   . Diabetes Neg Hx   . Stroke Neg Hx    Prior to Admission medications   Medication Sig Start Date End Date Taking? Authorizing Provider  ferrous sulfate 325 (65 FE) MG tablet Take 1 tablet (325 mg total) by mouth 2 (two) times daily with a meal. 09/28/18  Yes Ghimire, Henreitta Leber, MD  KLOR-CON M10 10 MEQ tablet Take 10 mEq by mouth daily. 09/19/18  Yes [provider]  Multiple Vitamin (MULTIVITAMIN WITH MINERALS) TABS tablet Take 1 tablet by mouth daily.   Yes [provider]  pantoprazole (PROTONIX) 40 MG tablet Take 1 tablet (40 mg total) by mouth 2 (two) times daily. 09/28/18 09/28/19 Yes Ghimire, Henreitta Leber, MD  sucralfate (CARAFATE) 1 GM/10ML suspension Take 10 mLs (1 g total) by mouth 4 (four) times daily -  with meals and at bedtime. 09/28/18  Yes Jonetta Osgood, MD    Physical Exam: Vitals:   10/03/18 1754 10/03/18 1800 10/03/18 2023 10/03/18 2050  BP:  115/61 (!) 107/58 (!) 120/57  Pulse:  69 (!) 101 (!) 101  Resp:  18 14 15   Temp:  98.7 F (37.1 C) 98.5 F (36.9 C) 98.2 F (36.8 C)  TempSrc:   Oral Oral Oral  SpO2:  95% 98%   Weight:  54.4 kg    Height: 6\' 1"  (1.854 m) 6\' 1"  (1.854 m)      Constitutional: NAD, calm, comfortable Eyes: PERRL, lids and conjunctivae are pale. ENMT: Mucous membranes are moist. Posterior pharynx clear of any exudate or lesions. Neck: normal, supple, no masses, no thyromegaly Respiratory: clear to auscultation bilaterally, no wheezing, no crackles. Normal respiratory effort. No accessory muscle use.  Cardiovascular: Regular rate and rhythm, no murmurs / rubs / gallops. No extremity edema. 2+ pedal pulses. No carotid bruits.  Abdomen: Soft, no tenderness, no masses palpated. No hepatosplenomegaly. Bowel sounds positive.  Musculoskeletal: no clubbing / cyanosis. No gross joint deformity upper and lower extremities. Good ROM, no contractures. Normal muscle tone.  Skin: no rashes, lesions, ulcers. No induration on limited hematological examination. Neurologic: CN 2-12 grossly intact. Sensation intact, DTR normal. Strength 5/5 in all 4.  Psychiatric: Normal judgment and insight. Alert and oriented x 3. Normal mood.   Labs on Admission: I have personally reviewed following labs and imaging studies  CBC: Recent Labs  Lab 09/27/18 0702 09/27/18 1657 09/28/18 0435 10/03/18 1831  WBC  --  12.8* 11.2* 14.8*  HGB 8.6* 8.9* 8.2* 4.3*  HCT 25.3* 26.3* 23.5* 13.8*  MCV  --  88.9 88.0 102.2*  PLT  --  165 170 322   Basic Metabolic Panel: Recent Labs  Lab 09/28/18 0435 10/03/18 1831  NA 138 141  K 3.4* 3.4*  CL 110 112*  CO2 20* 20*  GLUCOSE 94 126*  BUN 10 14  CREATININE 0.92 0.80  CALCIUM 7.8* 8.1*   GFR: Estimated Creatinine Clearance: 84.1 mL/min (by C-G formula based on SCr of 0.8 mg/dL). Liver Function Tests: Recent Labs  Lab 10/03/18 1831  AST 18  ALT 13  ALKPHOS 32*  BILITOT 0.4  PROT 4.8*  ALBUMIN 2.9*   No results for input(s): LIPASE, AMYLASE in the last 168 hours. No results for input(s): AMMONIA in the last 168  hours. Coagulation Profile: No results for input(s): INR, PROTIME in the last 168 hours. Cardiac Enzymes: No results for input(s): CKTOTAL, CKMB, CKMBINDEX, TROPONINI in the last 168 hours. BNP (last 3 results) No results for input(s): PROBNP in the last 8760 hours. HbA1C: No results for input(s): HGBA1C in the last 72 hours. CBG: Recent Labs  Lab 09/27/18 0339 09/27/18 1644 09/28/18 0758  GLUCAP 86 81 87   Lipid Profile: No results for input(s): CHOL, HDL, LDLCALC, TRIG, CHOLHDL, LDLDIRECT in the last 72 hours. Thyroid Function Tests: No results for input(s): TSH, T4TOTAL, FREET4, T3FREE, THYROIDAB in the last 72 hours. Anemia Panel: No results for input(s): VITAMINB12, FOLATE, FERRITIN, TIBC, IRON, RETICCTPCT in the last 72 hours. Urine analysis:    Component Value Date/Time   COLORURINE YELLOW 09/08/2018 1102   APPEARANCEUR CLEAR 09/08/2018 1102   LABSPEC 1.018 09/08/2018 1102   PHURINE 6.0 09/08/2018 1102   GLUCOSEU 50 (A) 09/08/2018 1102  HGBUR MODERATE (A) 09/08/2018 1102   BILIRUBINUR NEGATIVE 09/08/2018 1102   BILIRUBINUR small 02/17/2015 1222   KETONESUR 5 (A) 09/08/2018 1102   PROTEINUR NEGATIVE 09/08/2018 1102   UROBILINOGEN 0.2 02/17/2015 1222   UROBILINOGEN 0.2 08/30/2014 0735   NITRITE NEGATIVE 09/08/2018 1102   LEUKOCYTESUR NEGATIVE 09/08/2018 1102    Radiological Exams on Admission: No results found.  EKG: Independently reviewed.   Assessment/Plan Principal Problem:   UGI bleed Admit to stepdown/inpatient. Keep n.p.o. Continue IV fluids. Continue Protonix infusion. Transfuse 3 units of PRBCs. Monitor hematocrit and hemoglobin. If the bleeding persists will need tagged RBC study. GI will follow in a.m.`  Active Problems:   Malnutrition of moderate degree Hypoproteinemic and hypoalbuminemic. Consider nutritional services evaluation.    Diabetes mellitus without complication (HCC) Currently n.p.o. Monitor CBG.    Hypokalemia Follow-up  potassium level after blood transfusion.    DVT prophylaxis: SCDs. Code Status: Full code. Family Communication:  Disposition Plan: Admit for PRBC transfusion, H&H monitoring and GI evaluation. Consults called: Dr. Henrene Pastor (GI). Admission status: Inpatient/stepdown.   Reubin Milan MD [facility]  10/03/2018, 9:44 PM   This document was prepared using Dragon voice recognition software and may contain some unintended transcription errors.

## 2018-10-04 ENCOUNTER — Encounter (HOSPITAL_COMMUNITY)
Admission: EM | Disposition: A | Discharge status: Home / Self Care | Payer: Self-pay | Source: Home / Self Care | Attending: Family Medicine

## 2018-10-04 ENCOUNTER — Inpatient Hospital Stay (HOSPITAL_COMMUNITY): Payer: Self-pay | Admitting: Certified Registered Nurse Anesthetist

## 2018-10-04 ENCOUNTER — Encounter (HOSPITAL_COMMUNITY): Payer: Self-pay | Admitting: *Deleted

## 2018-10-04 DIAGNOSIS — K250 Acute gastric ulcer with hemorrhage: Secondary | ICD-10-CM

## 2018-10-04 DIAGNOSIS — K2900 Acute gastritis without bleeding: Secondary | ICD-10-CM

## 2018-10-04 DIAGNOSIS — D62 Acute posthemorrhagic anemia: Secondary | ICD-10-CM

## 2018-10-04 DIAGNOSIS — K922 Gastrointestinal hemorrhage, unspecified: Secondary | ICD-10-CM | POA: Diagnosis present

## 2018-10-04 HISTORY — PX: HOT HEMOSTASIS: SHX5433

## 2018-10-04 HISTORY — PX: ESOPHAGOGASTRODUODENOSCOPY (EGD) WITH PROPOFOL: SHX5813

## 2018-10-04 HISTORY — PX: BIOPSY: SHX5522

## 2018-10-04 LAB — PREPARE RBC (CROSSMATCH)

## 2018-10-04 LAB — CBC WITH DIFFERENTIAL/PLATELET
Abs Immature Granulocytes: 0.06 10*3/uL (ref 0.00–0.07)
Basophils Absolute: 0 10*3/uL (ref 0.0–0.1)
Basophils Relative: 0 %
Eosinophils Absolute: 0 10*3/uL (ref 0.0–0.5)
Eosinophils Relative: 0 %
HEMATOCRIT: 18.7 % — AB (ref 39.0–52.0)
HEMOGLOBIN: 5.9 g/dL — AB (ref 13.0–17.0)
Immature Granulocytes: 1 %
LYMPHS ABS: 5 10*3/uL — AB (ref 0.7–4.0)
Lymphocytes Relative: 42 %
MCH: 29.8 pg (ref 26.0–34.0)
MCHC: 31.6 g/dL (ref 30.0–36.0)
MCV: 94.4 fL (ref 80.0–100.0)
MONOS PCT: 10 %
Monocytes Absolute: 1.2 10*3/uL — ABNORMAL HIGH (ref 0.1–1.0)
Neutro Abs: 5.7 10*3/uL (ref 1.7–7.7)
Neutrophils Relative %: 47 %
Platelets: 161 10*3/uL (ref 150–400)
RBC: 1.98 MIL/uL — ABNORMAL LOW (ref 4.22–5.81)
RDW: 17.2 % — ABNORMAL HIGH (ref 11.5–15.5)
WBC: 12 10*3/uL — ABNORMAL HIGH (ref 4.0–10.5)
nRBC: 0.7 % — ABNORMAL HIGH (ref 0.0–0.2)

## 2018-10-04 LAB — COMPREHENSIVE METABOLIC PANEL
ALT: 12 U/L (ref 0–44)
AST: 13 U/L — ABNORMAL LOW (ref 15–41)
Albumin: 2.5 g/dL — ABNORMAL LOW (ref 3.5–5.0)
Alkaline Phosphatase: 31 U/L — ABNORMAL LOW (ref 38–126)
Anion gap: 2 — ABNORMAL LOW (ref 5–15)
BILIRUBIN TOTAL: 1.3 mg/dL — AB (ref 0.3–1.2)
BUN: 20 mg/dL (ref 6–20)
CALCIUM: 7.3 mg/dL — AB (ref 8.9–10.3)
CO2: 21 mmol/L — ABNORMAL LOW (ref 22–32)
CREATININE: 0.68 mg/dL (ref 0.61–1.24)
Chloride: 116 mmol/L — ABNORMAL HIGH (ref 98–111)
GFR calc Af Amer: >60 mL/min (ref >60)
GFR calc non Af Amer: >60 mL/min (ref >60)
Glucose, Bld: 97 mg/dL (ref 70–99)
Potassium: 3.8 mmol/L (ref 3.5–5.1)
Sodium: 139 mmol/L (ref 135–145)
TOTAL PROTEIN: 4.3 g/dL — AB (ref 6.5–8.1)

## 2018-10-04 LAB — CBC
HCT: 24.2 % — ABNORMAL LOW (ref 39.0–52.0)
Hemoglobin: 7.9 g/dL — ABNORMAL LOW (ref 13.0–17.0)
MCH: 29.5 pg (ref 26.0–34.0)
MCHC: 32.6 g/dL (ref 30.0–36.0)
MCV: 90.3 fL (ref 80.0–100.0)
Platelets: 183 10*3/uL (ref 150–400)
RBC: 2.68 MIL/uL — ABNORMAL LOW (ref 4.22–5.81)
RDW: 17.1 % — AB (ref 11.5–15.5)
WBC: 12.5 10*3/uL — ABNORMAL HIGH (ref 4.0–10.5)
nRBC: 0.6 % — ABNORMAL HIGH (ref 0.0–0.2)

## 2018-10-04 LAB — HEMOGLOBIN AND HEMATOCRIT, BLOOD
HCT: 28.7 % — ABNORMAL LOW (ref 39.0–52.0)
Hemoglobin: 9.4 g/dL — ABNORMAL LOW (ref 13.0–17.0)

## 2018-10-04 SURGERY — ESOPHAGOGASTRODUODENOSCOPY (EGD) WITH PROPOFOL
Anesthesia: Monitor Anesthesia Care

## 2018-10-04 MED ORDER — EPINEPHRINE PF 1 MG/10ML IJ SOSY
PREFILLED_SYRINGE | INTRAMUSCULAR | Status: AC
  Filled 2018-10-04: qty 10

## 2018-10-04 MED ORDER — PROPOFOL 500 MG/50ML IV EMUL
INTRAVENOUS | Status: DC | PRN
  Administered 2018-10-04: 120 ug/kg/min via INTRAVENOUS

## 2018-10-04 MED ORDER — ONDANSETRON HCL 4 MG/2ML IJ SOLN
INTRAMUSCULAR | Status: AC
  Filled 2018-10-04: qty 2

## 2018-10-04 MED ORDER — SODIUM CHLORIDE 0.9 % IV SOLN
INTRAVENOUS | Status: DC
  Administered 2018-10-04: 09:00:00 via INTRAVENOUS

## 2018-10-04 MED ORDER — PROPOFOL 10 MG/ML IV BOLUS
INTRAVENOUS | Status: AC
  Filled 2018-10-04: qty 20

## 2018-10-04 MED ORDER — TRAMADOL HCL 50 MG PO TABS
50.0000 mg | ORAL_TABLET | Freq: Three times a day (TID) | ORAL | Status: DC | PRN
  Administered 2018-10-04: 50 mg via ORAL
  Filled 2018-10-04: qty 1

## 2018-10-04 MED ORDER — FENTANYL CITRATE (PF) 100 MCG/2ML IJ SOLN
INTRAMUSCULAR | Status: AC
  Filled 2018-10-04: qty 2

## 2018-10-04 MED ORDER — FENTANYL CITRATE (PF) 100 MCG/2ML IJ SOLN
50.0000 ug | Freq: Once | INTRAMUSCULAR | Status: AC
  Administered 2018-10-04: 50 ug via INTRAVENOUS

## 2018-10-04 MED ORDER — PROPOFOL 10 MG/ML IV BOLUS
INTRAVENOUS | Status: DC | PRN
  Administered 2018-10-04 (×3): 20 mg via INTRAVENOUS

## 2018-10-04 MED ORDER — LACTATED RINGERS IV SOLN
INTRAVENOUS | Status: DC
  Administered 2018-10-04: 13:00:00 via INTRAVENOUS

## 2018-10-04 MED ORDER — PROPOFOL 10 MG/ML IV BOLUS
INTRAVENOUS | Status: AC
  Filled 2018-10-04: qty 40

## 2018-10-04 MED ORDER — ONDANSETRON HCL 4 MG PO TABS: 4.0000 mg | ORAL_TABLET | Freq: Three times a day (TID) | ORAL | Status: DC | PRN

## 2018-10-04 MED ORDER — ZOLPIDEM TARTRATE 5 MG PO TABS
5.0000 mg | ORAL_TABLET | Freq: Every evening | ORAL | Status: DC | PRN
  Administered 2018-10-04 – 2018-10-06 (×2): 5 mg via ORAL
  Filled 2018-10-04 (×2): qty 1

## 2018-10-04 MED ORDER — ACETAMINOPHEN 325 MG PO TABS: 650.0000 mg | ORAL_TABLET | Freq: Three times a day (TID) | ORAL | Status: DC | PRN

## 2018-10-04 MED ORDER — PROCHLORPERAZINE EDISYLATE 10 MG/2ML IJ SOLN
5.0000 mg | Freq: Four times a day (QID) | INTRAMUSCULAR | Status: DC | PRN
  Administered 2018-10-04: 5 mg via INTRAVENOUS
  Filled 2018-10-04: qty 2

## 2018-10-04 MED ORDER — SODIUM CHLORIDE (PF) 0.9 % IJ SOLN
PREFILLED_SYRINGE | INTRAMUSCULAR | Status: DC | PRN
  Administered 2018-10-04: 4 mL

## 2018-10-04 SURGICAL SUPPLY — 15 items

## 2018-10-04 NOTE — ED Notes (Signed)
ED TO INPATIENT HANDOFF REPORT  Name/Age/Gender Lee Greer 53 y.o. male  Code Status    Code Status Orders  (From admission, onward)         Start     Ordered   10/03/18 2114  Full code  Continuous     10/03/18 2114        Code Status History    Date Active Date Inactive Code Status Order ID Comments User Context   09/26/2018 0308 09/28/2018 1756 Full Code [id number]  Etta Quill, DO ED   09/16/2017 1835 09/17/2017 1703 Full Code [id number]  Phillips Grout, MD Inpatient   04/14/2017 0319 04/14/2017 2125 Full Code [id number]  Toy Baker, MD ED      Home/SNF/Other Home  Chief Complaint GI bleed  Level of Care/Admitting Diagnosis ED Disposition    ED Disposition Condition [facility] [facility] Area: [place] [facility] [100102]  Level of Care: Telemetry [5]  Admit to tele based on following criteria: Eval of Syncope  Diagnosis: Upper GI bleed [546503]  Admitting Physician: [place], [place]  Attending Physician: Nita Sells [phone]  Estimated length of stay: 3 - 4 days  Certification:: I certify this patient will need inpatient services for at least 2 midnights  PT Class (Do Not Modify): Inpatient [101]  PT Acc Code (Do Not Modify): Private [1]       Medical History Past Medical History:  Diagnosis Date  . Acute kidney injury ([place]) 04/13/2017   related to "dehydration" (04/14/2017)  . Diabetes mellitus without complication ([place])   . Esophageal tear   . Gastroparesis     Allergies No Known Allergies  IV Location/Drains/Wounds Patient Lines/Drains/Airways Status   Active Line/Drains/Airways    Name:   Placement date:   Placement time:   Site:   Days:   Peripheral IV 10/03/18 Right Forearm   10/03/18    1931    Forearm   1   Peripheral IV 10/03/18 Left Forearm   10/03/18    1931    Forearm   1          Labs/Imaging Results for orders placed or performed during the hospital encounter of 10/03/18 (from the  past 48 hour(s))  ABO/Rh     Status: None   Collection Time: 10/03/18  6:28 PM  Result Value Ref Range   ABO/RH(D)      A NEG Performed at [facility], [place] [address] [place], [place] [address]   CBC     Status: Abnormal   Collection Time: 10/03/18  6:31 PM  Result Value Ref Range   WBC 14.8 (H) 4.0 - 10.5 K/uL   RBC 1.35 (L) 4.22 - 5.81 MIL/uL   Hemoglobin 4.3 (LL) 13.0 - 17.0 g/dL    Comment: REPEATED TO VERIFY THIS CRITICAL RESULT HAS VERIFIED AND BEEN CALLED TO J INMAN,RN BY MARVIN SCOTTON ON 01 28 2020 AT 1857, AND HAS BEEN READ BACK.     HCT 13.8 (L) 39.0 - 52.0 %   MCV 102.2 (H) 80.0 - 100.0 fL   MCH 31.9 26.0 - 34.0 pg   MCHC 31.2 30.0 - 36.0 g/dL   RDW 17.1 (H) 11.5 - 15.5 %   Platelets 219 150 - 400 K/uL   nRBC 0.3 (H) 0.0 - 0.2 %    Comment: Performed at [facility], [place] [address] [place], [place] [address]  Comprehensive metabolic panel     Status: Abnormal  Collection Time: 10/03/18  6:31 PM  Result Value Ref Range   Sodium 141 135 - 145 mmol/L   Potassium 3.4 (L) 3.5 - 5.1 mmol/L   Chloride 112 (H) 98 - 111 mmol/L   CO2 20 (L) 22 - 32 mmol/L   Glucose, Bld 126 (H) 70 - 99 mg/dL   BUN 14 6 - 20 mg/dL   Creatinine, Ser 0.80 0.61 - 1.24 mg/dL   Calcium 8.1 (L) 8.9 - 10.3 mg/dL   Total Protein 4.8 (L) 6.5 - 8.1 g/dL   Albumin 2.9 (L) 3.5 - 5.0 g/dL   AST 18 15 - 41 U/L   ALT 13 0 - 44 U/L   Alkaline Phosphatase 32 (L) 38 - 126 U/L   Total Bilirubin 0.4 0.3 - 1.2 mg/dL   GFR calc non Af Amer >60 >60 mL/min   GFR calc Af Amer >60 >60 mL/min   Anion gap 9 5 - 15    Comment: Performed at [facility], [place] [address] [place], [place] [address]  Type and screen [place]     Status: None (Preliminary result)   Collection Time: 10/03/18  6:31 PM  Result Value Ref Range   ABO/RH(D) A NEG    Antibody Screen NEG    Sample Expiration      10/06/2018 Performed at [facility], [place] [address] [place], [place] [address]    Unit Number [id number]    Blood Component Type RED CELLS,LR    Unit division 00    Status of Unit ISSUED,FINAL    Transfusion Status OK TO TRANSFUSE    Crossmatch Result Compatible    Unit Number [id number]    Blood Component Type RBC LR PHER2    Unit division 00    Status of Unit ISSUED,FINAL    Transfusion Status OK TO TRANSFUSE    Crossmatch Result Compatible    Unit Number [id number]    Blood Component Type RED CELLS,LR    Unit division 00    Status of Unit ISSUED    Transfusion Status OK TO TRANSFUSE    Crossmatch Result Compatible    Unit Number [id number]    Blood Component Type RED CELLS,LR    Unit division 00    Status of Unit ALLOCATED    Transfusion Status OK TO TRANSFUSE    Crossmatch Result Compatible    Unit Number [id number]    Blood Component Type RBC LR PHER1    Unit division 00    Status of Unit ALLOCATED    Transfusion Status OK TO TRANSFUSE    Crossmatch Result Compatible   Prepare RBC     Status: None   Collection Time: 10/03/18  7:16 PM  Result Value Ref Range   Order Confirmation      ORDER PROCESSED BY BLOOD BANK Performed at [facility], [place] [address] [place], [place] [address]   POC occult blood, ED     Status: Abnormal   Collection Time: 10/03/18  7:52 PM  Result Value Ref Range   Fecal Occult Bld POSITIVE (A) NEGATIVE  Prepare RBC     Status: None   Collection Time: 10/03/18  9:54 PM  Result Value Ref Range   Order Confirmation      ORDER PROCESSED BY BLOOD BANK Performed at [place] [address] [place], [place] [address]   CBG monitoring, ED     Status: Abnormal   Collection Time: 10/03/18  10:00 PM  Result Value Ref Range   Glucose-Capillary 114 (H) 70 - 99 mg/dL  Urinalysis, Routine w reflex microscopic     Status: None   Collection Time: 10/03/18 11:10 PM  Result Value Ref Range   Color, Urine YELLOW  YELLOW   APPearance CLEAR CLEAR   Specific Gravity, Urine 1.015 1.005 - 1.030   pH 6.0 5.0 - 8.0   Glucose, UA NEGATIVE NEGATIVE mg/dL   Hgb urine dipstick NEGATIVE NEGATIVE   Bilirubin Urine NEGATIVE NEGATIVE   Ketones, ur NEGATIVE NEGATIVE mg/dL   Protein, ur NEGATIVE NEGATIVE mg/dL   Nitrite NEGATIVE NEGATIVE   Leukocytes, UA NEGATIVE NEGATIVE   RBC / HPF 0-5 0 - 5 RBC/hpf   WBC, UA 0-5 0 - 5 WBC/hpf   Bacteria, UA NONE SEEN NONE SEEN   Squamous Epithelial / LPF 0-5 0 - 5   Mucus PRESENT    Hyaline Casts, UA PRESENT     Comment: Performed at [facility], [place] [address] [place], [place] [address]  CBC WITH DIFFERENTIAL     Status: Abnormal   Collection Time: 10/04/18  2:42 AM  Result Value Ref Range   WBC 12.0 (H) 4.0 - 10.5 K/uL   RBC 1.98 (L) 4.22 - 5.81 MIL/uL   Hemoglobin 5.9 (LL) 13.0 - 17.0 g/dL    Comment: REPEATED TO VERIFY POST TRANSFUSION SPECIMEN CRITICAL VALUE NOTED.  VALUE IS CONSISTENT WITH PREVIOUSLY REPORTED AND CALLED VALUE. DELTA CHECK NOTED    HCT 18.7 (L) 39.0 - 52.0 %   MCV 94.4 80.0 - 100.0 fL    Comment: POST TRANSFUSION SPECIMEN REPEATED TO VERIFY DELTA CHECK NOTED    MCH 29.8 26.0 - 34.0 pg   MCHC 31.6 30.0 - 36.0 g/dL   RDW 17.2 (H) 11.5 - 15.5 %   Platelets 161 150 - 400 K/uL   nRBC 0.7 (H) 0.0 - 0.2 %   Neutrophils Relative % 47 %   Neutro Abs 5.7 1.7 - 7.7 K/uL   Lymphocytes Relative 42 %   Lymphs Abs 5.0 (H) 0.7 - 4.0 K/uL   Monocytes Relative 10 %   Monocytes Absolute 1.2 (H) 0.1 - 1.0 K/uL   Eosinophils Relative 0 %   Eosinophils Absolute 0.0 0.0 - 0.5 K/uL   Basophils Relative 0 %   Basophils Absolute 0.0 0.0 - 0.1 K/uL   Immature Granulocytes 1 %   Abs Immature Granulocytes 0.06 0.00 - 0.07 K/uL    Comment: Performed at [facility], [place] [address] [place], [place] [address]  Comprehensive metabolic panel     Status: Abnormal   Collection Time: 10/04/18  2:42 AM  Result Value Ref Range    Sodium 139 135 - 145 mmol/L   Potassium 3.8 3.5 - 5.1 mmol/L   Chloride 116 (H) 98 - 111 mmol/L   CO2 21 (L) 22 - 32 mmol/L   Glucose, Bld 97 70 - 99 mg/dL   BUN 20 6 - 20 mg/dL   Creatinine, Ser 0.68 0.61 - 1.24 mg/dL   Calcium 7.3 (L) 8.9 - 10.3 mg/dL   Total Protein 4.3 (L) 6.5 - 8.1 g/dL   Albumin 2.5 (L) 3.5 - 5.0 g/dL   AST 13 (L) 15 - 41 U/L   ALT 12 0 - 44 U/L   Alkaline Phosphatase 31 (L) 38 - 126 U/L   Total Bilirubin 1.3 (H) 0.3 - 1.2 mg/dL   GFR calc non Af Amer >60 >60 mL/min  GFR calc Af Amer >60 >60 mL/min   Anion gap 2 (L) 5 - 15    Comment: REPEATED TO VERIFY Performed at [facility], [place] [address] [place], [place] [address]   CBC     Status: Abnormal   Collection Time: 10/04/18  9:21 AM  Result Value Ref Range   WBC 12.5 (H) 4.0 - 10.5 K/uL   RBC 2.68 (L) 4.22 - 5.81 MIL/uL   Hemoglobin 7.9 (L) 13.0 - 17.0 g/dL    Comment: REPEATED TO VERIFY POST TRANSFUSION SPECIMEN DELTA CHECK NOTED    HCT 24.2 (L) 39.0 - 52.0 %   MCV 90.3 80.0 - 100.0 fL   MCH 29.5 26.0 - 34.0 pg   MCHC 32.6 30.0 - 36.0 g/dL   RDW 17.1 (H) 11.5 - 15.5 %   Platelets 183 150 - 400 K/uL   nRBC 0.6 (H) 0.0 - 0.2 %    Comment: Performed at [facility], [place] [address], [place], [place] [address]  Prepare RBC     Status: None   Collection Time: 10/04/18 11:03 AM  Result Value Ref Range   Order Confirmation      ORDER PROCESSED BY BLOOD BANK Performed at [facility], [place] [address] [place], [place] [address]    No results found. None  Pending Labs Unresulted Labs (From admission, onward)    Start     Ordered   10/05/18 0500  CBC with Differential/Platelet  Tomorrow morning,   R    Question:  Specimen collection method  Answer:  IV Team=IV Team collect   10/04/18 0852   10/05/18 4097  Basic metabolic panel  Tomorrow morning,   R    Question:  Specimen collection method  Answer:  IV Team=IV Team collect   10/04/18  0852   10/04/18 1300  Hemoglobin and hematocrit, blood  Once,   R    Question:  Specimen collection method  Answer:  IV Team=IV Team collect   10/04/18 0852          Vitals/Pain Today's Vitals   10/04/18 1015 10/04/18 1030 10/04/18 1045 10/04/18 1100  BP: 121/73 127/64 124/73 120/67  Pulse: 77 82 84 80  Resp: 15 13 15 15   Temp:      TempSrc:      SpO2: 100% 100% 100% 100%  Weight:      Height:      PainSc:        Isolation Precautions No active isolations  Medications Medications  pantoprazole (PROTONIX) 80 mg in sodium chloride 0.9 % 250 mL (0.32 mg/mL) infusion (8 mg/hr Intravenous New Bag/Given 10/04/18 0759)  pantoprazole (PROTONIX) injection 40 mg (has no administration in time range)  ondansetron (ZOFRAN) tablet 4 mg (has no administration in time range)    Or  ondansetron (ZOFRAN) injection 4 mg (has no administration in time range)  acetaminophen (TYLENOL) tablet 650 mg (has no administration in time range)    Or  acetaminophen (TYLENOL) suppository 650 mg (has no administration in time range)  0.9 %  sodium chloride infusion ( Intravenous New Bag/Given 10/04/18 0922)  sodium chloride flush (NS) 0.9 % injection 3 mL (3 mLs Intravenous Given 10/03/18 2034)  pantoprazole (PROTONIX) 80 mg in sodium chloride 0.9 % 100 mL IVPB (0 mg Intravenous Stopped 10/03/18 2104)  0.9 %  sodium chloride infusion (Manually program via Guardrails IV Fluids) ( Intravenous Stopped 10/04/18 0607)  0.9 %  sodium chloride infusion (0 mL/hr Intravenous  Stopped 10/04/18 0145)  sodium chloride 0.9 % bolus 1,000 mL (0 mLs Intravenous Stopped 10/04/18 0136)  LORazepam (ATIVAN) injection 0.75 mg (0.75 mg Intravenous Given 10/04/18 0030)    Mobility walks with person assist

## 2018-10-04 NOTE — ED Notes (Signed)
Pt transported  Endo, [name] given report on 5th floor. Pt is alert and oriented x 4 and is verbally responsive. Pt denies pain at this time.

## 2018-10-04 NOTE — Transfer of Care (Signed)
Immediate Anesthesia Transfer of Care Note  Patient: Lee Greer  Procedure(s) Performed: ESOPHAGOGASTRODUODENOSCOPY (EGD) WITH PROPOFOL (N/A ) HEMOSTASIS CONTROL HOT HEMOSTASIS (ARGON PLASMA COAGULATION/BICAP) (N/A ) BIOPSY  Patient Location: PACU and Endoscopy Unit  Anesthesia Type:MAC  Level of Consciousness: awake, alert , oriented and patient cooperative  Airway & Oxygen Therapy: Patient Spontanous Breathing and Patient connected to nasal cannula oxygen  Post-op Assessment: Report given to RN, Post -op Vital signs reviewed and stable and Patient moving all extremities  Post vital signs: Reviewed and stable  Last Vitals:  Vitals Value Taken Time  BP 146/91 10/04/2018  2:00 PM  Temp    Pulse 84 10/04/2018  2:00 PM  Resp 14 10/04/2018  2:00 PM  SpO2 100 % 10/04/2018  2:00 PM  Vitals shown include unvalidated device data.  Last Pain:  Vitals:   10/04/18 1230  TempSrc: Oral  PainSc: 0-No pain         Complications: No apparent anesthesia complications

## 2018-10-04 NOTE — Progress Notes (Signed)
Call placed to Dr. Valma Cava MDA to inform him BP 171/110 at this time pre procedure BP 107/64. Patient is ready for transfer to 1508 post EGD. Advised patient received epi injection in gastric submucosa for hemostasis and also that patient is emotionally upset at this time. Per Dr Valma Cava patient ok to transfer to his room at this time.

## 2018-10-04 NOTE — Progress Notes (Signed)
[facility] PROGRESS NOTE  DIONIS AUTRY ZOX:[id number] DOB: 1966-07-07 DOA: 10/03/2018 PCP: Gildardo Pounds, NP   Narrative: 7 aam  Known Cannabinoid hyperemesis syndrome, prior abn TFT Recent admission ending 1/23 for ABLA from probable Mallory-Weiss tear--EGd that time showed Grade A esophagitis--at that admission as well needed IV as was signi Dehydrated with vol depletion Re-admit with weakness and burgundy brown sool-  Hemoglobin 4 lytes ok GI contacted-rec admission-received 2 U prbc and a third ordered   A & Plan Probable upper GI bleed Multiple episodes of unformed dark stool at home 1/28-GI to evaluate has not been taking Goody's or any other risky for bleeding has been compliant with meds (report Anemia of acute blood loss superimposed on normocytic anemia Received transfusions recheck labs again about 1 PM if still low working another unit of blood Cannabinoid hyperemesis syndrome Has not smoked since he left the hospital Prior abnormal TFT Interval follow-up required    SCD, inpatient, await gastroenterology input, not ready for discharge at this time-okay for telemetry bed   Verlon Au, MD  [facility] Via amion app OR [email or web address] 7PM-7AM contact night coverage as above 10/04/2018, 7:17 AM  LOS: 1 day   Consultants:  Gastroenterology  Procedures:  Transfused x3 so far  Antimicrobials:  None  Interval history/Subjective: Awake alert pleasant no distress seems scared No distress No further vomiting or dark stool Relates diaphoresis and weakness around the time of loose stool none since admission No chest pain  Objective:  Vitals:  Vitals:   10/04/18 0603 10/04/18 0716  BP: 115/77 112/74  Pulse: 83 78  Resp: 15 14  Temp: 98.7 F (37.1 C)   SpO2: 100% 100%    Exam:  Awake alert pleasant no distress Right NCAT no icterus slightly anxious Chest clinically clear S1-S2 no murmur although tachycardic abdomen is soft no  rebound no guarding No lower extremity edema   I have personally reviewed the following:  DATA   Labs:  Hemoglobin 5.9 up from 4.8 on admission  WBC 12.0   Scheduled Meds: . [START ON 10/07/2018] pantoprazole  40 mg Intravenous Q12H   Continuous Infusions: . pantoprozole (PROTONIX) infusion Stopped (10/04/18 0715)    Principal Problem:   UGI bleed Active Problems:   Malnutrition of moderate degree   Diabetes mellitus without complication (HCC)   Hypokalemia   LOS: 1 day

## 2018-10-04 NOTE — Op Note (Signed)
[facility] Patient Name: Lee Greer Procedure Date: 10/04/2018 MRN: [id number] Attending MD: Jerene Bears , MD Date of Birth: 1966-07-07 CSN: [id number] Age: 53 Admit Type: Inpatient Procedure:                Upper GI endoscopy Indications:              Acute post hemorrhagic anemia, Melena, Suspected                            upper gastrointestinal bleeding Providers:                Lajuan Lines. Hilarie Fredrickson, MD, Carlyn Reichert, RN, Elspeth Cho                            Tech., Technician Referring MD:             [facility] Medicines:                Monitored Anesthesia Care Complications:            No immediate complications. Estimated Blood Loss:     Estimated blood loss was minimal. Procedure:                Pre-Anesthesia Assessment:                           - Prior to the procedure, a History and Physical                            was performed, and patient medications and                            allergies were reviewed. The patient's tolerance of                            previous anesthesia was also reviewed. The risks                            and benefits of the procedure and the sedation                            options and risks were discussed with the patient.                            All questions were answered, and informed consent                            was obtained. Prior Anticoagulants: The patient has                            taken no previous anticoagulant or antiplatelet                            agents. ASA Grade Assessment: II - A patient with  mild systemic disease. After reviewing the risks                            and benefits, the patient was deemed in                            satisfactory condition to undergo the procedure.                           After obtaining informed consent, the endoscope was                            passed under direct vision. Throughout the                procedure, the patient's blood pressure, pulse, and                            oxygen saturations were monitored continuously. The                            GIF-H190 ([id number]) Olympus gastroscope was                            introduced through the mouth, and advanced to the                            second part of duodenum. The upper GI endoscopy was                            accomplished without difficulty. The patient                            tolerated the procedure well. Scope In: Scope Out: Findings:      The examined esophagus was normal.      One cratered gastric ulcer with a visible vessel was found at the       incisura. The lesion was 15 mm in largest dimension. Area around the       ulcer was successfully injected with 1 mL injections (totalling 3 mL) of       a 1:10,000 solution of epinephrine for hemostasis. Coagulation for       hemostasis using bipolar probe was successful.      Diffuse moderate inflammation characterized by congestion (edema) and       granularity was found in the gastric antrum. Mucosa with appearing of       gastric metaplasia in the antrum and prepylorus. Biopsies were taken       with a cold forceps for histology and Helicobacter pylori testing.      The examined duodenum was normal. Impression:               - Normal esophagus.                           - Gastric ulcer with a visible vessel. Injected.  Treated with bipolar cautery.                           - Gastritis. Biopsied.                           - Normal examined duodenum. Moderate Sedation:      N/A Recommendation:           - Return patient to hospital ward for ongoing care.                           - Clear liquid diet today.                           - Continue present medications.                           - Continue PPI infusion x 72 hours, then change in                            PO BID for at least 8 weeks.                            - No aspirin, ibuprofen, naproxen, or other                            non-steroidal anti-inflammatory drugs for at least                            8 weeks.                           - Await pathology results. High suspicion for H.                            Pylori and if positive will need appropriate                            antibiotic therapy.                           - Repeat upper endoscopy in 12 weeks to check                            healing. Procedure Code(s):        --- Professional ---                           23536, 59, Esophagogastroduodenoscopy, flexible,                            transoral; with control of bleeding, any method                           43239, Esophagogastroduodenoscopy, flexible,  transoral; with biopsy, single or multiple Diagnosis Code(s):        --- Professional ---                           K25.4, Chronic or unspecified gastric ulcer with                            hemorrhage                           K29.70, Gastritis, unspecified, without bleeding                           D62, Acute posthemorrhagic anemia                           K92.1, Melena (includes Hematochezia) CPT copyright 2018 American Medical Association. All rights reserved. The codes documented in this report are preliminary and upon coder review may  be revised to meet current compliance requirements. Jerene Bears, MD 10/04/2018 2:08:46 PM This report has been signed electronically. Number of Addenda: 0

## 2018-10-04 NOTE — Anesthesia Preprocedure Evaluation (Addendum)
Anesthesia Evaluation  Patient identified by MRN, date of birth, ID band Patient awake    Reviewed: Allergy & Precautions, NPO status , Patient's Chart, lab work & pertinent test results  Airway Mallampati: I  TM Distance: >3 FB Neck ROM: Full    Dental  (+) Teeth Intact, Dental Advisory Given   Pulmonary neg pulmonary ROS,    Pulmonary exam normal breath sounds clear to auscultation       Cardiovascular Exercise Tolerance: Good negative cardio ROS Normal cardiovascular exam Rhythm:Regular Rate:Normal     Neuro/Psych negative neurological ROS     GI/Hepatic Neg liver ROS, Gastroparesis Hematemesis and anemia   Endo/Other  diabetes, Type 2  Renal/GU Renal disease     Musculoskeletal negative musculoskeletal ROS (+)   Abdominal Normal abdominal exam  (+)   Peds  Hematology  (+) Blood dyscrasia, anemia ,   Anesthesia Other Findings Day of surgery medications reviewed with the patient.  Reproductive/Obstetrics                             Anesthesia Physical  Anesthesia Plan  ASA: III  Anesthesia Plan: MAC   Post-op Pain Management:    Induction: Intravenous  PONV Risk Score and Plan: 1 and Propofol infusion and Treatment may vary due to age or medical condition  Airway Management Planned: Natural Airway and Simple Face Mask  Additional Equipment: None  Intra-op Plan:   Post-operative Plan:   Informed Consent: I have reviewed the patients History and Physical, chart, labs and discussed the procedure including the risks, benefits and alternatives for the proposed anesthesia with the patient or authorized representative who has indicated his/her understanding and acceptance.     Dental advisory given  Plan Discussed with: CRNA  Anesthesia Plan Comments:       Anesthesia Quick Evaluation

## 2018-10-04 NOTE — Consult Note (Signed)
Referring Provider: No ref. provider found Primary Care Physician:  Gildardo Pounds, NP Primary Gastroenterologist:  Dr.  Luiz Iron for Consultation:  Melena  HPI: Lee Greer is a 53 y.o. male with a questionable history of DM II, gastroparesis vs N/V canabanoid hyperemesis who was recently admitted to the hospital 1/21-1/23/2020 with an an acute upper GI bleed etiology unclear, thought to have a Mallory Weis tear. He developed weakness yesterday afternoon while at work. He went home at 2pm and took a shower/soaked in the tub. He thought he was passing only gas while in the tub but passed a clump of dark black gelatinous blood. He got out of the tub and passed dark black watery blood from the rectum, no stool. He stated his current blood from the rectum was dark black but not tarry as previously seen prior to his 09/26/2018  admission. No bright red blood. He denied having any heartburn, upper or lower abdominal pain. No nausea or vomiting. He was eating a soft diet. He was compliant with taking Protonix 40mg  bid, Carafate 1 gm bid and Ferrous Sulfate since his recent hospital discharge. No NSAIDS. No ETOH. He reports losing 10 to 15 lbs since Jan. 2020. Sweats last night. No fever or chills. No family history of gastric or colon cancer. Never had a colonoscopy.  He is hemodynamically stable, in no acute distress.  In review, he was admitted with an acute UGI bleed 09/26/2018 with hematemesis. Hg 7.4. HCT 22.4. He received 2 units of PRBCs and his H/H stabilized. He underwent an EGD which showed grade A esophagitis without evidence of active bleeding. He was discharged home on Protonix 40mg  bid, Carafate 1 gm qid and Ferrous Sulfate 325mg  bid.   ED Course 1/28-1/29/2020: Admission Hg 4.3. HCT 13.8. He received 2 units of PRBc 10/03/2018. Repeat Hg 5.9. HCT 18.7 completed at 2:42am. A 3rd unit of PRBCs was given at 5am this morning. A post transfusion H/H has not yet been done. He remains NPO.  Protonix 8mg /hr infusion. Plan for EGD today.   Past Medical History:  Diagnosis Date  . Acute kidney injury ([place]) 04/13/2017   related to "dehydration" (04/14/2017)  . Diabetes mellitus without complication ([place])   . Esophageal tear   . Gastroparesis     Past Surgical History:  Procedure Laterality Date  . ESOPHAGOGASTRODUODENOSCOPY (EGD) WITH PROPOFOL N/A 09/27/2018   Procedure: ESOPHAGOGASTRODUODENOSCOPY (EGD) WITH PROPOFOL;  Surgeon: Juanita Craver, MD;  Location: [facility] END[facility]COPY;  Service: Endoscopy;  Laterality: N/A;    Prior to Admission medications   Medication Sig Start Date End Date Taking? Authorizing Provider  ferrous sulfate 325 (65 FE) MG tablet Take 1 tablet (325 mg total) by mouth 2 (two) times daily with a meal. 09/28/18  Yes Ghimire, Henreitta Leber, MD  KLOR-CON M10 10 MEQ tablet Take 10 mEq by mouth daily. 09/19/18  Yes [provider]  Multiple Vitamin (MULTIVITAMIN WITH MINERALS) TABS tablet Take 1 tablet by mouth daily.   Yes [provider]  pantoprazole (PROTONIX) 40 MG tablet Take 1 tablet (40 mg total) by mouth 2 (two) times daily. 09/28/18 09/28/19 Yes Ghimire, Henreitta Leber, MD  sucralfate (CARAFATE) 1 GM/10ML suspension Take 10 mLs (1 g total) by mouth 4 (four) times daily -  with meals and at bedtime. 09/28/18  Yes Ghimire, Henreitta Leber, MD    Current Facility-Administered Medications  Medication Dose Route Frequency Provider Last Rate Last Dose  . acetaminophen (TYLENOL) tablet 650 mg  650 mg Oral  Q6H PRN Reubin Milan, MD       Or  . acetaminophen (TYLENOL) suppository 650 mg  650 mg Rectal Q6H PRN Reubin Milan, MD      . ondansetron [facility]) tablet 4 mg  4 mg Oral Q6H PRN Reubin Milan, MD       Or  . ondansetron [facility]) injection 4 mg  4 mg Intravenous Q6H PRN Reubin Milan, MD      . pantoprazole (PROTONIX) 80 mg in sodium chloride 0.9 % 250 mL (0.32 mg/mL) infusion  8 mg/hr Intravenous Continuous Reubin Milan, MD 25  mL/hr at 10/04/18 0759 8 mg/hr at 10/04/18 0759  . [START ON 10/07/2018] pantoprazole (PROTONIX) injection 40 mg  40 mg Intravenous Q12H Reubin Milan, MD       Current Outpatient Medications  Medication Sig Dispense Refill  . ferrous sulfate 325 (65 FE) MG tablet Take 1 tablet (325 mg total) by mouth 2 (two) times daily with a meal. 90 tablet 0  . KLOR-CON M10 10 MEQ tablet Take 10 mEq by mouth daily.    . Multiple Vitamin (MULTIVITAMIN WITH MINERALS) TABS tablet Take 1 tablet by mouth daily.    . pantoprazole (PROTONIX) 40 MG tablet Take 1 tablet (40 mg total) by mouth 2 (two) times daily. 90 tablet 0  . sucralfate (CARAFATE) 1 GM/10ML suspension Take 10 mLs (1 g total) by mouth 4 (four) times daily -  with meals and at bedtime. 420 mL 0    Allergies as of 10/03/2018  . (No Known Allergies)    Family History  Problem Relation Age of Onset  . CAD Mother   . Diabetes Neg Hx   . Stroke Neg Hx     Social History   Socioeconomic History  . Marital status: Single    Spouse name: Not on file  . Number of children: Not on file  . Years of education: Not on file  . Highest education level: Not on file  Occupational History  . Not on file  Social Needs  . Financial resource strain: Not on file  . Food insecurity:    Worry: Not on file    Inability: Not on file  . Transportation needs:    Medical: Not on file    Non-medical: Not on file  Tobacco Use  . Smoking status: Never Smoker  . Smokeless tobacco: Never Used  Substance and Sexual Activity  . 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 Drug use: Not Currently    Types: Marijuana    Comment: last used 1 month ago  . Sexual activity: Yes  Lifestyle  . Physical activity:    Days per week: Not on file    Minutes per session: Not on file  . Stress: Not on file  Relationships  . Social connections:    Talks on phone: Not on file    Gets together: Not on file    Attends religious service: Not on  file    Active member of club or organization: Not on file    Attends meetings of clubs or organizations: Not on file    Relationship status: Not on file  . Intimate partner violence:    Fear of current or ex partner: Not on file    Emotionally abused: Not on file    Physically abused: Not on file    Forced sexual activity: Not on file  Other Topics Concern  .  Not on file  Social History Narrative  . Not on file    Review of Systems: Gen: See HPI CV: Denies chest pain, angina, palpitations, syncope, orthopnea, PND, peripheral edema, and claudication. Resp: Denies dyspnea at rest, dyspnea with exercise, cough, sputum, wheezing, coughing up blood, and pleurisy. GI: See HPI. GU : Denies urinary burning, blood in urine, urinary frequency, urinary hesitancy, nocturnal urination, and urinary incontinence. MS: Denies joint pain, limitation of movement, and swelling, stiffness, low back pain, extremity pain. Denies muscle weakness, cramps, atrophy.  Derm: Denies rash, itching, dry skin, hives, moles, warts, or unhealing ulcers.  Psych: Denies depression, anxiety, memory loss, suicidal ideation, hallucinations, paranoia, and confusion. Heme: Denies bruising, bleeding, and enlarged lymph nodes. Neuro:  Denies any headaches, dizziness, paresthesias. Endo:  Denies any problems with DM, thyroid, adrenal function.  Physical Exam: Vital signs in last 24 hours: Temp:  [98.2 F (36.8 C)-98.9 F (37.2 C)] 98.7 F (37.1 C) (01/29 0603) Pulse Rate:  [69-105] 82 (01/29 0800) Resp:  [8-23] 14 (01/29 0800) BP: (105-127)/(57-83) 113/71 (01/29 0800) SpO2:  [95 %-100 %] 100 % (01/29 0800) Weight:  [54.4 kg] 54.4 kg (01/28 1800)   General:   Alert,  Well-developed, well-nourished, pleasant and cooperative in NAD Head:  Normocephalic and atraumatic. Eyes:  Sclera clear, no icterus.   Conjunctiva pink. Ears:  Normal auditory acuity. Nose:  No deformity, discharge,  or lesions. Mouth:  No deformity or  lesions.   Neck:  Supple; no masses or thyromegaly. Lungs:  Clear throughout to auscultation.   No wheezes, crackles, or rhonchi. Heart:  Regular rate and rhythm; no murmurs, clicks, rubs,  or gallops. Abdomen:  Soft,nontender, BS active,nonpalp mass or hsm.   Rectal:  Deferred  Msk:  Symmetrical without gross deformities. . Pulses:  Normal pulses noted. Extremities:  Without clubbing or edema. Neurologic:  Alert and  oriented x4;  grossly normal neurologically. Skin:  Intact without significant lesions or rashes.. Psych:  Alert and cooperative. Normal mood and affect.  Intake/Output from previous day: 01/28 0701 - 01/29 0700 In: 2907.1 [I.V.:262.1; Blood:1545; IV Piggyback:1100] Out: -  Intake/Output this shift: No intake/output data recorded.  Lab Results: Recent Labs    10/03/18 1831 10/04/18 0242  WBC 14.8* 12.0*  HGB 4.3* 5.9*  HCT 13.8* 18.7*  PLT 219 161   BMET Recent Labs    10/03/18 1831 10/04/18 0242  NA 141 139  K 3.4* 3.8  CL 112* 116*  CO2 20* 21*  GLUCOSE 126* 97  BUN 14 20  CREATININE 0.80 0.68  CALCIUM 8.1* 7.3*   LFT Recent Labs    10/04/18 0242  PROT 4.3*  ALBUMIN 2.5*  AST 13*  ALT 12  ALKPHOS 31*  BILITOT 1.3*     IMPRESSION:  1. Recurrent Upper GI Bleed: etiology unclear  2. Anemia due to acute GI Bleed. Hg 5.9 HCT 18.7 after 2 units of PRBCs, a 3rd unit of PRBCs transfused at 5am, repeat CBC pending. Hg will need to be > 7 for anesthesia for EGD today.   3. Esophagitis   4. Weight loss  PLAN: 1. EGD today, await repeat H/H, most likely will need further PRBC transfusion. 2. NPO. 3.  Continue Protonix IV drip 4. May require colonoscopy, small bowel evaluation if EGD negative for GI bleed.    Patrecia Pour Kennedy-Houpt  10/04/2018, 8:16 AM

## 2018-10-05 ENCOUNTER — Encounter (HOSPITAL_COMMUNITY): Payer: Self-pay | Admitting: Internal Medicine

## 2018-10-05 LAB — BASIC METABOLIC PANEL
Anion gap: 9 (ref 5–15)
BUN: 9 mg/dL (ref 6–20)
CO2: 22 mmol/L (ref 22–32)
Calcium: 7.9 mg/dL — ABNORMAL LOW (ref 8.9–10.3)
Chloride: 105 mmol/L (ref 98–111)
Creatinine, Ser: 0.71 mg/dL (ref 0.61–1.24)
GFR calc Af Amer: >60 mL/min (ref >60)
GFR calc non Af Amer: >60 mL/min (ref >60)
GLUCOSE: 97 mg/dL (ref 70–99)
POTASSIUM: 3.7 mmol/L (ref 3.5–5.1)
Sodium: 136 mmol/L (ref 135–145)

## 2018-10-05 LAB — CBC WITH DIFFERENTIAL/PLATELET
Abs Immature Granulocytes: 0.07 10*3/uL (ref 0.00–0.07)
Basophils Absolute: 0 10*3/uL (ref 0.0–0.1)
Basophils Relative: 0 %
Eosinophils Absolute: 0 10*3/uL (ref 0.0–0.5)
Eosinophils Relative: 0 %
HCT: 26.4 % — ABNORMAL LOW (ref 39.0–52.0)
Hemoglobin: 8.6 g/dL — ABNORMAL LOW (ref 13.0–17.0)
Immature Granulocytes: 1 %
Lymphocytes Relative: 26 %
Lymphs Abs: 3.9 10*3/uL (ref 0.7–4.0)
MCH: 29.5 pg (ref 26.0–34.0)
MCHC: 32.6 g/dL (ref 30.0–36.0)
MCV: 90.4 fL (ref 80.0–100.0)
MONO ABS: 1.8 10*3/uL — AB (ref 0.1–1.0)
MONOS PCT: 12 %
Neutro Abs: 9.3 10*3/uL — ABNORMAL HIGH (ref 1.7–7.7)
Neutrophils Relative %: 61 %
Platelets: 225 10*3/uL (ref 150–400)
RBC: 2.92 MIL/uL — ABNORMAL LOW (ref 4.22–5.81)
RDW: 18.4 % — ABNORMAL HIGH (ref 11.5–15.5)
WBC: 15.1 10*3/uL — ABNORMAL HIGH (ref 4.0–10.5)
nRBC: 0.5 % — ABNORMAL HIGH (ref 0.0–0.2)

## 2018-10-05 LAB — HEMOGLOBIN AND HEMATOCRIT, BLOOD
HCT: 26.5 % — ABNORMAL LOW (ref 39.0–52.0)
Hemoglobin: 8.3 g/dL — ABNORMAL LOW (ref 13.0–17.0)

## 2018-10-05 MED ORDER — SODIUM CHLORIDE 0.9 % IV BOLUS
500.0000 mL | Freq: Once | INTRAVENOUS | Status: AC
  Administered 2018-10-05: 500 mL via INTRAVENOUS

## 2018-10-05 NOTE — Progress Notes (Addendum)
     [facility] Progress Note  CC:  Upper GI Bleed, melena   Subjective: Feels well post EGD which identified a 59mm gastric ulcer. No further melena. No abdominal pain. On clear liquids, he wishes to advance to soft foods.    Objective:  Vital signs in last 24 hours: Temp:  [98 F (36.7 C)-99.1 F (37.3 C)] 98 F (36.7 C) (01/30 0829) Pulse Rate:  [78-88] 80 (01/30 0829) Resp:  [11-20] 17 (01/30 0455) BP: (97-171)/(64-110) 97/66 (01/30 0829) SpO2:  [100 %] 100 % (01/30 0455) Weight:  [56.7 kg] 56.7 kg (01/29 1230)   General:   Alert,  Well-developed,    in NAD Heart:  Regular rate and rhythm; no murmurs Pulm: clear throughout. Abdomen:  Soft, nontender and nondistended. Normal bowel sounds, without guarding, and without rebound.   Extremities:  Without edema. Neurologic:  Alert and  oriented x4;  grossly normal neurologically. Psych:  Alert and cooperative. Normal mood and affect.  Intake/Output from previous day: 01/29 0701 - 01/30 0700 In: 774 [I.V.:774] Out: 1975 [Urine:1975] Intake/Output this shift: Total I/O In: 240 [P.O.:240] Out: 250 [Urine:250]  Lab Results: Recent Labs    10/04/18 0242 10/04/18 0921 10/04/18 1612 10/05/18 0608  WBC 12.0* 12.5*  --  15.1*  HGB 5.9* 7.9* 9.4* 8.6*  HCT 18.7* 24.2* 28.7* 26.4*  PLT 161 183  --  225   BMET Recent Labs    10/03/18 1831 10/04/18 0242 10/05/18 0608  NA 141 139 136  K 3.4* 3.8 3.7  CL 112* 116* 105  CO2 20* 21* 22  GLUCOSE 126* 97 97  BUN 14 20 9   CREATININE 0.80 0.68 0.71  CALCIUM 8.1* 7.3* 7.9*   LFT Recent Labs    10/04/18 0242  PROT 4.3*  ALBUMIN 2.5*  AST 13*  ALT 12  ALKPHOS 31*  BILITOT 1.3*    Assessment / Plan: * Upper GI Bleed: A 57mm cratered gastric ulcer with a visible vessel found at the incisura, the area was injected with epi for hemostasis and coagulation using bipolar cautery was used. He received a total of 3 units of PRBCs. H/H slightly decreased  today. Hg 8.6 ( 9.4). Hct 26.4 ( 28.7). -repeat CBC in am -continue PPI infusion for a total of 72 hours then change to po bid for 8 weeks -will advance to full diet -No Aspirin, Ibuprofen, Naproxen or other NSAIDS for at least 8 weeks. -repeat EGD as outpatient in 12 weeks to check healing -await pathology results, suspect H. Pylori   Leukocytosis: WBC 12 -> 15.1, most likely due to GI bleed, no obvious signs of infection. He is afebrile.  -repeat CBC in am, continue to follow        LOS: 2 days   ARLANDO LEISINGER  10/05/2018, 11:57 AM

## 2018-10-05 NOTE — Progress Notes (Signed)
[facility] PROGRESS NOTE  Lee Greer EPP:[id number] DOB: Jan 08, 1966 DOA: 10/03/2018 PCP: Gildardo Pounds, NP   Narrative: 35 aam  Known Cannabinoid hyperemesis syndrome, prior abn TFT Recent admission ending 1/23 for ABLA from probable Mallory-Weiss tear--EGd that time showed Grade A esophagitis--at that admission as well needed IV as was signi Dehydrated with vol depletion Re-admit with weakness and burgundy brown sool-  Hemoglobin 4 lytes ok GI contacted-rec admission-received 2 U prbc and a third ordered   A & Plan Probable upper GI bleed Multiple episodes of unformed dark stool at home 1/28-GI t evaluated patient and endoscopy performed as below 1/29 Will need further guidance as per GI regarding PPI therapy looks like at least 48 more hours of IV and only clear liquids given ulcer with visible vessel Follow pathology as question has been raised regarding H. pylori Anemia of acute blood loss superimposed on normocytic anemia Received transfusions recheck labs again about 1 PM if still low working another unit of blood-->hemoglobin equivalent to 8.6 Cannabinoid hyperemesis syndrome Has not smoked since he left the hospital Leukocytosis-unclear etiology Monitor and if stabilized would just trend-he has had not had any fever Prior abnormal TFT Interval follow-up required    SCD, inpatient, await gastroenterology input, not ready for discharge at this time-still needs in hospital stay and IV medications   Verlon Au, MD  [facility] [address] [place] [email or web address] 7PM-7AM contact night coverage as above 10/05/2018, 10:56 AM  LOS: 2 days   Consultants:  Gastroenterology  Procedures:  Transfused x3 so far  Antimicrobials:  None  Interval history/Subjective:  Patient awake pleasant alert no distress tolerating diet with liquids had 1 or 2 episodes of wet dark liquid stool No abdominal pain at this time  Objective:  Vitals:  Vitals:   10/05/18  0455 10/05/18 0829  BP: 101/64 97/66  Pulse: 85 80  Resp: 17   Temp: 98.2 F (36.8 C) 98 F (36.7 C)  SpO2: 100%     Exam:  Awake alert pleasant no distress mild pallor No icterus Chest clinically clear S1-S2 no murmur although tachycardic abdomen is soft no rebound no guarding No lower extremity edema   I have personally reviewed the following:  DATA   Labs:  Hemoglobin currently 8.6 up from 4.8 on admission  WBC 12.0-->15.1   EGD Impression:               - Normal esophagus.                           - Gastric ulcer with a visible vessel. Injected.                            Treated with bipolar cautery.                           - Gastritis. Biopsied.                           - Normal examined duodenum. Moderate Sedation:      N/A Recommendation:           - Return patient to hospital ward for ongoing care.                           - Clear liquid diet  today.                           - Continue present medications.                           - Continue PPI infusion x 72 hours, then change in                            PO BID for at least 8 weeks.                           - No aspirin, ibuprofen, naproxen, or other                            non-steroidal anti-inflammatory drugs for at least                            8 weeks.                           - Await pathology results. High suspicion for H.                            Pylori and if positive will need appropriate                            antibiotic therapy.                           - Repeat upper endoscopy in 12 weeks to check                            healing.  Scheduled Meds: . [START ON 10/07/2018] pantoprazole  40 mg Intravenous Q12H   Continuous Infusions: . pantoprozole (PROTONIX) infusion 8 mg/hr (10/04/18 2332)    Principal Problem:   UGI bleed Active Problems:   Malnutrition of moderate degree   Diabetes mellitus without complication (HCC)   Hypokalemia   Upper GI bleed   Acute  GI bleeding   Anemia, posthemorrhagic, acute   Acute gastric ulcer with hemorrhage   Acute gastritis without hemorrhage   LOS: 2 days

## 2018-10-05 NOTE — Anesthesia Postprocedure Evaluation (Signed)
Anesthesia Post Note  Patient: Lee Greer  Procedure(s) Performed: ESOPHAGOGASTRODUODENOSCOPY (EGD) WITH PROPOFOL (N/A ) HEMOSTASIS CONTROL HOT HEMOSTASIS (ARGON PLASMA COAGULATION/BICAP) (N/A ) BIOPSY     Patient location during evaluation: PACU Anesthesia Type: MAC Level of consciousness: awake and alert Pain management: pain level controlled Vital Signs Assessment: post-procedure vital signs reviewed and stable Respiratory status: spontaneous breathing Cardiovascular status: stable Anesthetic complications: no    Last Vitals:  Vitals:   10/05/18 0455 10/05/18 0829  BP: 101/64 97/66  Pulse: 85 80  Resp: 17   Temp: 36.8 C 36.7 C  SpO2: 100%     Last Pain:  Vitals:   10/05/18 1012  TempSrc:   PainSc: 0-No pain                 Nolon Nations

## 2018-10-06 DIAGNOSIS — K250 Acute gastric ulcer with hemorrhage: Secondary | ICD-10-CM

## 2018-10-06 LAB — CBC WITH DIFFERENTIAL/PLATELET
Abs Immature Granulocytes: 0.06 10*3/uL (ref 0.00–0.07)
BASOS ABS: 0 10*3/uL (ref 0.0–0.1)
Basophils Relative: 0 %
Eosinophils Absolute: 0.3 10*3/uL (ref 0.0–0.5)
Eosinophils Relative: 3 %
HCT: 26.6 % — ABNORMAL LOW (ref 39.0–52.0)
Hemoglobin: 8.5 g/dL — ABNORMAL LOW (ref 13.0–17.0)
Immature Granulocytes: 1 %
Lymphocytes Relative: 36 %
Lymphs Abs: 3.9 10*3/uL (ref 0.7–4.0)
MCH: 30.4 pg (ref 26.0–34.0)
MCHC: 32 g/dL (ref 30.0–36.0)
MCV: 95 fL (ref 80.0–100.0)
Monocytes Absolute: 1.2 10*3/uL — ABNORMAL HIGH (ref 0.1–1.0)
Monocytes Relative: 11 %
NEUTROS ABS: 5.3 10*3/uL (ref 1.7–7.7)
Neutrophils Relative %: 49 %
Platelets: 220 10*3/uL (ref 150–400)
RBC: 2.8 MIL/uL — ABNORMAL LOW (ref 4.22–5.81)
RDW: 19.4 % — ABNORMAL HIGH (ref 11.5–15.5)
WBC: 10.9 10*3/uL — ABNORMAL HIGH (ref 4.0–10.5)
nRBC: 0.3 % — ABNORMAL HIGH (ref 0.0–0.2)

## 2018-10-06 MED ORDER — SODIUM CHLORIDE 0.9 % IV SOLN
INTRAVENOUS | Status: DC
  Administered 2018-10-06 – 2018-10-07 (×2): via INTRAVENOUS

## 2018-10-06 NOTE — Progress Notes (Signed)
This shift MD notified of pt's manual BP 90/52. New orders for 500 ml bolus and H/H and to notify if hemoglobin<8. Will continue to monitor

## 2018-10-06 NOTE — Progress Notes (Signed)
[facility] PROGRESS NOTE  Lee Greer:[id number] DOB: [birth year] DOA: 10/03/2018 PCP: Gildardo Pounds, NP   Narrative: 61 aam  Known Cannabinoid hyperemesis syndrome, prior abn TFT Recent admission ending 1/23 for ABLA from probable Mallory-Weiss tear--EGd that time showed Grade A esophagitis--at that admission as well needed IV as was signi Dehydrated with vol depletion Re-admit with weakness and burgundy brown sool-  Hemoglobin 4 lytes ok GI contacted-rec admission-received 2 U prbc and a third ordered   A & Plan Probable upper GI bleed Multiple episodes of unformed dark stool at home 1/28-GI perf endoscopy 1/29 Complete PPI GTT 2/1 then bid dosing Stains/pat neg for h. Pylori-await IgG Anemia of acute blood loss superimposed on normocytic anemia Received transfusions Some constitutional low BP without bleeding and no new stool--bolused 500 cc 1/30 pm Continue IVF 75 cc/h today and reassess trends in am Cannabinoid hyperemesis syndrome Has not smoked since he left the hospital Leukocytosis-unclear etiology Monitor and trending--no fever Prior abnormal TFT Interval follow-up required    SCD, inpatient, await gastroenterology input, not ready for discharge at this time-still needs in hospital stay and IV medications   Monica Codd, MD  [facility] [place] [email or web address] 7PM-7AM contact night coverage as above 10/06/2018, 12:31 PM  LOS: 3 days   Consultants:  Gastroenterology  Procedures:  Transfused x3 so far  Antimicrobials:  None  Interval history/Subjective:  No dark stool Some gas  no fever no chills had a soft diet  no overt bleeding No cp  Objective:  Vitals:  Vitals:   10/06/18 0008 10/06/18 0534  BP: 106/61 103/65  Pulse: 73 91  Resp:  16  Temp:  98.1 F (36.7 C)  SpO2:  100%    Exam:  mild pallor No icterus No added sound to lungs abd soft and epigastrium non tender S1-S2 no murmur  No lower extremity  edema   I have personally reviewed the following:  DATA   Labs:  Hemoglobin currently 8.5 up from 4.8 on admission  WBC 12.0-->15.1-->10.9   EGD Impression:               - Normal esophagus.                           - Gastric ulcer with a visible vessel. Injected.                            Treated with bipolar cautery.                           - Gastritis. Biopsied.                           - Normal examined duodenum. Moderate Sedation:      N/A Recommendation:           - Return patient to hospital ward for ongoing care.                           - Clear liquid diet today.                           - Continue present medications.                           -  Continue PPI infusion x 72 hours, then change in                            PO BID for at least 8 weeks.                           - No aspirin, ibuprofen, naproxen, or other                            non-steroidal anti-inflammatory drugs for at least                            8 weeks.                           - Await pathology results. High suspicion for H.                            Pylori and if positive will need appropriate                            antibiotic therapy.                           - Repeat upper endoscopy in 12 weeks to check                            healing.  Scheduled Meds: . [START ON 10/07/2018] pantoprazole  40 mg Intravenous Q12H   Continuous Infusions: . pantoprozole (PROTONIX) infusion 8 mg/hr (10/06/18 0400)    Principal Problem:   UGI bleed Active Problems:   Malnutrition of moderate degree   Diabetes mellitus without complication (HCC)   Hypokalemia   Upper GI bleed   Acute GI bleeding   Anemia, posthemorrhagic, acute   Acute gastric ulcer with hemorrhage   Acute gastritis without hemorrhage   LOS: 3 days

## 2018-10-06 NOTE — Progress Notes (Signed)
     [facility] Progress Note  CC:  Upper GI Bleed, melena  Subjective: No abdominal pain. Tolerating a soft diet. No BM since his EGD.   Objective:  Vital signs in last 24 hours: Temp:  [98.1 F (36.7 C)-99 F (37.2 C)] 98.1 F (36.7 C) (01/31 0534) Pulse Rate:  [73-91] 91 (01/31 0534) Resp:  [16-19] 16 (01/31 0534) BP: (84-106)/(52-65) 103/65 (01/31 0534) SpO2:  [100 %] 100 % (01/31 0534)   General:   Alert,  Well-developed,    in NAD Heart:  Regular rate and rhythm; no murmurs. Pulm; clear throughout. Abdomen:  Soft, nontender and nondistended. Normal bowel sounds, without guarding, and without rebound.   Extremities:  Without edema. Neurologic:  Alert and  oriented x4;  grossly normal neurologically. Psych:  Alert and cooperative. Normal mood and affect.  Intake/Output from previous day: 01/30 0701 - 01/31 0700 In: 1347.9 [P.O.:590; I.V.:757.9] Out: 250 [Urine:250] Intake/Output this shift: No intake/output data recorded.  Lab Results: Recent Labs    10/04/18 0921  10/05/18 0608 10/05/18 2305 10/06/18 0610  WBC 12.5*  --  15.1*  --  10.9*  HGB 7.9*   < > 8.6* 8.3* 8.5*  HCT 24.2*   < > 26.4* 26.5* 26.6*  PLT 183  --  225  --  220   < > = values in this interval not displayed.   BMET Recent Labs    10/03/18 1831 10/04/18 0242 10/05/18 0608  NA 141 139 136  K 3.4* 3.8 3.7  CL 112* 116* 105  CO2 20* 21* 22  GLUCOSE 126* 97 97  BUN 14 20 9   CREATININE 0.80 0.68 0.71  CALCIUM 8.1* 7.3* 7.9*   LFT Recent Labs    10/04/18 0242  PROT 4.3*  ALBUMIN 2.5*  AST 13*  ALT 12  ALKPHOS 31*  BILITOT 1.3*    Assessment / Plan:  * Recurrent Upper GI Bleed, repeat EGD identified 10/04/2018  A 18mm cratered gastric ulcer with a visible vessel found at the incisura, the area was injected with epi for hemostasis and coagulation using bipolar cautery was used. Pathology was negative for H. Pylori, still concern for possible H. Pylori therefore  H.pylori IgG level ordered.  He received a total of 3 units of PRBCs during this hospital admission. H/H stable. His blood pressure dropped 90/52 early am today, received 500cc NS IV. Repeat BP 103/65. No further melena (no BM since his EGD). Tolerating a soft diet. -Continue Protonix infusion to complete 72 hours then change to twice daily po bid starting 10/07/2018 to continue for 8 weeks. -H. Pylori IgG pending -most likely will be discharged home 10/07/2018 -follow up EGD to assess ulcer healing in 12 weeks -No NSAIDS -monitor BP  Leukocytosis resolving: WBC 10.9     LOS: 3 days   Lee Greer  10/06/2018, 9:36 AM

## 2018-10-07 ENCOUNTER — Encounter: Payer: Self-pay | Admitting: Family Medicine

## 2018-10-07 LAB — CBC WITH DIFFERENTIAL/PLATELET
Abs Immature Granulocytes: 0.05 10*3/uL (ref 0.00–0.07)
BASOS ABS: 0 10*3/uL (ref 0.0–0.1)
Basophils Relative: 0 %
Eosinophils Absolute: 0.2 10*3/uL (ref 0.0–0.5)
Eosinophils Relative: 2 %
HEMATOCRIT: 24.4 % — AB (ref 39.0–52.0)
Hemoglobin: 7.7 g/dL — ABNORMAL LOW (ref 13.0–17.0)
Immature Granulocytes: 1 %
Lymphocytes Relative: 38 %
Lymphs Abs: 4.1 10*3/uL — ABNORMAL HIGH (ref 0.7–4.0)
MCH: 30.4 pg (ref 26.0–34.0)
MCHC: 31.6 g/dL (ref 30.0–36.0)
MCV: 96.4 fL (ref 80.0–100.0)
Monocytes Absolute: 1 10*3/uL (ref 0.1–1.0)
Monocytes Relative: 9 %
Neutro Abs: 5.3 10*3/uL (ref 1.7–7.7)
Neutrophils Relative %: 50 %
Platelets: 222 10*3/uL (ref 150–400)
RBC: 2.53 MIL/uL — ABNORMAL LOW (ref 4.22–5.81)
RDW: 19.4 % — ABNORMAL HIGH (ref 11.5–15.5)
WBC: 10.8 10*3/uL — ABNORMAL HIGH (ref 4.0–10.5)
nRBC: 0 % (ref 0.0–0.2)

## 2018-10-07 LAB — TYPE AND SCREEN
ABO/RH(D): A NEG
Antibody Screen: NEGATIVE
UNIT DIVISION: 0
Unit division: 0
Unit division: 0
Unit division: 0
Unit division: 0

## 2018-10-07 LAB — BPAM RBC
BLOOD PRODUCT EXPIRATION DATE: 202002292359
Blood Product Expiration Date: 202002192359
Blood Product Expiration Date: 202002192359
Blood Product Expiration Date: 202002282359
Blood Product Expiration Date: 202002292359
ISSUE DATE / TIME: 202001282013
ISSUE DATE / TIME: 202001282247
ISSUE DATE / TIME: 202001290248
Unit Type and Rh: 600
Unit Type and Rh: 600
Unit Type and Rh: 600
Unit Type and Rh: 600
Unit Type and Rh: 600

## 2018-10-07 MED ORDER — ACETAMINOPHEN 325 MG PO TABS: 650.0000 mg | ORAL_TABLET | Freq: Four times a day (QID) | ORAL | Status: DC | PRN

## 2018-10-07 MED ORDER — PANTOPRAZOLE SODIUM 40 MG PO TBEC: 40.0000 mg | DELAYED_RELEASE_TABLET | Freq: Two times a day (BID) | ORAL | 11 refills | Status: DC

## 2018-10-07 MED ORDER — TRAMADOL HCL 50 MG PO TABS: 50.0000 mg | ORAL_TABLET | Freq: Three times a day (TID) | ORAL | 0 refills | Status: DC | PRN

## 2018-10-07 NOTE — Progress Notes (Signed)
note

## 2018-10-07 NOTE — Progress Notes (Signed)
Lee Greer to be D/C'd Home per MD order.  Discussed prescriptions and follow up appointments with the patient. Prescriptions given to patient, medication list explained in detail. Pt verbalized understanding.  Allergies as of 10/07/2018   No Known Allergies     Medication List    STOP taking these medications   sucralfate 1 GM/10ML suspension Commonly known as:  CARAFATE     TAKE these medications   acetaminophen 325 MG tablet Commonly known as:  TYLENOL Take 2 tablets (650 mg total) by mouth every 6 (six) hours as needed for mild pain (or Fever >/= 101).   ferrous sulfate 325 (65 FE) MG tablet Take 1 tablet (325 mg total) by mouth 2 (two) times daily with a meal.   KLOR-CON M10 10 MEQ tablet Generic drug:  potassium chloride Take 10 mEq by mouth daily.   multivitamin with minerals Tabs tablet Take 1 tablet by mouth daily.   pantoprazole 40 MG tablet Commonly known as:  PROTONIX Take 1 tablet (40 mg total) by mouth 2 (two) times daily.   traMADol 50 MG tablet Commonly known as:  ULTRAM Take 1 tablet (50 mg total) by mouth every 8 (eight) hours as needed for moderate pain.       Vitals:   10/06/18 2059 10/07/18 0443  BP: (!) 105/48 101/63  Pulse: 78 75  Resp: 20 20  Temp: 99.3 F (37.4 C) 98.4 F (36.9 C)  SpO2: 100% 100%    Skin clean, dry and intact without evidence of skin break down, no evidence of skin tears noted. IV catheter discontinued intact. Site without signs and symptoms of complications. Dressing and pressure applied. Pt denies pain at this time. No complaints noted.  An After Visit Summary was printed and given to the patient. Patient escorted via [place], and D/C home via private auto.  Lee Greer 10/07/2018 12:46 PM

## 2018-10-07 NOTE — Care Management Note (Signed)
Case Management Note  Patient Details  Name: Lee Greer MRN: [id number] Date of Birth: Jan 30, 1966  Subjective/Objective:   UGI bleed                 Action/Plan: NCM spoke to pt via phone. States he has contact number for [facility] and plans to call and schedule f/u appt. He can afford Protonix. Provided pt with contact info for [employer]. States he has concerns about paying hospital bills.   Expected Discharge Date:  10/07/18               Expected Discharge Plan:  Home/Self Care  In-House Referral:  NA  Discharge planning Services  CM Consult, [facility]  Post Acute Care Choice:  NA Choice offered to:  NA  DME Arranged:  N/A DME Agency:  NA  HH Arranged:  NA HH Agency:  NA  Status of Service:  Completed, signed off  If discussed at [place] of Stay Meetings, dates discussed:    Additional Comments:  Erenest Rasher, RN 10/07/2018, 1:11 PM

## 2018-10-07 NOTE — Discharge Summary (Addendum)
Physician Discharge Summary  Lee Greer BHA:[id number] DOB: 1965-12-21 DOA: 10/03/2018  PCP: Gildardo Pounds, NP  Admit date: 10/03/2018 Discharge date: 10/07/2018  Time spent: 35 minutes  Recommendations for Outpatient Follow-up:  1. Needs CBC in 1 week 2. New medications are Protonix 40 twice daily this admission to continue for at least 8 weeks will need further scop 3.  needs consideration for H. pylori eradication although biopsy was negative-still pending are the H. pylori in the IgG levels which may determine this and Dr. Elmo Putt will be CCed on this note so that he can follow the same 4. Will need thyroid function testing in about 4 to 6 weeks as able  Discharge Diagnoses:  Principal Problem:   UGI bleed Active Problems:   Malnutrition of moderate degree   Diabetes mellitus without complication (HCC)   Hypokalemia   Upper GI bleed   Acute GI bleeding   Anemia, posthemorrhagic, acute   Acute gastric ulcer with hemorrhage   Acute gastritis without hemorrhage   Discharge Condition: Improved  Diet recommendation: Heart healthy low-salt  Filed Weights   10/03/18 1800 10/04/18 1230  Weight: 54.4 kg 56.7 kg    History of present illness:  36 aam  Known Cannabinoid hyperemesis syndrome, prior abn TFT Recent admission ending 1/23 for ABLA from probable Mallory-Weiss tear--EGd that time showed Grade A esophagitis--at that admission as well needed IV as was signi Dehydrated with vol depletion Re-admit with weakness and burgundy brown sool-  Hemoglobin 4 lytes ok GI contacted-rec admission-received 2 U prbc and a third ordered  Hospital Course:  Probable upper GI bleed Multiple episodes of unformed dark stool at home 1/28-GI perf endoscopy 1/29 as per below Complete PPI GTT 2/1 then bid dosing-prescription given on discharge to continue the same and will need eventual outpatient endoscopy in about 6 to 8 weeks to denote healing Stains/pat neg for h. Pylori-await  IgG Anemia of acute blood loss superimposed on normocytic anemia Received transfusions this admission X 4 units Some constitutional low BP this admission however this resolved And IV fluid was discontinued Hemoglobin was in the 7 range patient was feeling fair able to do some activity but felt a little winded hence was given a work note-he deferred getting another unit of blood on 2/1 want to go home I cautioned him to continue this p.o. iron twice daily Cannabinoid hyperemesis syndrome Has not smoked since he left the hospital Leukocytosis-unclear etiology Monitor and trending--no fever Prior abnormal TFT Interval follow-up required   Procedures: Endoscopy Impression:               - Normal esophagus.                           - Gastric ulcer with a visible vessel. Injected.                            Treated with bipolar cautery.                           - Gastritis. Biopsied.                           - Normal examined duodenum. Moderate Sedation:      N/A Recommendation:           - Return patient to hospital ward  for ongoing care.                           - Clear liquid diet today.                           - Continue present medications.                           - Continue PPI infusion x 72 hours, then change in                            PO BID for at least 8 weeks.                           - No aspirin, ibuprofen, naproxen, or other                            non-steroidal anti-inflammatory drugs for at least                            8 weeks.                           - Await pathology results. High suspicion for H.                            Pylori and if positive will need appropriate                            antibiotic therapy.                           - Repeat upper endoscopy in 12 weeks to check                            healing.  Consultations:   gastroenterology  Discharge Exam: Vitals:   10/06/18 2059 10/07/18 0443  BP: (!) 105/48 101/63  Pulse:  78 75  Resp: 20 20  Temp: 99.3 F (37.4 C) 98.4 F (36.9 C)  SpO2: 100% 100%    Awake pleasant alert no distress EOMI NCAT mild pallor no icterus Abdomen soft no epigastric tenderness rebound or guarding No lower extremity edema Rash absent neurologically intact  Discharge Instructions   Discharge Instructions    Diet - low sodium heart healthy   Complete by:  As directed    Discharge instructions   Complete by:  As directed    Follow-up with your gastroenterologist in about 1 week-I will leave the number on the chart so that you can call him and ensure that if there is a need to have treatment of H. pylori and this is done and he can give you some samples for treatment You will need to continue your iron your acid reducer and your other medications as an outpatient Please note that you will need eventual labs in the outpatient setting to determine what your blood numbers look like Please do not forget to take your stomach medications without fail twice a day (Protonix)  as this will help you heal from the bleeding No further nonsteroidal medications if you have taken any no over-the-counter pain meds everything Tylenol I will prescribe for you limited amount of tramadol for severe pain if you have We will write you a letter out for 7 days from your day of discharge to ensure that you have enough time to recuperate   Increase activity slowly   Complete by:  As directed      Allergies as of 10/07/2018   No Known Allergies     Medication List    STOP taking these medications   sucralfate 1 GM/10ML suspension Commonly known as:  CARAFATE     TAKE these medications   acetaminophen 325 MG tablet Commonly known as:  TYLENOL Take 2 tablets (650 mg total) by mouth every 6 (six) hours as needed for mild pain (or Fever >/= 101).   ferrous sulfate 325 (65 FE) MG tablet Take 1 tablet (325 mg total) by mouth 2 (two) times daily with a meal.   KLOR-CON M10 10 MEQ tablet Generic  drug:  potassium chloride Take 10 mEq by mouth daily.   multivitamin with minerals Tabs tablet Take 1 tablet by mouth daily.   pantoprazole 40 MG tablet Commonly known as:  PROTONIX Take 1 tablet (40 mg total) by mouth 2 (two) times daily.   traMADol 50 MG tablet Commonly known as:  ULTRAM Take 1 tablet (50 mg total) by mouth every 8 (eight) hours as needed for moderate pain.      No Known Allergies    The results of significant diagnostics from this hospitalization (including imaging, microbiology, ancillary and laboratory) are listed below for reference.    Significant Diagnostic Studies: Dg Chest Port 1 View  Result Date: 09/26/2018 CLINICAL DATA:  Weakness EXAM: PORTABLE CHEST 1 VIEW COMPARISON:  09/16/2017 FINDINGS: Hyperinflated lungs. No focal airspace disease or effusion. Normal heart size. No pneumothorax. IMPRESSION: Hyperinflation without acute airspace disease Electronically Signed   By: Donavan Foil M.D.   On: 09/26/2018 01:18    Microbiology: No results found for this or any previous visit (from the past 240 hour(s)).   Labs: Basic Metabolic Panel: Recent Labs  Lab 10/03/18 1831 10/04/18 0242 10/05/18 0608  NA 141 139 136  K 3.4* 3.8 3.7  CL 112* 116* 105  CO2 20* 21* 22  GLUCOSE 126* 97 97  BUN 14 20 9   CREATININE 0.80 0.68 0.71  CALCIUM 8.1* 7.3* 7.9*   Liver Function Tests: Recent Labs  Lab 10/03/18 1831 10/04/18 0242  AST 18 13*  ALT 13 12  ALKPHOS 32* 31*  BILITOT 0.4 1.3*  PROT 4.8* 4.3*  ALBUMIN 2.9* 2.5*   No results for input(s): LIPASE, AMYLASE in the last 168 hours. No results for input(s): AMMONIA in the last 168 hours. CBC: Recent Labs  Lab 10/04/18 0242 10/04/18 0921 10/04/18 1612 10/05/18 0608 10/05/18 2305 10/06/18 0610 10/07/18 0534  WBC 12.0* 12.5*  --  15.1*  --  10.9* 10.8*  NEUTROABS 5.7  --   --  9.3*  --  5.3 5.3  HGB 5.9* 7.9* 9.4* 8.6* 8.3* 8.5* 7.7*  HCT 18.7* 24.2* 28.7* 26.4* 26.5* 26.6* 24.4*  MCV  94.4 90.3  --  90.4  --  95.0 96.4  PLT 161 183  --  225  --  220 222   Cardiac Enzymes: No results for input(s): CKTOTAL, CKMB, CKMBINDEX, TROPONINI in the last 168 hours. BNP: BNP (last 3 results) No results  for input(s): BNP in the last 8760 hours.  ProBNP (last 3 results) No results for input(s): PROBNP in the last 8760 hours.  CBG: Recent Labs  Lab 10/03/18 2200  GLUCAP 114*       Signed:  Nita Sells MD   [facility] 10/07/2018, 10:00 AM

## 2018-10-09 ENCOUNTER — Telehealth: Payer: Self-pay | Admitting: Internal Medicine

## 2018-10-09 LAB — H. PYLORI ANTIBODY, IGG: H Pylori IgG: 6.49 Index Value — ABNORMAL HIGH (ref 0.00–0.79)

## 2018-10-09 NOTE — Telephone Encounter (Signed)
See results please advise h pylori tx

## 2018-10-09 NOTE — Telephone Encounter (Signed)
Pt called to enquire about his pathology results and stated [name] wanted to start him on antibiotics if positive for H. pylori.

## 2018-10-10 ENCOUNTER — Other Ambulatory Visit: Payer: Self-pay

## 2018-10-10 MED ORDER — CLARITHROMYCIN 500 MG PO TABS: 500.0000 mg | ORAL_TABLET | Freq: Two times a day (BID) | ORAL | 0 refills | Status: DC

## 2018-10-10 MED ORDER — AMOXICILLIN 500 MG PO CAPS: 500.0000 mg | ORAL_CAPSULE | Freq: Four times a day (QID) | ORAL | 0 refills | Status: DC

## 2018-10-10 NOTE — Telephone Encounter (Signed)
See result note from H Pylori IgG I sent to [facility]

## 2018-10-23 ENCOUNTER — Encounter: Payer: Self-pay | Admitting: *Deleted

## 2018-11-06 ENCOUNTER — Encounter: Payer: Self-pay | Admitting: Internal Medicine

## 2018-11-06 ENCOUNTER — Other Ambulatory Visit (INDEPENDENT_AMBULATORY_CARE_PROVIDER_SITE_OTHER): Payer: Self-pay

## 2018-11-06 ENCOUNTER — Ambulatory Visit (INDEPENDENT_AMBULATORY_CARE_PROVIDER_SITE_OTHER): Payer: Self-pay | Admitting: Internal Medicine

## 2018-11-06 ENCOUNTER — Inpatient Hospital Stay: Payer: Self-pay | Admitting: Nurse Practitioner

## 2018-11-06 ENCOUNTER — Telehealth: Payer: Self-pay | Admitting: Nurse Practitioner

## 2018-11-06 VITALS — BP 100/66 | HR 78 | Ht 73.0 in | Wt 153.0 lb

## 2018-11-06 DIAGNOSIS — B9681 Helicobacter pylori [H. pylori] as the cause of diseases classified elsewhere: Secondary | ICD-10-CM

## 2018-11-06 DIAGNOSIS — Z1211 Encounter for screening for malignant neoplasm of colon: Secondary | ICD-10-CM

## 2018-11-06 DIAGNOSIS — K259 Gastric ulcer, unspecified as acute or chronic, without hemorrhage or perforation: Secondary | ICD-10-CM

## 2018-11-06 LAB — CBC WITH DIFFERENTIAL/PLATELET
Basophils Absolute: 0 10*3/uL (ref 0.0–0.1)
Basophils Relative: 0.7 % (ref 0.0–3.0)
Eosinophils Absolute: 0.2 10*3/uL (ref 0.0–0.7)
Eosinophils Relative: 2.7 % (ref 0.0–5.0)
HCT: 37.2 % — ABNORMAL LOW (ref 39.0–52.0)
HEMOGLOBIN: 12.5 g/dL — AB (ref 13.0–17.0)
Lymphocytes Relative: 47.3 % — ABNORMAL HIGH (ref 12.0–46.0)
Lymphs Abs: 3.3 10*3/uL (ref 0.7–4.0)
MCHC: 33.5 g/dL (ref 30.0–36.0)
MCV: 93.1 fl (ref 78.0–100.0)
Monocytes Absolute: 0.9 10*3/uL (ref 0.1–1.0)
Monocytes Relative: 13.1 % — ABNORMAL HIGH (ref 3.0–12.0)
NEUTROS ABS: 2.5 10*3/uL (ref 1.4–7.7)
Neutrophils Relative %: 36.2 % — ABNORMAL LOW (ref 43.0–77.0)
Platelets: 286 10*3/uL (ref 150.0–400.0)
RBC: 4 Mil/uL — ABNORMAL LOW (ref 4.22–5.81)
RDW: 16.2 % — ABNORMAL HIGH (ref 11.5–15.5)
WBC: 6.9 10*3/uL (ref 4.0–10.5)

## 2018-11-06 LAB — IBC + FERRITIN
Ferritin: 27.9 ng/mL (ref 22.0–322.0)
IRON: 87 ug/dL (ref 42–165)
Saturation Ratios: 21.7 % (ref 20.0–50.0)
Transferrin: 287 mg/dL (ref 212.0–360.0)

## 2018-11-06 MED ORDER — OMEPRAZOLE 40 MG PO CPDR: 40.0000 mg | DELAYED_RELEASE_CAPSULE | Freq: Every day | ORAL | 1 refills | Status: DC

## 2018-11-06 MED ORDER — SUPREP BOWEL PREP KIT 17.5-3.13-1.6 GM/177ML PO SOLN: 1.0000 | ORAL | 0 refills | Status: DC

## 2018-11-06 NOTE — Patient Instructions (Addendum)
You have been scheduled for an endoscopy and colonoscopy. Please follow the written instructions given to you at your visit today. Please pick up your prep supplies at the pharmacy within the next 1-3 days. If you use inhalers (even only as needed), please bring them with you on the day of your procedure. Your physician has requested that you go to www.startemmi.com and enter the access code given to you at your visit today. This web site gives a general overview about your procedure. However, you should still follow specific instructions given to you by our office regarding your preparation for the procedure.  We have sent the following medications to your pharmacy for you to pick up at your convenience: Omeprazole 40 mg daily   Your provider has requested that you go to the basement level for lab work before leaving today. Press "B" on the elevator. The lab is located at the first door on the left as you exit the elevator.  If you are age 41 or older, your body mass index should be between 23-30. Your Body mass index is 20.19 kg/m. If this is out of the aforementioned range listed, please consider follow up with your Primary Care Provider.  If you are age 42 or younger, your body mass index should be between 19-25. Your Body mass index is 20.19 kg/m. If this is out of the aformentioned range listed, please consider follow up with your Primary Care Provider.

## 2018-11-06 NOTE — Progress Notes (Signed)
Patient ID: Lee Greer, male   DOB: [birth year], 53 y.o.   MRN: [id number] HPI: Lee Greer is a 53 yo male with PMH of acute, bleeding gastric ulcer, H pylori, who is seen in hospital follow-up.  I saw him in hospital consultation in January 2020.  He initially had an upper endoscopy performed by Dr. Collene Mares on 09/27/2018 hematemesis, nausea vomiting and acute anemia.  This showed patchy gastritis and reflux esophagitis.  He then returned to the hospital with further decline in anemia and recurrent hematemesis.  I performed another upper endoscopy on 10/04/2018 which revealed a gastric ulcer on the incisura with a visible vessel treated with epinephrine injection and bipolar cautery.  Previous biopsies were positive for metaplasia but negative for H. pylori.  His H pylori serum antibody was positive and he was treated with triple therapy.  He reports he is feeling much better.  Energy levels have returned to normal.  He had 3 episodes of abdominal pain since discharge from the hospital and this was related to eating pizza and drinking a carbonated beverage.  He has avoided heavy foods and sodas.  He ran out of pantoprazole about 10 days ago.  He has had no further melena.  Good appetite.  Energy levels have returned to normal.  No diarrhea or constipation.  He completed his H. pylori antibiotics.  He is back to work at [employer].  Past Medical History:  Diagnosis Date  . Acute kidney injury ([place]) 04/13/2017   related to "dehydration" (04/14/2017)  . Cannabinoid hyperemesis syndrome ([place])   . Diabetes mellitus without complication ([place])   . Esophageal tear   . Gastric ulcer   . Gastritis with intestinal metaplasia of stomach   . Gastroparesis   . H. pylori infection   . Hypokalemia   . Malnutrition of moderate degree ([place]: 60% to less than 75% of standard weight) ([place])   . Posthemorrhagic anemia   . Upper GI bleed     Past Surgical History:  Procedure Laterality Date  . BIOPSY  10/04/2018   Procedure: BIOPSY;  Surgeon: Jerene Bears, MD;  Location: [facility];  Service: Endoscopy;;  . ESOPHAGOGASTRODUODENOSCOPY (EGD) WITH PROPOFOL N/A 09/27/2018   Procedure: ESOPHAGOGASTRODUODENOSCOPY (EGD) WITH PROPOFOL;  Surgeon: Juanita Craver, MD;  Location: [facility] ENDOSCOPY;  Service: Endoscopy;  Laterality: N/A;  . ESOPHAGOGASTRODUODENOSCOPY (EGD) WITH PROPOFOL N/A 10/04/2018   Procedure: ESOPHAGOGASTRODUODENOSCOPY (EGD) WITH PROPOFOL;  Surgeon: Jerene Bears, MD;  Location: [facility];  Service: Endoscopy;  Laterality: N/A;  . HOT HEMOSTASIS N/A 10/04/2018   Procedure: HOT HEMOSTASIS (ARGON PLASMA COAGULATION/BICAP);  Surgeon: Jerene Bears, MD;  Location: [facility];  Service: Endoscopy;  Laterality: N/A;    Outpatient Medications Prior to Visit  Medication Sig Dispense Refill  . acetaminophen (TYLENOL) 325 MG tablet Take 2 tablets (650 mg total) by mouth every 6 (six) hours as needed for mild pain (or Fever >/= 101). (Patient not taking: Reported on 11/06/2018)    . Multiple Vitamin (MULTIVITAMIN WITH MINERALS) TABS tablet Take 1 tablet by mouth daily.    Marland Kitchen amoxicillin (AMOXIL) 500 MG capsule Take 1 capsule (500 mg total) by mouth 4 (four) times daily. 56 capsule 0  . clarithromycin (BIAXIN) 500 MG tablet Take 1 tablet (500 mg total) by mouth 2 (two) times daily. 28 tablet 0  . ferrous sulfate 325 (65 FE) MG tablet Take 1 tablet (325 mg total) by mouth 2 (two) times daily with a meal. 90 tablet 0  . KLOR-CON M10  10 MEQ tablet Take 10 mEq by mouth daily.    . pantoprazole (PROTONIX) 40 MG tablet Take 1 tablet (40 mg total) by mouth 2 (two) times daily. (Patient not taking: Reported on 11/06/2018) 60 tablet 11  . traMADol (ULTRAM) 50 MG tablet Take 1 tablet (50 mg total) by mouth every 8 (eight) hours as needed for moderate pain. 15 tablet 0   No facility-administered medications prior to visit.     No Known Allergies  Family History  Problem Relation Age of Onset  . CAD Mother   . Diabetes  Neg Hx   . Stroke Neg Hx   . Colon cancer Neg Hx   . Esophageal cancer Neg Hx     Social History   Tobacco Use  . Smoking status: Never Smoker  . Smokeless tobacco: Never Used  Substance Use Topics  . Alcohol use: Not Currently    Comment: 04/14/2017 "sober since 03/2008"  . Drug use: Not Currently    Types: Marijuana    Comment: last used 1 month ago    ROS: As per history of present illness, otherwise negative  BP 100/66   Pulse 78   Ht 6\' 1"  (1.854 m)   Wt 153 lb (69.4 kg)   BMI 20.19 kg/m  Gen: awake, alert, NAD HEENT: anicteric, op clear CV: RRR, no mrg Pulm: CTA b/l Abd: soft, NT/ND, +BS throughout Ext: no c/c/e Neuro: nonfocal   RELEVANT LABS AND IMAGING: CBC    Component Value Date/Time   WBC 6.9 11/06/2018 1622   RBC 4.00 (L) 11/06/2018 1622   HGB 12.5 (L) 11/06/2018 1622   HCT 37.2 (L) 11/06/2018 1622   PLT 286.0 11/06/2018 1622   MCV 93.1 11/06/2018 1622   MCH 30.4 10/07/2018 0534   MCHC 33.5 11/06/2018 1622   RDW 16.2 (H) 11/06/2018 1622   LYMPHSABS 3.3 11/06/2018 1622   MONOABS 0.9 11/06/2018 1622   EOSABS 0.2 11/06/2018 1622   BASOSABS 0.0 11/06/2018 1622    CMP     Component Value Date/Time   NA 136 10/05/2018 0608   K 3.7 10/05/2018 0608   CL 105 10/05/2018 0608   CO2 22 10/05/2018 0608   GLUCOSE 97 10/05/2018 0608   BUN 9 10/05/2018 0608   CREATININE 0.71 10/05/2018 0608   CALCIUM 7.9 (L) 10/05/2018 0608   PROT 4.3 (L) 10/04/2018 0242   ALBUMIN 2.5 (L) 10/04/2018 0242   AST 13 (L) 10/04/2018 0242   ALT 12 10/04/2018 0242   ALKPHOS 31 (L) 10/04/2018 0242   BILITOT 1.3 (H) 10/04/2018 0242   GFRNONAA >60 10/05/2018 0608   GFRAA >60 10/05/2018 0960    ASSESSMENT/PLAN: 53 yo male with PMH of acute, bleeding gastric ulcer, H pylori, who is seen in hospital follow-up  1.  Acute gastric ulcer with hemorrhage/H. Pylori --he has been treated for H. pylori.  Repeat blood counts and iron studies today.  He has stopped PPI but we need  to lengthen therapy.  Without insurance omeprazole will be more affordable for him at [facility].  Will begin omeprazole 40 mg once daily for now --Repeat upper endoscopy is recommended.  We discussed the risk, benefits and alternatives and he is agreeable and wishes to proceed.  Document ulcer healing and H. pylori eradication --Continue PPI until upper endoscopy and NSAID avoidance  2.  CRC screening --screening colonoscopy recommended.  We discussed the risk, benefits and alternatives he is agreeable to proceed.  Average risk.  No prior colonoscopy  3.  Lack of medical insurance --[place] assistance paperwork provided.  My expectation is that he would qualify this would help him with both hospital and [facility] affiliated medical bills    IW:[id number], Vernia Buff, [place], [place] [id number]

## 2018-11-06 NOTE — Telephone Encounter (Signed)
Called the patient per provider request and told him to call back to be reschedule due to the pcp wanting him to see the GI doctor.

## 2018-11-08 ENCOUNTER — Telehealth: Payer: Self-pay | Admitting: Internal Medicine

## 2018-11-08 NOTE — Telephone Encounter (Signed)
PT is returning your call about  Lab results.

## 2018-11-08 NOTE — Telephone Encounter (Signed)
See result note.  

## 2019-01-04 ENCOUNTER — Encounter: Payer: Self-pay | Admitting: Internal Medicine

## 2019-01-18 ENCOUNTER — Telehealth: Payer: Self-pay | Admitting: *Deleted

## 2019-01-18 NOTE — Telephone Encounter (Signed)
Patient called back said that he still has the instructions and suprep kit.

## 2019-01-18 NOTE — Telephone Encounter (Signed)
Pt is scheduled for a ECL 5-29 Friday at 11 am with Dr Hilarie Fredrickson-  I mailed new instructions- I LM to see if pt has a suprep kit at home already from [place] with Dr Hilarie Fredrickson or if he needs me to send one to his pharmacy -

## 2019-01-18 NOTE — Telephone Encounter (Signed)
Perfect - will mail new instructions

## 2019-02-01 ENCOUNTER — Telehealth: Payer: Self-pay | Admitting: *Deleted

## 2019-02-01 NOTE — Telephone Encounter (Signed)
Covid-19 travel screening questions  Have you traveled in the last 14 days? No  If yes where?  Do you now or have you had a fever in the last 14 days? No   Do you have any respiratory symptoms of shortness of breath or cough now or in the last 14 days? No   Do you have a medical history of Congestive Heart Failure? No   Do you have a medical history of lung disease? No   Do you have any family members or close contacts with diagnosed or suspected Covid-19? No   Patient is aware of our care-partner policy, and he will wear a  Mask in. No questions from the patient at this time.

## 2019-02-02 ENCOUNTER — Encounter: Payer: Self-pay | Admitting: Internal Medicine

## 2019-02-02 ENCOUNTER — Other Ambulatory Visit: Payer: Self-pay

## 2019-02-02 ENCOUNTER — Ambulatory Visit (AMBULATORY_SURGERY_CENTER): Payer: Self-pay | Admitting: Internal Medicine

## 2019-02-02 VITALS — BP 121/91 | HR 71 | Temp 98.5°F | Resp 11 | Ht 73.0 in | Wt 153.0 lb

## 2019-02-02 DIAGNOSIS — D123 Benign neoplasm of transverse colon: Secondary | ICD-10-CM

## 2019-02-02 DIAGNOSIS — K254 Chronic or unspecified gastric ulcer with hemorrhage: Secondary | ICD-10-CM

## 2019-02-02 DIAGNOSIS — Z1211 Encounter for screening for malignant neoplasm of colon: Secondary | ICD-10-CM

## 2019-02-02 DIAGNOSIS — D120 Benign neoplasm of cecum: Secondary | ICD-10-CM

## 2019-02-02 DIAGNOSIS — D124 Benign neoplasm of descending colon: Secondary | ICD-10-CM

## 2019-02-02 DIAGNOSIS — K2951 Unspecified chronic gastritis with bleeding: Secondary | ICD-10-CM

## 2019-02-02 DIAGNOSIS — K2970 Gastritis, unspecified, without bleeding: Secondary | ICD-10-CM

## 2019-02-02 DIAGNOSIS — K3189 Other diseases of stomach and duodenum: Secondary | ICD-10-CM

## 2019-02-02 DIAGNOSIS — D125 Benign neoplasm of sigmoid colon: Secondary | ICD-10-CM

## 2019-02-02 DIAGNOSIS — K635 Polyp of colon: Secondary | ICD-10-CM

## 2019-02-02 HISTORY — PX: COLONOSCOPY: SHX174

## 2019-02-02 HISTORY — PX: UPPER GASTROINTESTINAL ENDOSCOPY: SHX188

## 2019-02-02 MED ORDER — SODIUM CHLORIDE 0.9 % IV SOLN: 500.0000 mL | Freq: Once | INTRAVENOUS | Status: DC

## 2019-02-02 MED ORDER — OMEPRAZOLE 40 MG PO CPDR: 40.0000 mg | DELAYED_RELEASE_CAPSULE | Freq: Every day | ORAL | 4 refills | Status: DC

## 2019-02-02 NOTE — Progress Notes (Signed)
Per [name]- Omeprazole 40 mg po daily, #90, year refills

## 2019-02-02 NOTE — Progress Notes (Signed)
PT taken to PACU. Monitors in place. VSS. Report given to RN. 

## 2019-02-02 NOTE — Op Note (Signed)
[place] Patient Name: Lee Greer Procedure Date: 02/02/2019 10:55 AM MRN: [id number] Endoscopist: Jerene Bears , MD Age: 53 Referring MD:  Date of Birth: Apr 05, 1966 Gender: Male Account #: [id number] Procedure:                Colonoscopy Indications:              Screening for colorectal malignant neoplasm, This                            is the patient's first colonoscopy Medicines:                Monitored Anesthesia Care Procedure:                Pre-Anesthesia Assessment:                           - Prior to the procedure, a History and Physical                            was performed, and patient medications and                            allergies were reviewed. The patient's tolerance of                            previous anesthesia was also reviewed. The risks                            and benefits of the procedure and the sedation                            options and risks were discussed with the patient.                            All questions were answered, and informed consent                            was obtained. Prior Anticoagulants: The patient has                            taken no previous anticoagulant or antiplatelet                            agents. ASA Grade Assessment: II - A patient with                            mild systemic disease. After reviewing the risks                            and benefits, the patient was deemed in                            satisfactory condition to undergo the procedure.  After obtaining informed consent, the colonoscope                            was passed under direct vision. Throughout the                            procedure, the patient's blood pressure, pulse, and                            oxygen saturations were monitored continuously. The                            Colonoscope was introduced through the anus and                            advanced to the cecum,  identified by appendiceal                            orifice and ileocecal valve. The colonoscopy was                            performed without difficulty. The patient tolerated                            the procedure well. The quality of the bowel                            preparation was good. The ileocecal valve,                            appendiceal orifice, and rectum were photographed. Scope In: 11:19:16 AM Scope Out: 11:33:32 AM Scope Withdrawal Time: 0 hours 11 minutes 17 seconds  Total Procedure Duration: 0 hours 14 minutes 16 seconds  Findings:                 Hemorrhoids were found on perianal exam.                           Two sessile polyps were found in the cecum. The                            polyps were 1 to 2 mm in size. These polyps were                            removed with a cold biopsy forceps. Resection and                            retrieval were complete.                           A 6 mm polyp was found in the hepatic flexure. The                            polyp was sessile.  The polyp was removed with a                            cold snare. Resection and retrieval were complete.                           A 6 mm polyp was found in the descending colon. The                            polyp was sessile. The polyp was removed with a                            cold snare. Resection and retrieval were complete.                           A 5 mm polyp was found in the sigmoid colon. The                            polyp was sessile. The polyp was removed with a                            cold snare. Resection and retrieval were complete.                           External (large) and internal (small) hemorrhoids                            were found during retroflexion and during digital                            exam. Complications:            No immediate complications. Estimated Blood Loss:     Estimated blood loss was minimal. Impression:               -  Two 1 to 2 mm polyps in the cecum, removed with a                            cold biopsy forceps. Resected and retrieved.                           - One 6 mm polyp at the hepatic flexure, removed                            with a cold snare. Resected and retrieved.                           - One 6 mm polyp in the descending colon, removed                            with a cold snare. Resected and retrieved.                           -  One 5 mm polyp in the sigmoid colon, removed with                            a cold snare. Resected and retrieved.                           - External and internal hemorrhoids. Recommendation:           - Patient has a contact number available for                            emergencies. The signs and symptoms of potential                            delayed complications were discussed with the                            patient. Return to normal activities tomorrow.                            Written discharge instructions were provided to the                            patient.                           - Resume previous diet.                           - Continue present medications.                           - Await pathology results.                           - Repeat colonoscopy is recommended for                            surveillance. The colonoscopy date will be                            determined after pathology results from today's                            exam become available for review. Jerene Bears, MD 02/02/2019 11:50:12 AM This report has been signed electronically.

## 2019-02-02 NOTE — Progress Notes (Signed)
Kateri Mc CMA temps and Rica Mote CMA vitals

## 2019-02-02 NOTE — Patient Instructions (Addendum)
YOU HAD AN ENDOSCOPIC PROCEDURE TODAY AT THE [facility]:   Refer to the procedure report that was given to you for any specific questions about what was found during the examination.  If the procedure report does not answer your questions, please call your gastroenterologist to clarify.  If you requested that your care partner not be given the details of your procedure findings, then the procedure report has been included in a sealed envelope for you to review at your convenience later.  YOU SHOULD EXPECT: Some feelings of bloating in the abdomen. Passage of more gas than usual.  Walking can help get rid of the air that was put into your GI tract during the procedure and reduce the bloating. If you had a lower endoscopy (such as a colonoscopy or flexible sigmoidoscopy) you may notice spotting of blood in your stool or on the toilet paper. If you underwent a bowel prep for your procedure, you may not have a normal bowel movement for a few days.  Please Note:  You might notice some irritation and congestion in your nose or some drainage.  This is from the oxygen used during your procedure.  There is no need for concern and it should clear up in a day or so.  SYMPTOMS TO REPORT IMMEDIATELY:   Following lower endoscopy (colonoscopy or flexible sigmoidoscopy):  Excessive amounts of blood in the stool  Significant tenderness or worsening of abdominal pains  Swelling of the abdomen that is new, acute  Fever of 100F or higher   Following upper endoscopy (EGD)  Vomiting of blood or coffee ground material  New chest pain or pain under the shoulder blades  Painful or persistently difficult swallowing  New shortness of breath  Fever of 100F or higher  Black, tarry-looking stools  For urgent or emergent issues, a gastroenterologist can be reached at any hour by calling [phone].   DIET:  We do recommend a small meal at first, but then you may proceed to your regular diet.  Drink  plenty of fluids but you should avoid alcoholic beverages for 24 hours.  ACTIVITY:  You should plan to take it easy for the rest of today and you should NOT DRIVE or use heavy machinery until tomorrow (because of the sedation medicines used during the test).    FOLLOW UP: Our staff will call the number listed on your records 48-72 hours following your procedure to check on you and address any questions or concerns that you may have regarding the information given to you following your procedure. If we do not reach you, we will leave a message.  We will attempt to reach you two times.  During this call, we will ask if you have developed any symptoms of COVID 19. If you develop any symptoms (ie: fever, flu-like symptoms, shortness of breath, cough etc.) before then, please call [phone].  If you test positive for Covid 19 in the 2 weeks post procedure, please call and report this information to us.    If any biopsies were taken you will be contacted by phone or by letter within the next 1-3 weeks.  Please call us at [phone] if you have not heard about the biopsies in 3 weeks.    SIGNATURES/CONFIDENTIALITY: You and/or your care partner have signed paperwork which will be entered into your electronic medical record.  These signatures attest to the fact that that the information above on your After Visit Summary has been reviewed and is   understood.  Full responsibility of the confidentiality of this discharge information lies with you and/or your care-partner.  Await pathology  Please read over handouts about gastritis, hemorrhoids and polyps  Continue your normal medications- including Omeprazole 40 mg daily- take 40 minutes before first meal of the day.

## 2019-02-02 NOTE — Op Note (Signed)
[place] Patient Name: Lee Greer Procedure Date: 02/02/2019 10:57 AM MRN: [id number] Endoscopist: Jerene Bears , MD Age: 53 Referring MD:  Date of Birth: Jan 15, 1966 Gender: Male Account #: [id number] Procedure:                Upper GI endoscopy Indications:              Follow-up of gastric ulcer with hemorrhage found on                            10/04/2018 and associated with H. Pylori (since                            treated) Medicines:                Monitored Anesthesia Care Procedure:                Pre-Anesthesia Assessment:                           - Prior to the procedure, a History and Physical                            was performed, and patient medications and                            allergies were reviewed. The patient's tolerance of                            previous anesthesia was also reviewed. The risks                            and benefits of the procedure and the sedation                            options and risks were discussed with the patient.                            All questions were answered, and informed consent                            was obtained. Prior Anticoagulants: The patient has                            taken no previous anticoagulant or antiplatelet                            agents. ASA Grade Assessment: II - A patient with                            mild systemic disease. After reviewing the risks                            and benefits, the patient was deemed in  satisfactory condition to undergo the procedure.                           After obtaining informed consent, the endoscope was                            passed under direct vision. Throughout the                            procedure, the patient's blood pressure, pulse, and                            oxygen saturations were monitored continuously. The                            Endoscope was introduced through the mouth, and                             advanced to the second part of duodenum. The upper                            GI endoscopy was accomplished without difficulty.                            The patient tolerated the procedure well. Scope In: Scope Out: Findings:                 The examined esophagus was normal.                           Patchy mild to moderate inflammation characterized                            by erythema, mucosal flattening and granularity was                            found in the gastric body (more patchy in this                            area), at the incisura (diffuse) and in the gastric                            antrum (diffuse). The mucosa has the appearance of                            intestinal metaplasia patchy throughout the body                            and antrum. Multiple biopsies were obtained in the                            gastric body (Jar 2), at the incisura (Jar 1) and  in the gastric antrum (Jar 1) with cold forceps for                            histology and Helicobacter pylori testing.                           There is no endoscopic evidence of ulceration in                            the incisura. There is a small scar present                            representing healed ulcer.                           The cardia and gastric fundus were normal on                            retroflexion.                           The examined duodenum was normal. Complications:            No immediate complications. Estimated Blood Loss:     Estimated blood loss was minimal. Impression:               - Normal esophagus.                           - Atrophic-appearing gastritis.                           - Normal examined duodenum.                           - Multiple biopsies were obtained in the gastric                            body, at the incisura and in the gastric antrum to                            exclude persistent H.  Pylori infection. Recommendation:           - Patient has a contact number available for                            emergencies. The signs and symptoms of potential                            delayed complications were discussed with the                            patient. Return to normal activities tomorrow.                            Written discharge instructions were provided to the  patient.                           - Resume previous diet.                           - Continue present medications including omeprazole                            40 mg once daily.                           - Await pathology results.                           - Repeat upper endoscopy for surveillance based on                            pathology results. Jerene Bears, MD 02/02/2019 11:46:55 AM This report has been signed electronically.

## 2019-02-05 ENCOUNTER — Telehealth: Payer: Self-pay | Admitting: Internal Medicine

## 2019-02-05 DIAGNOSIS — K254 Chronic or unspecified gastric ulcer with hemorrhage: Secondary | ICD-10-CM

## 2019-02-05 NOTE — Telephone Encounter (Signed)
Prescription that was called in was time release. But the coupon that was giving is not for time releasing. So the pt wants to know if he can take the medication that is not time releasing? He would like to have a call back and advised

## 2019-02-06 ENCOUNTER — Telehealth: Payer: Self-pay

## 2019-02-06 MED ORDER — OMEPRAZOLE 40 MG PO CPDR: 40.0000 mg | DELAYED_RELEASE_CAPSULE | Freq: Every day | ORAL | 3 refills | Status: DC

## 2019-02-06 NOTE — Telephone Encounter (Signed)
Called [phone] and left a messaged we tried to reach pt for a follow up call. maw

## 2019-02-06 NOTE — Telephone Encounter (Signed)
I have spoken to patient who says that his prescription for prilosec 40 mg was going to be 191 dollars at [facility]. I advised that this was on the 4 dollar list at [facility]. He works at [employer] so he would like me to send this there. Rx sent and he will call me if any issues.

## 2019-02-06 NOTE — Telephone Encounter (Signed)
1. Have you developed a fever since your procedure? No  2.   Have you had an respiratory symptoms (SOB or cough) since your procedure? No  3.   Have you tested positive for COVID 19 since your procedure No  4.   Have you had any family members/close contacts diagnosed with the COVID 19 since your procedure?  No   If yes to any of these questions please route to Joylene John, RN and Alphonsa Gin, RN.  Follow up Call-  Call back number 02/02/2019  Post procedure Call Back phone  # [phone]  Permission to leave phone message Yes  Some recent data might be hidden     Patient questions:  Do you have a fever, pain , or abdominal swelling? No. Pain Score  0 *  Have you tolerated food without any problems? Yes.    Have you been able to return to your normal activities? Yes.    Do you have any questions about your discharge instructions: Diet   No. Medications  No. Follow up visit  No.  Do you have questions or concerns about your Care? No.  Actions: * If pain score is 4 or above: No action needed, pain <4.

## 2019-02-07 ENCOUNTER — Encounter: Payer: Self-pay | Admitting: Internal Medicine

## 2019-02-07 ENCOUNTER — Telehealth: Payer: Self-pay | Admitting: Internal Medicine

## 2019-02-07 DIAGNOSIS — K254 Chronic or unspecified gastric ulcer with hemorrhage: Secondary | ICD-10-CM

## 2019-02-07 MED ORDER — OMEPRAZOLE 40 MG PO CPDR: 40.0000 mg | DELAYED_RELEASE_CAPSULE | Freq: Every day | ORAL | 3 refills | Status: DC

## 2019-02-07 NOTE — Telephone Encounter (Signed)
Rx sent 
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
## 2019-03-30 IMAGING — CT CT ABD-PELV W/ CM
2 of 5 series · 15 of 46 positions shown, 17 images · IV contrast (ISOVUE)
Comparison: 11/26/2009

CLINICAL DATA: States seen 3 times now for same complaint with
nausea and abdominal pain

EXAM:
CT ABDOMEN AND PELVIS WITH CONTRAST
TECHNIQUE: Multidetector CT imaging of the abdomen and pelvis was performed
using the standard protocol following bolus administration of
intravenous contrast.
CONTRAST:  100mL V9264G-RRR IOPAMIDOL (V9264G-RRR) INJECTION 61%

[Series 2: axial st · axial · 0.68mm/px · z∈[-436,-106]mm · 12 of 80 slices shown, 14 images]
[im 7/80  soft-tissue]
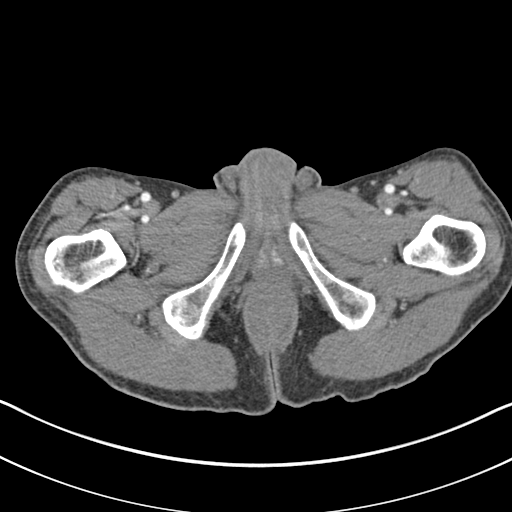
[im 7/80  bone]
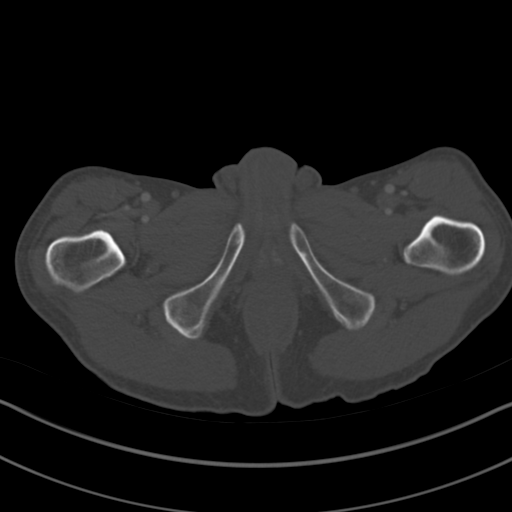
[im 13/80  soft-tissue]
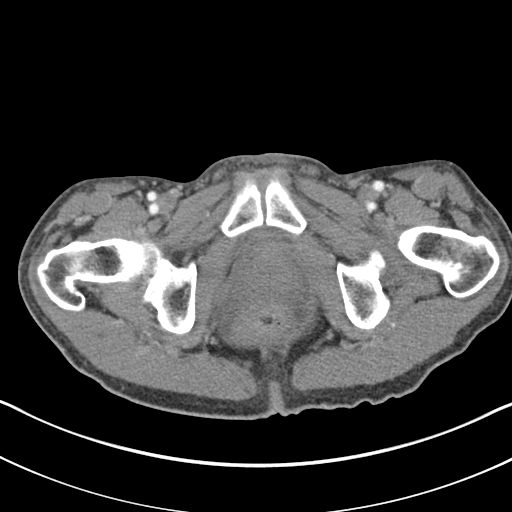
[im 19/80  soft-tissue]
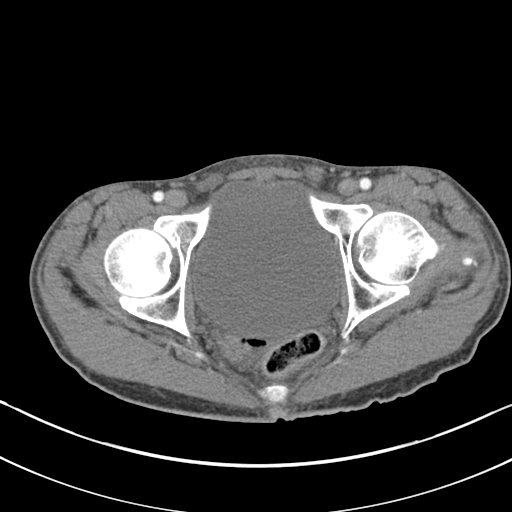
[im 25/80  soft-tissue]
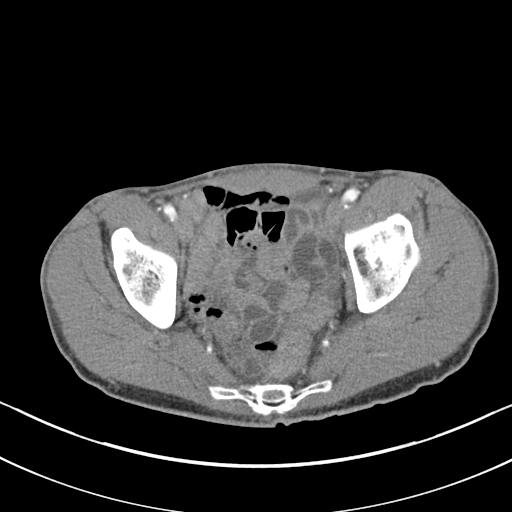
[im 31/80  soft-tissue]
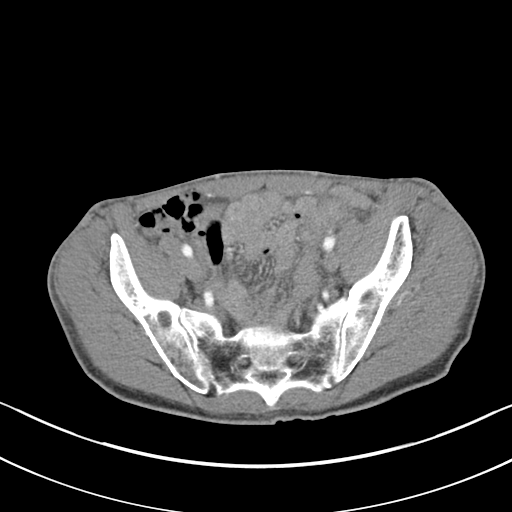
[im 37/80  soft-tissue]
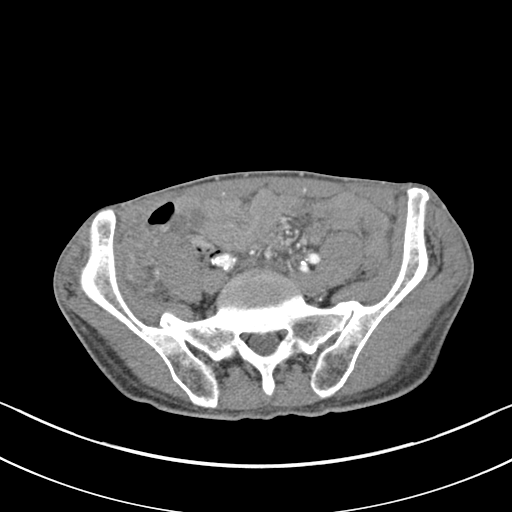
[im 43/80  soft-tissue]
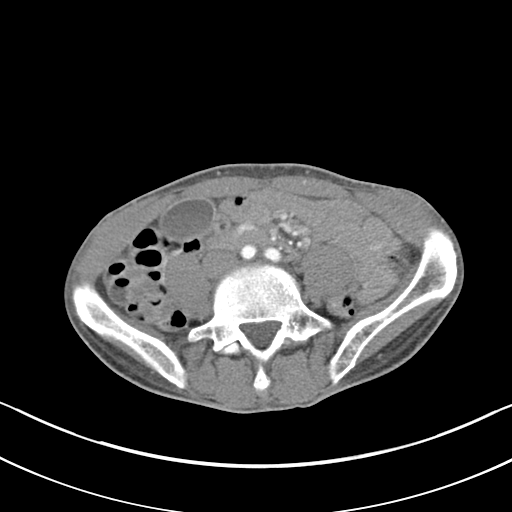
[im 49/80  soft-tissue]
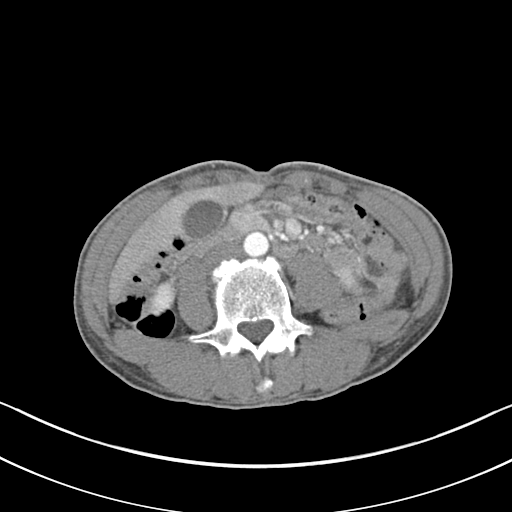
[im 55/80  soft-tissue]
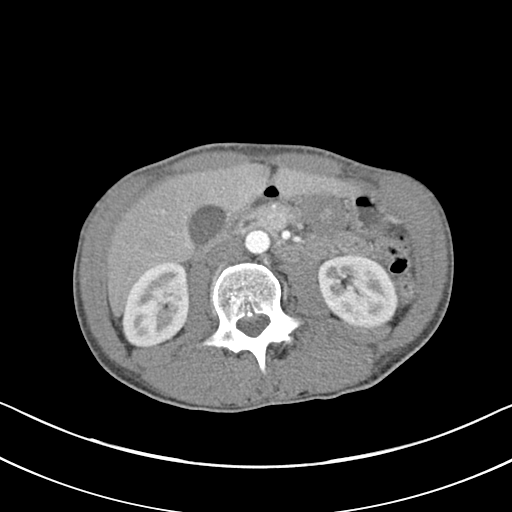
[im 55/80  bone]
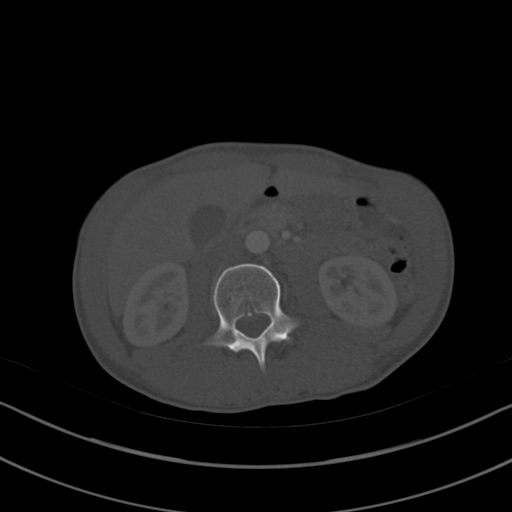
[im 61/80  soft-tissue]
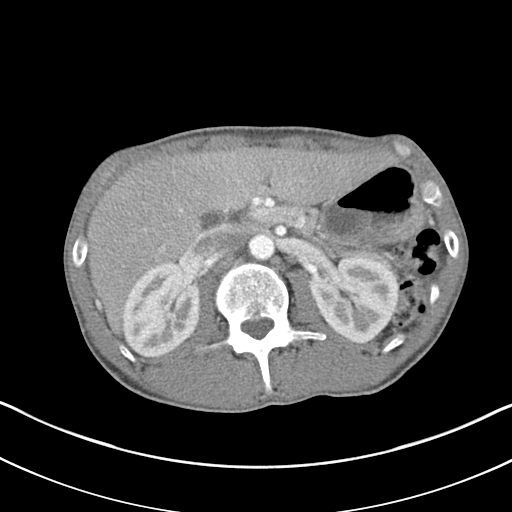
[im 67/80  soft-tissue]
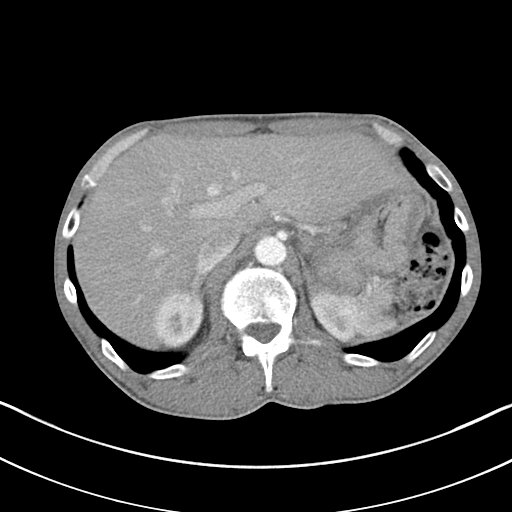
[im 73/80  soft-tissue]
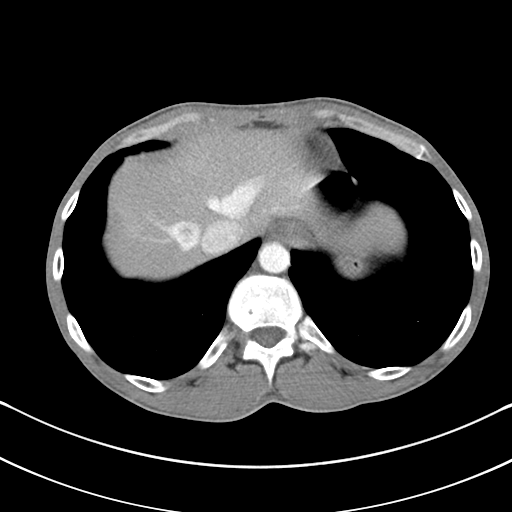

[Series 5: coronal st · coronal · 0.67mm/px · 3 of 71 slices shown]
[im 24/71  soft-tissue]
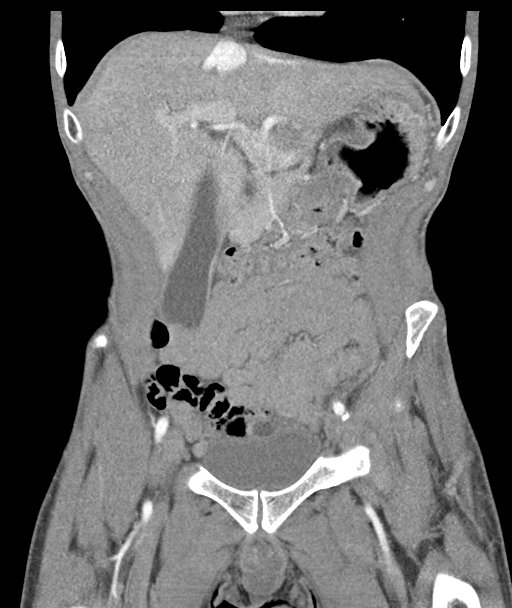
[im 32/71  soft-tissue]
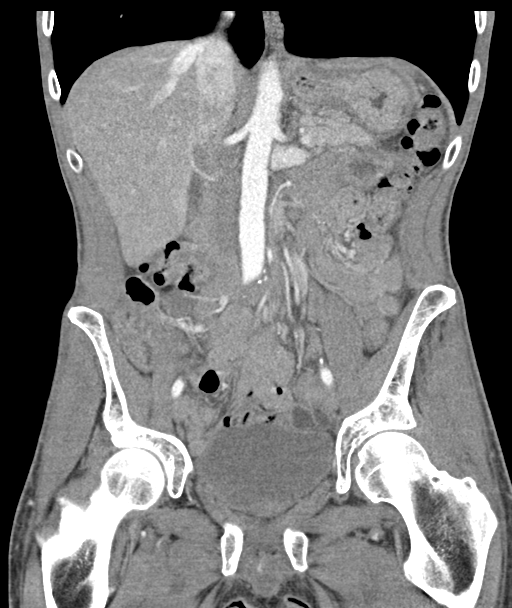
[im 39/71  soft-tissue]
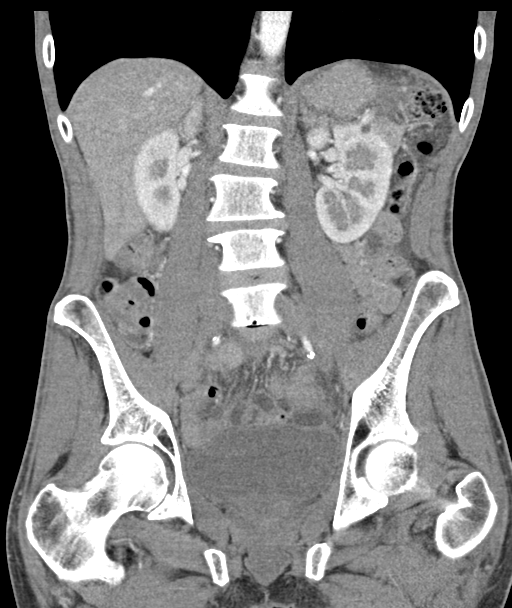

[15 of 46 positions shown; findings below may reference images not displayed]

FINDINGS: Lower chest: Lung bases are clear.  Old right rib fractures.

Hepatobiliary: No focal liver abnormality is seen. No gallstones,
gallbladder wall thickening, or biliary dilatation.

Pancreas: Unremarkable. No pancreatic ductal dilatation or
surrounding inflammatory changes.

Spleen: Small spleen size could be associated with sickle cell. No
focal lesions.

Adrenals/Urinary Tract: Adrenal glands are unremarkable. Kidneys are
normal, without renal calculi, focal lesion, or hydronephrosis.
Bladder is unremarkable.

Stomach/Bowel: Stomach is within normal limits. Appendix appears
normal. No evidence of bowel wall thickening, distention, or
inflammatory changes.

Vascular/Lymphatic: Aortic atherosclerosis. No enlarged abdominal or
pelvic lymph nodes.

Reproductive: Prostate is unremarkable.

Other: No abdominal wall hernia or abnormality. No abdominopelvic
ascites.

Musculoskeletal: No acute or significant osseous findings.
IMPRESSION: No acute process demonstrated in the abdomen or pelvis. Small spleen
size can be associated with sickle cell. No evidence of bowel
obstruction or inflammation.

## 2020-06-23 ENCOUNTER — Other Ambulatory Visit: Payer: Self-pay

## 2020-06-23 ENCOUNTER — Emergency Department (HOSPITAL_COMMUNITY)
Admission: EM | Admit: 2020-06-23 | Discharge: 2020-06-23 | Disposition: A | Discharge status: Home / Self Care | Payer: Self-pay | Attending: Emergency Medicine | Admitting: Emergency Medicine

## 2020-06-23 DIAGNOSIS — E1143 Type 2 diabetes mellitus with diabetic autonomic (poly)neuropathy: Secondary | ICD-10-CM | POA: Insufficient documentation

## 2020-06-23 DIAGNOSIS — R112 Nausea with vomiting, unspecified: Secondary | ICD-10-CM | POA: Insufficient documentation

## 2020-06-23 DIAGNOSIS — K3184 Gastroparesis: Secondary | ICD-10-CM | POA: Insufficient documentation

## 2020-06-23 LAB — COMPREHENSIVE METABOLIC PANEL
ALT: 16 U/L (ref 0–44)
AST: 18 U/L (ref 15–41)
Albumin: 5.4 g/dL — ABNORMAL HIGH (ref 3.5–5.0)
Alkaline Phosphatase: 80 U/L (ref 38–126)
Anion gap: 16 — ABNORMAL HIGH (ref 5–15)
BUN: 16 mg/dL (ref 6–20)
CO2: 24 mmol/L (ref 22–32)
Calcium: 10.2 mg/dL (ref 8.9–10.3)
Chloride: 105 mmol/L (ref 98–111)
Creatinine, Ser: 0.95 mg/dL (ref 0.61–1.24)
GFR, Estimated: >60 mL/min (ref >60)
Glucose, Bld: 116 mg/dL — ABNORMAL HIGH (ref 70–99)
Potassium: 4.1 mmol/L (ref 3.5–5.1)
Sodium: 145 mmol/L (ref 135–145)
Total Bilirubin: 0.7 mg/dL (ref 0.3–1.2)
Total Protein: 9.8 g/dL — ABNORMAL HIGH (ref 6.5–8.1)

## 2020-06-23 LAB — URINALYSIS, ROUTINE W REFLEX MICROSCOPIC
Bacteria, UA: NONE SEEN
Bilirubin Urine: NEGATIVE
Glucose, UA: NEGATIVE mg/dL
Ketones, ur: 5 mg/dL — AB
Leukocytes,Ua: NEGATIVE
Nitrite: NEGATIVE
Protein, ur: 100 mg/dL — AB
Specific Gravity, Urine: 1.024 (ref 1.005–1.030)
pH: 5 (ref 5.0–8.0)

## 2020-06-23 LAB — CBC
HCT: 42 % (ref 39.0–52.0)
Hemoglobin: 14.4 g/dL (ref 13.0–17.0)
MCH: 30.8 pg (ref 26.0–34.0)
MCHC: 34.3 g/dL (ref 30.0–36.0)
MCV: 89.7 fL (ref 80.0–100.0)
Platelets: 273 10*3/uL (ref 150–400)
RBC: 4.68 MIL/uL (ref 4.22–5.81)
RDW: 13.4 % (ref 11.5–15.5)
WBC: 14.5 10*3/uL — ABNORMAL HIGH (ref 4.0–10.5)
nRBC: 0 % (ref 0.0–0.2)

## 2020-06-23 LAB — LIPASE, BLOOD: Lipase: 21 U/L (ref 11–51)

## 2020-06-23 MED ORDER — ONDANSETRON HCL 4 MG PO TABS: 4.0000 mg | ORAL_TABLET | Freq: Four times a day (QID) | ORAL | 0 refills | Status: DC

## 2020-06-23 MED ORDER — ONDANSETRON HCL 4 MG/2ML IJ SOLN
4.0000 mg | Freq: Once | INTRAMUSCULAR | Status: AC
  Administered 2020-06-23: 4 mg via INTRAVENOUS
  Filled 2020-06-23: qty 2

## 2020-06-23 MED ORDER — KETOROLAC TROMETHAMINE 15 MG/ML IJ SOLN
15.0000 mg | Freq: Once | INTRAMUSCULAR | Status: AC
  Administered 2020-06-23: 15 mg via INTRAVENOUS
  Filled 2020-06-23: qty 1

## 2020-06-23 MED ORDER — SODIUM CHLORIDE 0.9 % IV BOLUS
1000.0000 mL | Freq: Once | INTRAVENOUS | Status: AC
  Administered 2020-06-23: 1000 mL via INTRAVENOUS

## 2020-06-23 NOTE — Discharge Instructions (Signed)
Please return for any problem.  Follow-up with your regular care provider as instructed. °

## 2020-06-23 NOTE — ED Triage Notes (Signed)
Patient reports to the ER for abdominal pain, nausea and emesis. Hx of diabetes and gastroparesis.

## 2020-06-23 NOTE — ED Provider Notes (Signed)
[facility] DEPT Provider Note   CSN: [id number] Arrival date & time: 06/23/20  1353     History Chief Complaint  Patient presents with  . Abdominal Pain  . Nausea  . Emesis    Lee Greer is a 54 y.o. male.  54 year old male with prior medical history as detailed below presents for evaluation of reported nausea and vomiting.  Patient reports multiple episodes of vomiting over the course last 24 hours.  He denies abdominal pain.  He denies fever.  He reports that he is concerned that he is dehydrated.  Patient is requesting IV fluids.  He also request anti-nausea medication.    The history is provided by the patient and medical records.  Emesis Severity:  Mild Duration:  1 day Timing:  Constant Quality:  Stomach contents Chronicity:  New Recent urination:  Normal Relieved by:  Nothing Worsened by:  Nothing Ineffective treatments:  None tried Associated symptoms: no abdominal pain and no fever        Past Medical History:  Diagnosis Date  . Acute kidney injury ([place]) 04/13/2017   related to "dehydration" (04/14/2017)  . Cannabinoid hyperemesis syndrome ([place])   . Diabetes mellitus without complication ([place])   . Esophageal tear   . Gastric ulcer   . Gastritis with intestinal metaplasia of stomach   . Gastroparesis   . H. pylori infection   . Hypokalemia   . Malnutrition of moderate degree (Gomez: 60% to less than 75% of standard weight) ([place])   . Posthemorrhagic anemia   . Upper GI bleed     Patient Active Problem List   Diagnosis Date Noted  . Upper GI bleed 10/04/2018  . Acute GI bleeding   . Anemia, posthemorrhagic, acute   . Acute gastric ulcer with hemorrhage   . Acute gastritis without hemorrhage   . UGI bleed 10/03/2018  . Diabetes mellitus without complication ([place]) 02/63/7858  . Hypokalemia 10/03/2018  . Malnutrition of moderate degree 09/28/2018  . Hematemesis 09/26/2018  . UGIB (upper gastrointestinal  bleed) 09/26/2018  . AKI (acute kidney injury) ([place]) 04/13/2017  . Dehydration 04/13/2017  . Nausea with vomiting 02/17/2015    Past Surgical History:  Procedure Laterality Date  . BIOPSY  10/04/2018   Procedure: BIOPSY;  Surgeon: Jerene Bears, MD;  Location: [facility] ENDOSCOPY;  Service: Endoscopy;;  . ESOPHAGOGASTRODUODENOSCOPY (EGD) WITH PROPOFOL N/A 09/27/2018   Procedure: ESOPHAGOGASTRODUODENOSCOPY (EGD) WITH PROPOFOL;  Surgeon: Juanita Craver, MD;  Location: [facility] ENDOSCOPY;  Service: Endoscopy;  Laterality: N/A;  . ESOPHAGOGASTRODUODENOSCOPY (EGD) WITH PROPOFOL N/A 10/04/2018   Procedure: ESOPHAGOGASTRODUODENOSCOPY (EGD) WITH PROPOFOL;  Surgeon: Jerene Bears, MD;  Location: [facility] ENDOSCOPY;  Service: Endoscopy;  Laterality: N/A;  . HOT HEMOSTASIS N/A 10/04/2018   Procedure: HOT HEMOSTASIS (ARGON PLASMA COAGULATION/BICAP);  Surgeon: Jerene Bears, MD;  Location: [facility] ENDOS[facility];  Service: Endoscopy;  Laterality: N/A;       Family History  Problem Relation Age of Onset  . CAD Mother   . Diabetes Neg Hx   . Stroke Neg Hx   . Colon cancer Neg Hx   . Esophageal cancer Neg Hx   . Ulcerative colitis Neg Hx   . Stomach cancer Neg Hx     Social History   Tobacco Use  . Smoking status: Never Smoker  . Smokeless tobacco: Never Used  Vaping Use  . Vaping Use: Never used  Substance Use Topics  . Alcohol use: Not Currently    Comment: 04/14/2017 "sober since  03/2008"  . Drug use: Not Currently    Types: Marijuana    Comment: last used 1 month ago    Home Medications Prior to Admission medications   Medication Sig Start Date End Date Taking? Authorizing Provider  acetaminophen (TYLENOL) 500 MG tablet Take 500 mg by mouth every 6 (six) hours as needed for moderate pain or headache.   Yes [provider]  acetaminophen (TYLENOL) 325 MG tablet Take 2 tablets (650 mg total) by mouth every 6 (six) hours as needed for mild pain (or Fever >/= 101). Patient not taking: Reported on 11/06/2018  10/07/18   Nita Sells, MD  omeprazole (PRILOSEC) 40 MG capsule Take 1 capsule (40 mg total) by mouth daily. Patient not taking: Reported on 06/23/2020 02/07/19   Pyrtle, Lajuan Lines, MD  ondansetron (ZOFRAN) 4 MG tablet Take 1 tablet (4 mg total) by mouth every 6 (six) hours. 06/23/20   Valarie Merino, MD    Allergies    Patient has no known allergies.  Review of Systems   Review of Systems  Constitutional: Negative for fever.  Gastrointestinal: Positive for vomiting. Negative for abdominal pain.  All other systems reviewed and are negative.   Physical Exam Updated Vital Signs BP 119/80 (BP Location: Right Arm)   Pulse 79   Temp 98.4 F (36.9 C) (Oral)   Resp 16   Ht 6\' 1"  (1.854 m)   Wt 70.3 kg   SpO2 100%   BMI 20.45 kg/m   Physical Exam Vitals and nursing note reviewed.  Constitutional:      General: He is not in acute distress.    Appearance: He is well-developed.  HENT:     Head: Normocephalic and atraumatic.  Eyes:     Conjunctiva/sclera: Conjunctivae normal.     Pupils: Pupils are equal, round, and reactive to light.  Cardiovascular:     Rate and Rhythm: Normal rate and regular rhythm.     Heart sounds: Normal heart sounds.  Pulmonary:     Effort: Pulmonary effort is normal. No respiratory distress.     Breath sounds: Normal breath sounds.  Abdominal:     General: There is no distension.     Palpations: Abdomen is soft.     Tenderness: There is no abdominal tenderness.  Musculoskeletal:        General: No deformity. Normal range of motion.     Cervical back: Normal range of motion and neck supple.  Skin:    General: Skin is warm and dry.  Neurological:     Mental Status: He is alert and oriented to person, place, and time.     ED Results / Procedures / Treatments   Labs (all labs ordered are listed, but only abnormal results are displayed) Labs Reviewed  COMPREHENSIVE METABOLIC PANEL - Abnormal; Notable for the following components:      Result  Value   Glucose, Bld 116 (*)    Total Protein 9.8 (*)    Albumin 5.4 (*)    Anion gap 16 (*)    All other components within normal limits  CBC - Abnormal; Notable for the following components:   WBC 14.5 (*)    All other components within normal limits  URINALYSIS, ROUTINE W REFLEX MICROSCOPIC - Abnormal; Notable for the following components:   Hgb urine dipstick MODERATE (*)    Ketones, ur 5 (*)    Protein, ur 100 (*)    All other components within normal limits  LIPASE, BLOOD  EKG None  Radiology No results found.  Procedures Procedures (including critical care time)  Medications Ordered in ED Medications  sodium chloride 0.9 % bolus 1,000 mL (0 mLs Intravenous Stopped 06/23/20 1659)  ondansetron (ZOFRAN) injection 4 mg (4 mg Intravenous Given 06/23/20 1519)  ketorolac (TORADOL) 15 MG/ML injection 15 mg (15 mg Intravenous Given 06/23/20 1549)    ED Course  I have reviewed the triage vital signs and the nursing notes.  Pertinent labs & imaging results that were available during my care of the patient were reviewed by me and considered in my medical decision making (see chart for details).    MDM Rules/Calculators/A&P                          MDM  Screen complete  Lee Greer was evaluated in Emergency Department on 06/23/2020 for the symptoms described in the history of present illness. He was evaluated in the context of the global COVID-19 pandemic, which necessitated consideration that the patient might be at risk for infection with the SARS-CoV-2 virus that causes COVID-19. Institutional protocols and algorithms that pertain to the evaluation of patients at risk for COVID-19 are in a state of rapid change based on information released by regulatory bodies including the CDC and federal and state organizations. These policies and algorithms were followed during the patient's care in the ED.  Patient is presenting for evaluation of reported nausea and  vomiting.  Patient's symptoms seem to be improved upon the time of his initial evaluation in ED.  Screening labs did not reveal significant acute pathology.  Patient does feel improved following his ED evaluation.  He now desires discharge home.  Importance of close follow-up is stressed.  Strict return precautions given and understood.   Final Clinical Impression(s) / ED Diagnoses Final diagnoses:  Nausea and vomiting, intractability of vomiting not specified, unspecified vomiting type    Rx / DC Orders ED Discharge Orders         Ordered    ondansetron (ZOFRAN) 4 MG tablet  Every 6 hours        06/23/20 1829           Valarie Merino, MD 06/23/20 1900

## 2020-06-24 ENCOUNTER — Emergency Department (HOSPITAL_COMMUNITY)
Admission: EM | Admit: 2020-06-24 | Discharge: 2020-06-24 | Disposition: A | Discharge status: Home / Self Care | Payer: Self-pay | Attending: Emergency Medicine | Admitting: Emergency Medicine

## 2020-06-24 ENCOUNTER — Encounter (HOSPITAL_COMMUNITY): Payer: Self-pay | Admitting: Emergency Medicine

## 2020-06-24 DIAGNOSIS — R112 Nausea with vomiting, unspecified: Secondary | ICD-10-CM

## 2020-06-24 DIAGNOSIS — E119 Type 2 diabetes mellitus without complications: Secondary | ICD-10-CM | POA: Insufficient documentation

## 2020-06-24 LAB — COMPREHENSIVE METABOLIC PANEL WITH GFR
ALT: <5 U/L (ref 0–44)
AST: 20 U/L (ref 15–41)
Albumin: 5.1 g/dL — ABNORMAL HIGH (ref 3.5–5.0)
Alkaline Phosphatase: 74 U/L (ref 38–126)
Anion gap: 16 — ABNORMAL HIGH (ref 5–15)
BUN: 21 mg/dL — ABNORMAL HIGH (ref 6–20)
CO2: 23 mmol/L (ref 22–32)
Calcium: 9.8 mg/dL (ref 8.9–10.3)
Chloride: 106 mmol/L (ref 98–111)
Creatinine, Ser: 1.05 mg/dL (ref 0.61–1.24)
GFR, Estimated: >60 mL/min
Glucose, Bld: 128 mg/dL — ABNORMAL HIGH (ref 70–99)
Potassium: 3.7 mmol/L (ref 3.5–5.1)
Sodium: 145 mmol/L (ref 135–145)
Total Bilirubin: 0.9 mg/dL (ref 0.3–1.2)
Total Protein: 9.3 g/dL — ABNORMAL HIGH (ref 6.5–8.1)

## 2020-06-24 LAB — CBC
HCT: 40.3 % (ref 39.0–52.0)
Hemoglobin: 14.1 g/dL (ref 13.0–17.0)
MCH: 31.2 pg (ref 26.0–34.0)
MCHC: 35 g/dL (ref 30.0–36.0)
MCV: 89.2 fL (ref 80.0–100.0)
Platelets: 258 K/uL (ref 150–400)
RBC: 4.52 MIL/uL (ref 4.22–5.81)
RDW: 13.4 % (ref 11.5–15.5)
WBC: 13.1 K/uL — ABNORMAL HIGH (ref 4.0–10.5)
nRBC: 0 % (ref 0.0–0.2)

## 2020-06-24 LAB — LIPASE, BLOOD: Lipase: 25 U/L (ref 11–51)

## 2020-06-24 MED ORDER — SODIUM CHLORIDE 0.9 % IV BOLUS
1000.0000 mL | Freq: Once | INTRAVENOUS | Status: AC
  Administered 2020-06-24: 1000 mL via INTRAVENOUS

## 2020-06-24 MED ORDER — MORPHINE SULFATE (PF) 4 MG/ML IV SOLN
4.0000 mg | Freq: Once | INTRAVENOUS | Status: AC
  Administered 2020-06-24: 4 mg via INTRAVENOUS
  Filled 2020-06-24: qty 1

## 2020-06-24 MED ORDER — ONDANSETRON 4 MG PO TBDP: 4.0000 mg | ORAL_TABLET | Freq: Three times a day (TID) | ORAL | 0 refills | Status: DC | PRN

## 2020-06-24 MED ORDER — ONDANSETRON HCL 4 MG/2ML IJ SOLN
4.0000 mg | Freq: Once | INTRAMUSCULAR | Status: AC
  Administered 2020-06-24: 4 mg via INTRAVENOUS
  Filled 2020-06-24: qty 2

## 2020-06-24 MED ORDER — ONDANSETRON 4 MG PO TBDP: 4.0000 mg | ORAL_TABLET | Freq: Once | ORAL | Status: DC | PRN

## 2020-06-24 NOTE — ED Notes (Signed)
Requested urine from patient. 

## 2020-06-24 NOTE — ED Notes (Signed)
Patient given ice water for PO challenge.

## 2020-06-24 NOTE — ED Notes (Signed)
An After Visit Summary was printed and given to the patient. Discharge instructions given and no further questions at this time.  

## 2020-06-24 NOTE — ED Provider Notes (Signed)
[facility] DEPT Provider Note   CSN: [id number] Arrival date & time: 06/24/20  0035    History Chief Complaint  Patient presents with  . Emesis    Lee Greer is a 54 y.o. male with past medical history significant for cannabinoid hyperemesis, diabetes, Gastric ulcer, gastroparesis, Upper GI bleed who presents for evaluation emesis.  Multiple episodes of NBNB emesis over the last 24 hours.  Seen here in the emergency department 12 hours ago and was discharged.  Patient states he went home took 1 dose of Zofran however had immediate emesis.  Patient states his was not a disintegrating Zofran.  He denies any recent cannabis use.  His last bowel movement was 3 days ago.  He is passing flatulence.  States he has normal bowel movements every 3 to 4 days.  He has no associated abdominal pain.  Denies headache, lightness, dizziness, fever, chills, chest pain, shortness of breath, abdominal pain, diarrhea, dysuria, weakness.  Denies aggravating or alleviating factors.  No melanotic or prior blood in his stools.  No hematemesis.  He denies any alcohol use, cannabis use, NSAID use.  He is no longer on omeprazole.  History obtained from patient and past medical records.  No interpreter used.  HPI     Past Medical History:  Diagnosis Date  . Acute kidney injury ([place]) 04/13/2017   related to "dehydration" (04/14/2017)  . Cannabinoid hyperemesis syndrome   . Diabetes mellitus without complication ([place])   . Esophageal tear   . Gastric ulcer   . Gastritis with intestinal metaplasia of stomach   . Gastroparesis   . H. pylori infection   . Hypokalemia   . Malnutrition of moderate degree (Gomez: 60% to less than 75% of standard weight) ([place])   . Posthemorrhagic anemia   . Upper GI bleed     Patient Active Problem List   Diagnosis Date Noted  . Upper GI bleed 10/04/2018  . Acute GI bleeding   . Anemia, posthemorrhagic, acute   . Acute gastric ulcer with  hemorrhage   . Acute gastritis without hemorrhage   . UGI bleed 10/03/2018  . Diabetes mellitus without complication ([place]) 81/85/6314  . Hypokalemia 10/03/2018  . Malnutrition of moderate degree 09/28/2018  . Hematemesis 09/26/2018  . UGIB (upper gastrointestinal bleed) 09/26/2018  . AKI (acute kidney injury) ([place]) 04/13/2017  . Dehydration 04/13/2017  . Nausea with vomiting 02/17/2015    Past Surgical History:  Procedure Laterality Date  . BIOPSY  10/04/2018   Procedure: BIOPSY;  Surgeon: Jerene Bears, MD;  Location: [facility] ENDOSCOPY;  Service: Endoscopy;;  . ESOPHAGOGASTRODUODENOSCOPY (EGD) WITH PROPOFOL N/A 09/27/2018   Procedure: ESOPHAGOGASTRODUODENOSCOPY (EGD) WITH PROPOFOL;  Surgeon: Juanita Craver, MD;  Location[facility] ENDOSCOPY;  Service: Endoscopy;  Laterality: N/A;  . ESOPHAGOGASTRODUODENOSCOPY (EGD) WITH PROPOFOL N/A 10/04/2018   Procedure: ESOPHAGOGASTRODUODENOSCOPY (EGD) WITH PROPOFOL;  Surgeon: Jerene Bears, MD;  Location: [facility] ENDOSCOPY;  Service: Endoscopy;  Laterality: N/A;  . HOT HEMOSTASIS N/A 10/04/2018   Procedure: HOT HEMOSTASIS (ARGON PLASMA COAGULATION/BICAP);  Surgeon: Jerene Bears, MD;  Location: [facility] ENDOSCOPY;  Service: Endoscopy;  Laterality: N/A;       Family History  Problem Relation Age of Onset  . CAD Mother   . Diabetes Neg Hx   . Stroke Neg Hx   . Colon cancer Neg Hx   . Esophageal cancer Neg Hx   . Ulcerative colitis Neg Hx   . Stomach cancer Neg Hx     Social  History   Tobacco Use  . Smoking status: Never Smoker  . Smokeless tobacco: Never Used  Vaping Use  . Vaping Use: Never used  Substance Use Topics  . Alcohol use: Not Currently    Comment: 04/14/2017 "sober since 03/2008"  . Drug use: Not Currently    Types: Marijuana    Comment: last used 1 month ago    Home Medications Prior to Admission medications   Medication Sig Start Date End Date Taking? Authorizing Provider  acetaminophen (TYLENOL) 500 MG tablet Take 500 mg by mouth every 6  (six) hours as needed for moderate pain or headache.   Yes [provider]  Multiple Vitamin (MULTIVITAMIN WITH MINERALS) TABS tablet Take 1 tablet by mouth daily.   Yes [provider]  ondansetron (ZOFRAN) 4 MG tablet Take 1 tablet (4 mg total) by mouth every 6 (six) hours. 06/23/20  Yes Valarie Merino, MD  acetaminophen (TYLENOL) 325 MG tablet Take 2 tablets (650 mg total) by mouth every 6 (six) hours as needed for mild pain (or Fever >/= 101). Patient not taking: Reported on 11/06/2018 10/07/18   Nita Sells, MD  omeprazole (PRILOSEC) 40 MG capsule Take 1 capsule (40 mg total) by mouth daily. Patient not taking: Reported on 06/23/2020 02/07/19   Jerene Bears, MD  ondansetron (ZOFRAN ODT) 4 MG disintegrating tablet Take 1 tablet (4 mg total) by mouth every 8 (eight) hours as needed for nausea or vomiting. 06/24/20   Dewon Mendizabal A, PA-C    Allergies    Patient has no known allergies.  Review of Systems   Review of Systems  Constitutional: Negative.   HENT: Negative.   Respiratory: Negative.   Cardiovascular: Negative.   Gastrointestinal: Positive for nausea and vomiting. Negative for abdominal distention, abdominal pain, anal bleeding, blood in stool, constipation, diarrhea and rectal pain.  Genitourinary: Negative.   Musculoskeletal: Negative.   Skin: Negative.   Neurological: Negative.   All other systems reviewed and are negative.   Physical Exam Updated Vital Signs BP 131/87 (BP Location: Left Arm)   Pulse 78   Temp 99.9 F (37.7 C) (Oral)   Resp 16   Ht 6\' 1"  (1.854 m)   Wt 70.3 kg   SpO2 100%   BMI 20.45 kg/m   Physical Exam Vitals and nursing note reviewed.  Constitutional:      General: He is not in acute distress.    Appearance: He is well-developed. He is not ill-appearing, toxic-appearing or diaphoretic.  HENT:     Head: Normocephalic and atraumatic.     Nose: Nose normal.     Mouth/Throat:     Mouth: Mucous membranes are  moist.  Eyes:     Pupils: Pupils are equal, round, and reactive to light.  Cardiovascular:     Rate and Rhythm: Normal rate and regular rhythm.     Pulses: Normal pulses.          Radial pulses are 2+ on the right side and 2+ on the left side.       Dorsalis pedis pulses are 2+ on the right side and 2+ on the left side.     Heart sounds: Normal heart sounds.  Pulmonary:     Effort: Pulmonary effort is normal. No respiratory distress.     Breath sounds: Normal breath sounds.  Abdominal:     General: Bowel sounds are normal. There is no distension.     Palpations: Abdomen is soft.  Tenderness: There is no abdominal tenderness. There is no right CVA tenderness, left CVA tenderness, guarding or rebound.     Hernia: No hernia is present.  Musculoskeletal:        General: No swelling, tenderness, deformity or signs of injury. Normal range of motion.     Cervical back: Normal range of motion and neck supple.     Right lower leg: No edema.     Left lower leg: No edema.     Comments: Moves all 4 extremities without difficulty. Compartments soft  Skin:    General: Skin is warm and dry.     Capillary Refill: Capillary refill takes less than 2 seconds.     Coloration: Skin is not cyanotic, jaundiced, mottled or pale.  Neurological:     General: No focal deficit present.     Mental Status: He is alert and oriented to person, place, and time.     Cranial Nerves: Cranial nerves are intact.     Sensory: Sensation is intact.     Motor: Motor function is intact.     Gait: Gait is intact.     ED Results / Procedures / Treatments   Labs (all labs ordered are listed, but only abnormal results are displayed) Labs Reviewed  COMPREHENSIVE METABOLIC PANEL - Abnormal; Notable for the following components:      Result Value   Glucose, Bld 128 (*)    BUN 21 (*)    Total Protein 9.3 (*)    Albumin 5.1 (*)    Anion gap 16 (*)    All other components within normal limits  CBC - Abnormal; Notable  for the following components:   WBC 13.1 (*)    All other components within normal limits  LIPASE, BLOOD    EKG None  Radiology No results found.  Procedures Procedures (including critical care time)  Medications Ordered in ED Medications  ondansetron (ZOFRAN-ODT) disintegrating tablet 4 mg (has no administration in time range)  sodium chloride 0.9 % bolus 1,000 mL (0 mLs Intravenous Stopped 06/24/20 0915)  ondansetron (ZOFRAN) injection 4 mg (4 mg Intravenous Given 06/24/20 0800)  morphine 4 MG/ML injection 4 mg (4 mg Intravenous Given 06/24/20 0914)  sodium chloride 0.9 % bolus 1,000 mL (1,000 mLs Intravenous New Bag/Given 06/24/20 0944)   ED Course  I have reviewed the triage vital signs and the nursing notes.  Pertinent labs & imaging results that were available during my care of the patient were reviewed by me and considered in my medical decision making (see chart for details).  54 year old presents for evaluation of emesis seen yesterday and returned 4 hours after discharge.  Apparently picked up the Zofran which was not ODT, this was a tablet which patient proceeded to vomit up.. Afebrile, non septic no ill appearing.  Seen here yesterday for similar symptoms.  History of cannabinoid hyperemesis however denies any recent use.  No associated nominal pain.  Heart and lungs clear.  Abdomen soft, nontender.  Has had bowel movement in 3 days.  Plan on labs and reassess.  No upper respiratory symptoms, chest pain, shortness of breath, suspicion for atypical ACS, PE or dissection..  No hematemesis, melena, bright blood per rectum.  Low suspicion for acute GI bleed.  Patient declines any imaging at this time.  Labs personally reviewed interpreted:  CBC 13.1 improved from yesterday Metabolic panel with mild hyperglycemia to 128, anion gap 16 Lipase 25 UA reviewed from 12 hours ago which does not  show evidence of infectious process.  Patient reassessed.  Admits to persistent nausea  without emesis.  States he does feel he has some generalized abdominal cramping now.  No urinary complaints.  Discussed imaging given second visit for emesis.  Patient continues to decline any imaging at this time.  Discussed with patient possible this obstruction, infectious process, vascular etiology.  Patient voiced understanding continues to decline imaging  Patient reassessed.  Tolerating p.o. intake without difficulty.  Nausea significantly improved.  Continue to decline any abdominal imaging at this time.  Did have a bowel movement here in the emergency department.  Low suspicion for obstruction.  Reevaluation his abdomen is soft, nontender.  No rebound or guarding.  I discussed close follow-up with PCP.  He will return for any worsening symptoms.  Patient is nontoxic, nonseptic appearing, in no apparent distress.  Patient's pain and other symptoms adequately managed in emergency department.  Fluid bolus given.  Labs, imaging and vitals reviewed.  Patient does not meet the SIRS or Sepsis criteria.  On repeat exam patient does not have a surgical abdomin and there are no peritoneal signs.  No indication of appendicitis, bowel obstruction, bowel perforation, cholecystitis, diverticulitis.  Patient discharged home with symptomatic treatment and given strict instructions for follow-up with their primary care physician.  I have also discussed reasons to return immediately to the ER.  Patient expresses understanding and agrees with plan.     MDM Rules/Calculators/A&P                           Final Clinical Impression(s) / ED Diagnoses Final diagnoses:  Non-intractable vomiting with nausea, unspecified vomiting type    Rx / DC Orders ED Discharge Orders         Ordered    ondansetron (ZOFRAN ODT) 4 MG disintegrating tablet  Every 8 hours PRN        06/24/20 1122           Marque Rademaker A, PA-C 06/24/20 1132    Fredia Sorrow, MD 06/25/20 [phone]

## 2020-06-24 NOTE — Discharge Instructions (Signed)
I have written you for the Zofran that is a disintegrating tablet.  If you have any new worsening symptoms please seek reevaluation the emergency department

## 2021-01-13 ENCOUNTER — Emergency Department (HOSPITAL_COMMUNITY)
Admission: EM | Admit: 2021-01-13 | Discharge: 2021-01-13 | Disposition: A | Discharge status: Home / Self Care | Payer: Self-pay | Attending: Emergency Medicine | Admitting: Emergency Medicine

## 2021-01-13 ENCOUNTER — Other Ambulatory Visit: Payer: Self-pay

## 2021-01-13 ENCOUNTER — Emergency Department (HOSPITAL_COMMUNITY): Payer: Self-pay

## 2021-01-13 ENCOUNTER — Encounter (HOSPITAL_COMMUNITY): Payer: Self-pay | Admitting: Emergency Medicine

## 2021-01-13 DIAGNOSIS — R110 Nausea: Secondary | ICD-10-CM

## 2021-01-13 DIAGNOSIS — R112 Nausea with vomiting, unspecified: Secondary | ICD-10-CM | POA: Insufficient documentation

## 2021-01-13 DIAGNOSIS — E860 Dehydration: Secondary | ICD-10-CM | POA: Insufficient documentation

## 2021-01-13 DIAGNOSIS — E119 Type 2 diabetes mellitus without complications: Secondary | ICD-10-CM | POA: Insufficient documentation

## 2021-01-13 LAB — COMPREHENSIVE METABOLIC PANEL
ALT: 25 U/L (ref 0–44)
AST: 26 U/L (ref 15–41)
Albumin: 5.8 g/dL — ABNORMAL HIGH (ref 3.5–5.0)
Alkaline Phosphatase: 78 U/L (ref 38–126)
Anion gap: 11 (ref 5–15)
BUN: 28 mg/dL — ABNORMAL HIGH (ref 6–20)
CO2: 23 mmol/L (ref 22–32)
Calcium: 10.5 mg/dL — ABNORMAL HIGH (ref 8.9–10.3)
Chloride: 107 mmol/L (ref 98–111)
Creatinine, Ser: 1.13 mg/dL (ref 0.61–1.24)
GFR, Estimated: >60 mL/min (ref >60)
Glucose, Bld: 184 mg/dL — ABNORMAL HIGH (ref 70–99)
Potassium: 4 mmol/L (ref 3.5–5.1)
Sodium: 141 mmol/L (ref 135–145)
Total Bilirubin: 0.5 mg/dL (ref 0.3–1.2)
Total Protein: 10.4 g/dL — ABNORMAL HIGH (ref 6.5–8.1)

## 2021-01-13 LAB — CBC
HCT: 43 % (ref 39.0–52.0)
Hemoglobin: 14.8 g/dL (ref 13.0–17.0)
MCH: 31.1 pg (ref 26.0–34.0)
MCHC: 34.4 g/dL (ref 30.0–36.0)
MCV: 90.3 fL (ref 80.0–100.0)
Platelets: 287 10*3/uL (ref 150–400)
RBC: 4.76 MIL/uL (ref 4.22–5.81)
RDW: 13.2 % (ref 11.5–15.5)
WBC: 13.5 10*3/uL — ABNORMAL HIGH (ref 4.0–10.5)
nRBC: 0 % (ref 0.0–0.2)

## 2021-01-13 LAB — LIPASE, BLOOD: Lipase: 23 U/L (ref 11–51)

## 2021-01-13 MED ORDER — ONDANSETRON HCL 4 MG/2ML IJ SOLN
4.0000 mg | Freq: Once | INTRAMUSCULAR | Status: AC
  Administered 2021-01-13: 4 mg via INTRAVENOUS
  Filled 2021-01-13: qty 2

## 2021-01-13 MED ORDER — ONDANSETRON 4 MG PO TBDP: ORAL_TABLET | ORAL | 0 refills | Status: DC

## 2021-01-13 MED ORDER — PANTOPRAZOLE SODIUM 40 MG IV SOLR
40.0000 mg | Freq: Once | INTRAVENOUS | Status: AC
  Administered 2021-01-13: 40 mg via INTRAVENOUS
  Filled 2021-01-13: qty 40

## 2021-01-13 MED ORDER — HYDROMORPHONE HCL 1 MG/ML IJ SOLN
1.0000 mg | Freq: Once | INTRAMUSCULAR | Status: AC
  Administered 2021-01-13: 1 mg via INTRAVENOUS
  Filled 2021-01-13: qty 1

## 2021-01-13 MED ORDER — SODIUM CHLORIDE 0.9 % IV BOLUS
2000.0000 mL | Freq: Once | INTRAVENOUS | Status: AC
  Administered 2021-01-13: 2000 mL via INTRAVENOUS

## 2021-01-13 NOTE — ED Provider Notes (Signed)
Patient seen after prior ED provider - Dr. Roderic Palau.  Patient has completed 2 L IV fluids.  Patient reports that he feels significantly better.  He now desires discharge home.  Patient declines additional observation and/or additional IV fluids while in the ED.  Patient is understanding need for close follow-up.  Strict return precautions given and understood.  Importance of close follow-up is repeatedly stressed.    Valarie Merino, MD 01/13/21 [phone]

## 2021-01-13 NOTE — ED Triage Notes (Signed)
Patient presents with a complaint of dehydration. He reports muscle cramping, nausea and vomiting . He states he "can't even keep ice down."

## 2021-01-13 NOTE — Discharge Instructions (Addendum)
Follow-up with your family doctor if not improving.  Return for any problem.

## 2021-01-13 NOTE — ED Notes (Signed)
Patient was made aware we need a urine sample. He said he is not able to go at this time. He said he needs some IV fluids and then maybe he can go.

## 2021-01-16 ENCOUNTER — Encounter (HOSPITAL_COMMUNITY): Payer: Self-pay

## 2021-01-16 ENCOUNTER — Emergency Department (HOSPITAL_COMMUNITY)
Admission: EM | Admit: 2021-01-16 | Discharge: 2021-01-16 | Disposition: A | Discharge status: Home / Self Care | Payer: Self-pay | Attending: Emergency Medicine | Admitting: Emergency Medicine

## 2021-01-16 ENCOUNTER — Other Ambulatory Visit: Payer: Self-pay

## 2021-01-16 DIAGNOSIS — E860 Dehydration: Secondary | ICD-10-CM | POA: Insufficient documentation

## 2021-01-16 DIAGNOSIS — K2900 Acute gastritis without bleeding: Secondary | ICD-10-CM | POA: Insufficient documentation

## 2021-01-16 DIAGNOSIS — R112 Nausea with vomiting, unspecified: Secondary | ICD-10-CM

## 2021-01-16 DIAGNOSIS — R197 Diarrhea, unspecified: Secondary | ICD-10-CM | POA: Insufficient documentation

## 2021-01-16 DIAGNOSIS — E119 Type 2 diabetes mellitus without complications: Secondary | ICD-10-CM | POA: Insufficient documentation

## 2021-01-16 LAB — CBC WITH DIFFERENTIAL/PLATELET
Abs Immature Granulocytes: 0.02 10*3/uL (ref 0.00–0.07)
Basophils Absolute: 0 10*3/uL (ref 0.0–0.1)
Basophils Relative: 0 %
Eosinophils Absolute: 0 10*3/uL (ref 0.0–0.5)
Eosinophils Relative: 0 %
HCT: 44.4 % (ref 39.0–52.0)
Hemoglobin: 15 g/dL (ref 13.0–17.0)
Immature Granulocytes: 0 %
Lymphocytes Relative: 28 %
Lymphs Abs: 2.8 10*3/uL (ref 0.7–4.0)
MCH: 30.5 pg (ref 26.0–34.0)
MCHC: 33.8 g/dL (ref 30.0–36.0)
MCV: 90.2 fL (ref 80.0–100.0)
Monocytes Absolute: 0.9 10*3/uL (ref 0.1–1.0)
Monocytes Relative: 9 %
Neutro Abs: 6.4 10*3/uL (ref 1.7–7.7)
Neutrophils Relative %: 63 %
Platelets: 277 10*3/uL (ref 150–400)
RBC: 4.92 MIL/uL (ref 4.22–5.81)
RDW: 12.8 % (ref 11.5–15.5)
WBC: 10.1 10*3/uL (ref 4.0–10.5)
nRBC: 0 % (ref 0.0–0.2)

## 2021-01-16 LAB — COMPREHENSIVE METABOLIC PANEL
ALT: 21 U/L (ref 0–44)
AST: 26 U/L (ref 15–41)
Albumin: 4.8 g/dL (ref 3.5–5.0)
Alkaline Phosphatase: 70 U/L (ref 38–126)
Anion gap: 10 (ref 5–15)
BUN: 35 mg/dL — ABNORMAL HIGH (ref 6–20)
CO2: 26 mmol/L (ref 22–32)
Calcium: 9.6 mg/dL (ref 8.9–10.3)
Chloride: 101 mmol/L (ref 98–111)
Creatinine, Ser: 1.02 mg/dL (ref 0.61–1.24)
GFR, Estimated: >60 mL/min (ref >60)
Glucose, Bld: 110 mg/dL — ABNORMAL HIGH (ref 70–99)
Potassium: 3.5 mmol/L (ref 3.5–5.1)
Sodium: 137 mmol/L (ref 135–145)
Total Bilirubin: 0.9 mg/dL (ref 0.3–1.2)
Total Protein: 9.4 g/dL — ABNORMAL HIGH (ref 6.5–8.1)

## 2021-01-16 LAB — LIPASE, BLOOD: Lipase: 45 U/L (ref 11–51)

## 2021-01-16 MED ORDER — SODIUM CHLORIDE 0.9 % IV BOLUS (SEPSIS)
1000.0000 mL | Freq: Once | INTRAVENOUS | Status: AC
  Administered 2021-01-16: 1000 mL via INTRAVENOUS

## 2021-01-16 MED ORDER — MORPHINE SULFATE (PF) 4 MG/ML IV SOLN
2.0000 mg | Freq: Once | INTRAVENOUS | Status: AC
  Administered 2021-01-16: 2 mg via INTRAVENOUS
  Filled 2021-01-16: qty 1

## 2021-01-16 MED ORDER — ONDANSETRON HCL 4 MG/2ML IJ SOLN
4.0000 mg | Freq: Once | INTRAMUSCULAR | Status: AC
  Administered 2021-01-16: 4 mg via INTRAVENOUS
  Filled 2021-01-16: qty 2

## 2021-01-16 MED ORDER — PANTOPRAZOLE SODIUM 40 MG IV SOLR
40.0000 mg | Freq: Once | INTRAVENOUS | Status: AC
  Administered 2021-01-16: 40 mg via INTRAVENOUS
  Filled 2021-01-16: qty 40

## 2021-01-16 MED ORDER — HYDROMORPHONE HCL 1 MG/ML IJ SOLN
0.5000 mg | Freq: Once | INTRAMUSCULAR | Status: AC
  Administered 2021-01-16: 0.5 mg via INTRAVENOUS
  Filled 2021-01-16: qty 1

## 2021-01-16 MED ORDER — SODIUM CHLORIDE 0.9 % IV SOLN
1000.0000 mL | INTRAVENOUS | Status: DC
  Administered 2021-01-16: 1000 mL via INTRAVENOUS

## 2021-01-16 MED ORDER — PANTOPRAZOLE SODIUM 20 MG PO TBEC: 20.0000 mg | DELAYED_RELEASE_TABLET | Freq: Every day | ORAL | 0 refills | Status: DC

## 2021-01-16 NOTE — ED Provider Notes (Signed)
[facility] DEPT Provider Note   CSN: [id number] Arrival date & time: 01/16/21  0319     History Chief Complaint  Patient presents with  . Emesis    Lee Greer is a 55 y.o. male.  The history is provided by the patient.  Emesis Severity:  Severe Timing:  Intermittent Progression:  Worsening Chronicity:  Recurrent Relieved by:  Nothing Worsened by:  Nothing Associated symptoms: abdominal pain   Associated symptoms: no cough and no fever   Patient with history of diabetes, gastric ulcer, cannabis hyperemesis syndrome presents with vomiting.  Patient reports approximately 2 days ago he began having episodes of vomiting and diarrhea.  He was seen in the emergency department and given fluids and improved.  He reports after going home the vomiting returned he was unable to keep anything down except ice chips.  The vomit and stool are nonbloody.  He also reports abdominal discomfort He reports he is cutting back on marijuana usage. He reports the diarrhea is improved.  No sick contacts     Past Medical History:  Diagnosis Date  . Acute kidney injury ([place]) 04/13/2017   related to "dehydration" (04/14/2017)  . Cannabinoid hyperemesis syndrome   . Diabetes mellitus without complication ([place])   . Esophageal tear   . Gastric ulcer   . Gastritis with intestinal metaplasia of stomach   . Gastroparesis   . H. pylori infection   . Hypokalemia   . Malnutrition of moderate degree (Gomez: 60% to less than 75% of standard weight) ([place])   . Posthemorrhagic anemia   . Upper GI bleed     Patient Active Problem List   Diagnosis Date Noted  . Upper GI bleed 10/04/2018  . Acute GI bleeding   . Anemia, posthemorrhagic, acute   . Acute gastric ulcer with hemorrhage   . Acute gastritis without hemorrhage   . UGI bleed 10/03/2018  . Diabetes mellitus without complication ([place]) 93/71/6967  . Hypokalemia 10/03/2018  . Malnutrition of moderate degree  09/28/2018  . Hematemesis 09/26/2018  . UGIB (upper gastrointestinal bleed) 09/26/2018  . AKI (acute kidney injury) ([place]) 04/13/2017  . Dehydration 04/13/2017  . Nausea with vomiting 02/17/2015    Past Surgical History:  Procedure Laterality Date  . BIOPSY  10/04/2018   Procedure: BIOPSY;  Surgeon: Jerene Bears, MD;  Location: [facility] ENDOSCOPY;  Service: Endoscopy;;  . ESOPHAGOGASTRODUODENOSCOPY (EGD) WITH PROPOFOL N/A 09/27/2018   Procedure: ESOPHAGOGASTRODUODENOSCOPY (EGD) WITH PROPOFOL;  Surgeon: Juanita Craver, MD;  Location: [facility] ENDOSCOPY;  Service: Endoscopy;  Laterality: N/A;  . ESOPHAGOGASTRODUODENOSCOPY (EGD) WITH PROPOFOL N/A 10/04/2018   Procedure: ESOPHAGOGASTRODUODENOSCOPY (EGD) WITH PROPOFOL;  Surgeon: Jerene Bears, MD;  Location: [facility] ENDOSCOPY;  Service: Endoscopy;  Laterality: N/A;  . HOT HEMOSTASIS N/A 10/04/2018   Procedure: HOT HEMOSTASIS (ARGON PLASMA COAGULATION/BICAP);  Surgeon: Jerene Bears, MD;  Location: [facility];  Service: Endoscopy;  Laterality: N/A;       Family History  Problem Relation Age of Onset  . CAD Mother   . Diabetes Neg Hx   . Stroke Neg Hx   . Colon cancer Neg Hx   . Esophageal cancer Neg Hx   . Ulcerative colitis Neg Hx   . Stomach cancer Neg Hx     Social History   Tobacco Use  . Smoking status: Never Smoker  . Smokeless tobacco: Never Used  Vaping Use  . Vaping Use: Never used  Substance Use Topics  . Alcohol use: Not Currently  Comment: 04/14/2017 "sober since 03/2008"  . Drug use: Not Currently    Types: Marijuana    Comment: last used 1 month ago    Home Medications Prior to Admission medications   Medication Sig Start Date End Date Taking? Authorizing Provider  acetaminophen (TYLENOL) 500 MG tablet Take 500 mg by mouth every 6 (six) hours as needed for moderate pain or headache.   Yes [provider]  ondansetron (ZOFRAN ODT) 4 MG disintegrating tablet 4mg  ODT q4 hours prn nausea/vomit Patient taking differently:  Take 4 mg by mouth every 4 (four) hours as needed for nausea or vomiting. 01/13/21  Yes Milton Ferguson, MD    Allergies    Patient has no known allergies.  Review of Systems   Review of Systems  Constitutional: Negative for fever.  Respiratory: Negative for cough.   Cardiovascular: Negative for chest pain.  Gastrointestinal: Positive for abdominal pain and vomiting. Negative for blood in stool.  All other systems reviewed and are negative.   Physical Exam Updated Vital Signs BP (!) 140/109   Pulse 74   Temp 98 F (36.7 C)   Resp 13   Ht 1.854 m (6\' 1" )   Wt 70 kg   SpO2 100%   BMI 20.36 kg/m   Physical Exam CONSTITUTIONAL: Well developed/well nourished HEAD: Normocephalic/atraumatic EYES: EOMI/PERRL, no icterus ENMT: Mucous membranes dry NECK: supple no meningeal signs SPINE/BACK:entire spine nontender CV: S1/S2 noted, no murmurs/rubs/gallops noted LUNGS: Lungs are clear to auscultation bilaterally, no apparent distress ABDOMEN: soft, mild diffuse tenderness, no rebound or guarding, bowel sounds noted throughout abdomen GU:no cva tenderness NEURO: Pt is awake/alert/appropriate, moves all extremitiesx4.  No facial droop.   EXTREMITIES: pulses normal/equal, full ROM SKIN: warm, color normal PSYCH: no abnormalities of mood noted, alert and oriented to situation  ED Results / Procedures / Treatments   Labs (all labs ordered are listed, but only abnormal results are displayed) Labs Reviewed  COMPREHENSIVE METABOLIC PANEL - Abnormal; Notable for the following components:      Result Value   Glucose, Bld 110 (*)    BUN 35 (*)    Total Protein 9.4 (*)    All other components within normal limits  CBC WITH DIFFERENTIAL/PLATELET  LIPASE, BLOOD    EKG EKG Interpretation  Date/Time:  Friday Jan 16 2021 05:11:01 EDT Ventricular Rate:  77 PR Interval:  131 QRS Duration: 81 QT Interval:  411 QTC Calculation: 466 R Axis:   81 Text Interpretation: Sinus rhythm  Biatrial enlargement RSR' in V1 or V2, probably normal variant Nonspecific T abnrm, anterolateral leads Confirmed by Ripley Fraise [phone]) on 01/16/2021 5:32:29 AM   Radiology No results found.  Procedures Procedures   Medications Ordered in ED Medications  sodium chloride 0.9 % bolus 1,000 mL (0 mLs Intravenous Stopped 01/16/21 0649)    Followed by  sodium chloride 0.9 % bolus 1,000 mL (0 mLs Intravenous Stopped 01/16/21 0650)    Followed by  0.9 %  sodium chloride infusion (1,000 mLs Intravenous New Bag/Given 01/16/21 0650)  ondansetron (ZOFRAN) injection 4 mg (4 mg Intravenous Given 01/16/21 0516)  morphine 4 MG/ML injection 2 mg (2 mg Intravenous Given 01/16/21 0516)  HYDROmorphone (DILAUDID) injection 0.5 mg (0.5 mg Intravenous Given 01/16/21 0602)  ondansetron (ZOFRAN) injection 4 mg (4 mg Intravenous Given 01/16/21 0602)  pantoprazole (PROTONIX) injection 40 mg (40 mg Intravenous Given 01/16/21 0601)    ED Course  I have reviewed the triage vital signs and the nursing notes.  Pertinent  labs  results that were available during my care of the patient were reviewed by me and considered in my medical decision making (see chart for details).    MDM Rules/Calculators/A&P                          5:52 AM Patient here for repeat ER visit after being seen for vomiting on May 10.  He reports he is unable to keep down any fluids at home.  He had already received medications prior to my evaluation he is feeling improved.  Labs are pending at this time.  He reports he only got true pain relief last time when given Dilaudid.  Advised that this can cause rebound pain, but he is still requesting 7:06 AM Patient now feels improved.  Vital signs improved.  Labs reveal dehydration but no other acute findings.  Patient feels well for discharge and is requesting ice chips. We will have him restart a PPI and refer back to gastroenterology as he has had gastritis previously. Low suspicion for other  acute abdominal emergency Final Clinical Impression(s) / ED Diagnoses Final diagnoses:  Intractable vomiting with nausea, unspecified vomiting type  Dehydration  Non-intractable vomiting with nausea, unspecified vomiting type  Acute gastritis without hemorrhage, unspecified gastritis type    Rx / DC Orders ED Discharge Orders         Ordered    pantoprazole (PROTONIX) 20 MG tablet  Daily        01/16/21 0705           Ripley Fraise, MD 01/16/21 [phone]

## 2021-01-16 NOTE — ED Notes (Signed)
Patient reports till feeling nausea and pain. Medication given. Warm blankets provided.

## 2021-01-16 NOTE — ED Triage Notes (Signed)
Pt to ED by POV from home with c/o NV and dehydration. Pt was seen here 2 days ago for the same. Reports no relief from prescribed zofran. Arrives A+O, VSS, NADN.

## 2021-01-16 NOTE — ED Provider Notes (Signed)
Emergency Medicine Provider Triage Evaluation Note  Lee Greer , a 55 y.o. male  was evaluated in triage.  Pt complains of persistent nausea and vomiting since 5/10.  Has taken zofran without relief.  No hematemesis, fever, chills.  Generalized abd pain from vomiting.  Denies GI or GU complaints.   Review of Systems  Positive: Nausea, vomiting, abdominal pain Negative: Fever, chills, dysuria.  Physical Exam  BP (!) 144/107   Pulse 89   Temp 98 F (36.7 C)   Resp 18   Ht 6\' 1"  (1.854 m)   Wt 70 kg   SpO2 99%   BMI 20.36 kg/m    Gen:   Awake, uncomfortable, laying on the floor of the triage room Resp:  Normal effort MSK:   Moves extremities without difficulty  Other:  abd soft and nonfocally tender  Medical Decision Making  Medically screening exam initiated at 4:32 AM.  Appropriate orders placed.  [name] was informed that the remainder of the evaluation will be completed by another provider, this initial triage assessment does not replace that evaluation, and the importance of remaining in the ED until their evaluation is complete.  Intractable vomiting with nausea, unspecified vomiting type    Agapito Games 01/16/21 8453    Ripley Fraise, MD 01/17/21 [phone]

## 2021-01-25 NOTE — ED Provider Notes (Signed)
[facility] DEPT Provider Note   CSN: [id number] Arrival date & time: 01/13/21  1133     History Chief Complaint  Patient presents with  . Abdominal Pain  . Nausea  . Emesis    Lee Greer is a 56 y.o. male.  Patient complains of some nausea and vomiting.  Patient feels dehydrated.  Patient has no fevers or cough  The history is provided by the patient and medical records. No language interpreter was used.  Emesis Severity:  Mild Timing:  Intermittent Quality:  Bilious material Able to tolerate:  Liquids Progression:  Unchanged Chronicity:  New Recent urination:  Normal Context: not post-tussive   Relieved by:  Nothing Worsened by:  Nothing Ineffective treatments:  None tried Associated symptoms: no abdominal pain, no cough, no diarrhea and no headaches        Past Medical History:  Diagnosis Date  . Acute kidney injury ([place]) 04/13/2017   related to "dehydration" (04/14/2017)  . Cannabinoid hyperemesis syndrome   . Diabetes mellitus without complication ([place])   . Esophageal tear   . Gastric ulcer   . Gastritis with intestinal metaplasia of stomach   . Gastroparesis   . H. pylori infection   . Hypokalemia   . Malnutrition of moderate degree (Gomez: 60% to less than 75% of standard weight) ([place])   . Posthemorrhagic anemia   . Upper GI bleed     Patient Active Problem List   Diagnosis Date Noted  . Upper GI bleed 10/04/2018  . Acute GI bleeding   . Anemia, posthemorrhagic, acute   . Acute gastric ulcer with hemorrhage   . Acute gastritis without hemorrhage   . UGI bleed 10/03/2018  . Diabetes mellitus without complication ([place]) 20/25/4270  . Hypokalemia 10/03/2018  . Malnutrition of moderate degree 09/28/2018  . Hematemesis 09/26/2018  . UGIB (upper gastrointestinal bleed) 09/26/2018  . AKI (acute kidney injury) ([place]) 04/13/2017  . Dehydration 04/13/2017  . Nausea with vomiting 02/17/2015    Past Surgical History:   Procedure Laterality Date  . BIOPSY  10/04/2018   Procedure: BIOPSY;  Surgeon: Jerene Bears, MD;  Location: [facility] ENDOSCOPY;  Service: Endoscopy;;  . ESOPHAGOGASTRODUODENOSCOPY (EGD) WITH PROPOFOL N/A 09/27/2018   Procedure: ESOPHAGOGASTRODUODENOSCOPY (EGD) WITH PROPOFOL;  Surgeon: Juanita Craver, MD;  Location: [facility] ENDOSCOPY;  Service: Endoscopy;  Laterality: N/A;  . ESOPHAGOGASTRODUODENOSCOPY (EGD) WITH PROPOFOL N/A 10/04/2018   Procedure: ESOPHAGOGASTRODUODENOSCOPY (EGD) WITH PROPOFOL;  Surgeon: Jerene Bears, MD;  Location: [facility] ENDOSCOPY;  Service: Endoscopy;  Laterality: N/A;  . HOT HEMOSTASIS N/A 10/04/2018   Procedure: HOT HEMOSTASIS (ARGON PLASMA COAGULATION/BICAP);  Surgeon: Jerene Bears, MD;  Location: [facility] ENDOS[facility];  Service: Endoscopy;  Laterality: N/A;       Family History  Problem Relation Age of Onset  . CAD Mother   . Diabetes Neg Hx   . Stroke Neg Hx   . Colon cancer Neg Hx   . Esophageal cancer Neg Hx   . Ulcerative colitis Neg Hx   . Stomach cancer Neg Hx     Social History   Tobacco Use  . Smoking status: Never Smoker  . Smokeless tobacco: Never Used  Vaping Use  . Vaping Use: Never used  Substance Use Topics  . Alcohol use: Not Currently    Comment: 04/14/2017 "sober since 03/2008"  . Drug use: Not Currently    Types: Marijuana    Comment: last used 1 month ago    Home Medications Prior to Admission  medications   Medication Sig Start Date End Date Taking? Authorizing Provider  ondansetron (ZOFRAN ODT) 4 MG disintegrating tablet 4mg  ODT q4 hours prn nausea/vomit Patient taking differently: Take 4 mg by mouth every 4 (four) hours as needed for nausea or vomiting. 01/13/21  Yes Milton Ferguson, MD  acetaminophen (TYLENOL) 500 MG tablet Take 500 mg by mouth every 6 (six) hours as needed for moderate pain or headache.    [provider]  pantoprazole (PROTONIX) 20 MG tablet Take 1 tablet (20 mg total) by mouth daily. 01/16/21   Ripley Fraise, MD     Allergies    Patient has no known allergies.  Review of Systems   Review of Systems  Constitutional: Negative for appetite change and fatigue.  HENT: Negative for congestion, ear discharge and sinus pressure.   Eyes: Negative for discharge.  Respiratory: Negative for cough.   Cardiovascular: Negative for chest pain.  Gastrointestinal: Positive for vomiting. Negative for abdominal pain and diarrhea.  Genitourinary: Negative for frequency and hematuria.  Musculoskeletal: Negative for back pain.  Skin: Negative for rash.  Neurological: Negative for seizures and headaches.  Psychiatric/Behavioral: Negative for hallucinations.    Physical Exam Updated Vital Signs BP 136/83 (BP Location: Left Arm)   Pulse 70   Temp (!) 97.5 F (36.4 C) (Oral)   Resp 16   Ht 6\' 1"  (1.854 m)   Wt 70.3 kg   SpO2 100%   BMI 20.45 kg/m   Physical Exam Vitals and nursing note reviewed.  Constitutional:      Appearance: He is well-developed.  HENT:     Head: Normocephalic.     Nose: Nose normal.  Eyes:     General: No scleral icterus.    Conjunctiva/sclera: Conjunctivae normal.  Neck:     Thyroid: No thyromegaly.  Cardiovascular:     Rate and Rhythm: Normal rate and regular rhythm.     Heart sounds: No murmur heard. No friction rub. No gallop.   Pulmonary:     Breath sounds: No stridor. No wheezing or rales.  Chest:     Chest wall: No tenderness.  Abdominal:     General: There is no distension.     Tenderness: There is no abdominal tenderness. There is no rebound.  Musculoskeletal:        General: Normal range of motion.     Cervical back: Neck supple.  Lymphadenopathy:     Cervical: No cervical adenopathy.  Skin:    Findings: No erythema or rash.  Neurological:     Mental Status: He is alert and oriented to person, place, and time.     Motor: No abnormal muscle tone.     Coordination: Coordination normal.  Psychiatric:        Behavior: Behavior normal.     ED Results /  Procedures / Treatments   Labs (all labs ordered are listed, but only abnormal results are displayed) Labs Reviewed  COMPREHENSIVE METABOLIC PANEL - Abnormal; Notable for the following components:      Result Value   Glucose, Bld 184 (*)    BUN 28 (*)    Calcium 10.5 (*)    Total Protein 10.4 (*)    Albumin 5.8 (*)    All other components within normal limits  CBC - Abnormal; Notable for the following components:   WBC 13.5 (*)    All other components within normal limits  LIPASE, BLOOD    EKG None  Radiology No results found.  Procedures Procedures  Medications Ordered in ED Medications  HYDROmorphone (DILAUDID) injection 1 mg (1 mg Intravenous Given 01/13/21 1314)  ondansetron (ZOFRAN) injection 4 mg (4 mg Intravenous Given 01/13/21 1314)  sodium chloride 0.9 % bolus 2,000 mL (0 mLs Intravenous Stopped 01/13/21 1729)  pantoprazole (PROTONIX) injection 40 mg (40 mg Intravenous Given 01/13/21 1314)    ED Course  I have reviewed the triage vital signs and the nursing notes.  Pertinent labs & imaging results that were available during my care of the patient were reviewed by me and considered in my medical decision making (see chart for details).    MDM Rules/Calculators/A&P                          Patient with nausea vomiting and dehydration.  Patient improved with fluids and Zofran.  He will follow-up with his PCP Final Clinical Impression(s) / ED Diagnoses Final diagnoses:  Nausea  Dehydration    Rx / DC Orders ED Discharge Orders         Ordered    ondansetron (ZOFRAN ODT) 4 MG disintegrating tablet        01/13/21 1536           Milton Ferguson, MD 01/25/21 1135

## 2021-03-27 ENCOUNTER — Emergency Department (HOSPITAL_COMMUNITY)
Admission: EM | Admit: 2021-03-27 | Discharge: 2021-03-28 | Disposition: A | Discharge status: Home / Self Care | Payer: Self-pay | Attending: Emergency Medicine | Admitting: Emergency Medicine

## 2021-03-27 ENCOUNTER — Encounter (HOSPITAL_COMMUNITY): Payer: Self-pay

## 2021-03-27 ENCOUNTER — Other Ambulatory Visit: Payer: Self-pay

## 2021-03-27 DIAGNOSIS — E119 Type 2 diabetes mellitus without complications: Secondary | ICD-10-CM | POA: Insufficient documentation

## 2021-03-27 DIAGNOSIS — R112 Nausea with vomiting, unspecified: Secondary | ICD-10-CM | POA: Insufficient documentation

## 2021-03-27 DIAGNOSIS — R1013 Epigastric pain: Secondary | ICD-10-CM | POA: Insufficient documentation

## 2021-03-27 LAB — COMPREHENSIVE METABOLIC PANEL
ALT: 18 U/L (ref 0–44)
AST: 20 U/L (ref 15–41)
Albumin: 5.6 g/dL — ABNORMAL HIGH (ref 3.5–5.0)
Alkaline Phosphatase: 70 U/L (ref 38–126)
Anion gap: 15 (ref 5–15)
BUN: 23 mg/dL — ABNORMAL HIGH (ref 6–20)
CO2: 22 mmol/L (ref 22–32)
Calcium: 10.4 mg/dL — ABNORMAL HIGH (ref 8.9–10.3)
Chloride: 105 mmol/L (ref 98–111)
Creatinine, Ser: 0.82 mg/dL (ref 0.61–1.24)
GFR, Estimated: >60 mL/min (ref >60)
Glucose, Bld: 157 mg/dL — ABNORMAL HIGH (ref 70–99)
Potassium: 4.2 mmol/L (ref 3.5–5.1)
Sodium: 142 mmol/L (ref 135–145)
Total Bilirubin: 0.6 mg/dL (ref 0.3–1.2)
Total Protein: 9.5 g/dL — ABNORMAL HIGH (ref 6.5–8.1)

## 2021-03-27 LAB — CBC
HCT: 44 % (ref 39.0–52.0)
Hemoglobin: 15 g/dL (ref 13.0–17.0)
MCH: 30.5 pg (ref 26.0–34.0)
MCHC: 34.1 g/dL (ref 30.0–36.0)
MCV: 89.6 fL (ref 80.0–100.0)
Platelets: 306 10*3/uL (ref 150–400)
RBC: 4.91 MIL/uL (ref 4.22–5.81)
RDW: 13.2 % (ref 11.5–15.5)
WBC: 11 10*3/uL — ABNORMAL HIGH (ref 4.0–10.5)
nRBC: 0 % (ref 0.0–0.2)

## 2021-03-27 LAB — LIPASE, BLOOD: Lipase: 20 U/L (ref 11–51)

## 2021-03-27 MED ORDER — ONDANSETRON 4 MG PO TBDP
4.0000 mg | ORAL_TABLET | Freq: Once | ORAL | Status: AC | PRN
  Administered 2021-03-27: 4 mg via ORAL
  Filled 2021-03-27: qty 1

## 2021-03-27 MED ORDER — ONDANSETRON HCL 4 MG/2ML IJ SOLN
4.0000 mg | Freq: Once | INTRAMUSCULAR | Status: AC
  Administered 2021-03-27: 4 mg via INTRAVENOUS
  Filled 2021-03-27: qty 2

## 2021-03-27 MED ORDER — PANTOPRAZOLE SODIUM 40 MG IV SOLR
40.0000 mg | Freq: Once | INTRAVENOUS | Status: AC
  Administered 2021-03-27: 40 mg via INTRAVENOUS
  Filled 2021-03-27: qty 40

## 2021-03-27 MED ORDER — SODIUM CHLORIDE 0.9 % IV BOLUS
1000.0000 mL | Freq: Once | INTRAVENOUS | Status: AC
  Administered 2021-03-27: 1000 mL via INTRAVENOUS

## 2021-03-27 MED ORDER — MORPHINE SULFATE (PF) 4 MG/ML IV SOLN
4.0000 mg | Freq: Once | INTRAVENOUS | Status: AC
  Administered 2021-03-27: 4 mg via INTRAVENOUS
  Filled 2021-03-27: qty 1

## 2021-03-27 NOTE — ED Triage Notes (Signed)
Patient c/o mid abdominal pain and emesis since yesterday.

## 2021-03-27 NOTE — ED Provider Notes (Signed)
Emergency Medicine Provider Triage Evaluation Note  Lee Greer , a 55 y.o. male  was evaluated in triage.  Pt complains of abd pain, diffuse worse to epigastric. Has been taking Goodie powders. No blood in emesis. Persistent dry heaving. No diarrhea. Hx of ulcers.   Review of Systems  Positive: Abd pain, emesis, diarrhea, weakness Negative: Fever, cough, dysuria  Physical Exam  BP (!) 173/107 (BP Location: Right Arm)   Pulse 74   Temp 98.3 F (36.8 C) (Oral)   Resp 16   Ht '6\' 1"'$  (1.854 m)   Wt 68 kg   SpO2 100%   BMI 19.79 kg/m  Gen:   Awake, no distress   Resp:  Normal effort  MSK:   Moves extremities without difficulty  Abd:  Soft diffuse tender Other:    Medical Decision Making  Medically screening exam initiated at 12:29 PM.  Appropriate orders placed.  [name] was informed that the remainder of the evaluation will be completed by another provider, this initial triage assessment does not replace that evaluation, and the importance of remaining in the ED until their evaluation is complete.  N/V/D, abd pain   Ayona Yniguez A, PA-C 03/27/21 1230    Regan Lemming, MD 03/28/21 [phone]

## 2021-03-27 NOTE — ED Notes (Signed)
Pt states he is unable to provide a urine until he is provided fluids

## 2021-03-27 NOTE — ED Provider Notes (Signed)
[place] DEPT Provider Note   CSN: [id number] Arrival date & time: 03/27/21  1107     History Chief Complaint  Patient presents with   Abdominal Pain   Emesis    Lee Greer is a 55 y.o. male.  The history is provided by the patient and medical records.  Abdominal Pain Associated symptoms: vomiting   Emesis Associated symptoms: abdominal pain    55 y.o. M with hx of DM, gastric ulcers, gastroparesis, presenting to the ED with nausea/vomiting.  Patient reports he was diagnosed with gastric ulcers on prior EGD in 2020.  He reports he was told to avoid Lee Greer Powder but it had been over a year so he thought it was ok.  He reports he was having some back after sleeping awkwardly.  States he took the [place] powder without thinking and almost immediately afterwards he began vomiting and now is to the point of just dry heaving.  He states he feels weak from not being able to hold down any food or fluids.  He has been taking his home Zofran without much change.  He denies any fevers.  Bowel movements have been normal.  Denies any hematemesis or coffee-ground emesis.  He is still followed by GI, [name].  States he is having a lot of pain from continued retching and feels very dehydrated.  Past Medical History:  Diagnosis Date   Acute kidney injury ([place]) 04/13/2017   related to "dehydration" (04/14/2017)   Cannabinoid hyperemesis syndrome    Diabetes mellitus without complication (HCC)    Esophageal tear    Gastric ulcer    Gastritis with intestinal metaplasia of stomach    Gastroparesis    H. pylori infection    Hypokalemia    Malnutrition of moderate degree (Gomez: 60% to less than 75% of standard weight) (HCC)    Posthemorrhagic anemia    Upper GI bleed     Patient Active Problem List   Diagnosis Date Noted   Upper GI bleed 10/04/2018   Acute GI bleeding    Anemia, posthemorrhagic, acute    Acute gastric ulcer with hemorrhage    Acute  gastritis without hemorrhage    UGI bleed 10/03/2018   Diabetes mellitus without complication ([place]) [id number]   Hypokalemia 10/03/2018   Malnutrition of moderate degree 09/28/2018   Hematemesis 09/26/2018   UGIB (upper gastrointestinal bleed) 09/26/2018   AKI (acute kidney injury) ([place]) 04/13/2017   Dehydration 04/13/2017   Nausea with vomiting 02/17/2015    Past Surgical History:  Procedure Laterality Date   BIOPSY  10/04/2018   Procedure: BIOPSY;  Surgeon: Jerene Bears, MD;  Location: [facility] ENDOSCOPY;  Service: Endoscopy;;   ESOPHAGOGASTRODUODENOSCOPY (EGD) WITH PROPOFOL N/A 09/27/2018   Procedure: ESOPHAGOGASTRODUODENOSCOPY (EGD) WITH PROPOFOL;  Surgeon: Juanita Craver, MD;  Location: [facility] ENDOSCOPY;  Service: Endoscopy;  Laterality: N/A;   ESOPHAGOGASTRODUODENOSCOPY (EGD) WITH PROPOFOL N/A 10/04/2018   Procedure: ESOPHAGOGASTRODUODENOSCOPY (EGD) WITH PROPOFOL;  Surgeon: Jerene Bears, MD;  Location: [facility] ENDOSCOPY;  Service: Endoscopy;  Laterality: N/A;   HOT HEMOSTASIS N/A 10/04/2018   Procedure: HOT HEMOSTASIS (ARGON PLASMA COAGULATION/BICAP);  Surgeon: Jerene Bears, MD;  Location: [facility] ENDOSCOPY;  Service: Endoscopy;  Laterality: N/A;       Family History  Problem Relation Age of Onset   CAD Mother    Diabetes Neg Hx    Stroke Neg Hx    Colon cancer Neg Hx    Esophageal cancer Neg Hx    Ulcerative  colitis Neg Hx    Stomach cancer Neg Hx     Social History   Tobacco Use   Smoking status: Never   Smokeless tobacco: Never  Vaping Use   Vaping Use: Never used  Substance Use Topics   Alcohol use: Not Currently    Comment: 04/14/2017 "sober 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 Authorizing Provider  acetaminophen (TYLENOL) 500 MG tablet Take 500 mg by mouth every 6 (six) hours as needed for moderate pain or headache.     [provider]  ondansetron (ZOFRAN ODT) 4 MG disintegrating tablet '4mg'$  ODT q4 hours prn nausea/vomit Patient taking differently: Take 4 mg by mouth every 4 (four) hours as needed for nausea or vomiting. 01/13/21   Milton Ferguson, MD  pantoprazole (PROTONIX) 20 MG tablet Take 1 tablet (20 mg total) by mouth daily. 01/16/21   Ripley Fraise, MD    Allergies    Patient has no known allergies.  Review of Systems   Review of Systems  Gastrointestinal:  Positive for abdominal pain and vomiting.  All other systems reviewed and are negative.  Physical Exam Updated Vital Signs BP (!) 148/84   Pulse 84   Temp 98.3 F (36.8 C) (Oral)   Resp 18   Ht '6\' 1"'$  (1.854 m)   Wt 68 kg   SpO2 100%   BMI 19.79 kg/m   Physical Exam Vitals and nursing note reviewed.  Constitutional:      Appearance: He is well-developed.  HENT:     Head: Normocephalic and atraumatic.  Eyes:     Conjunctiva/sclera: Conjunctivae normal.     Pupils: Pupils are equal, round, and reactive to light.  Cardiovascular:     Rate and Rhythm: Normal rate and regular rhythm.     Heart sounds: Normal heart sounds.  Pulmonary:     Effort: Pulmonary effort is normal.     Breath sounds: Normal breath sounds.  Abdominal:     General: Bowel sounds are normal.     Palpations: Abdomen is soft.     Tenderness: There is abdominal tenderness (mild) in the epigastric area.  Musculoskeletal:        General: Normal range of motion.     Cervical back: Normal range of motion.  Skin:    General: Skin is warm and dry.  Neurological:     Mental Status: He is alert and oriented to person, place, and time.    ED Results / Procedures / Treatments   Labs (all labs ordered are listed, but only abnormal results are displayed) Labs Reviewed  COMPREHENSIVE METABOLIC PANEL - Abnormal; Notable for the following components:      Result Value   Glucose, Bld 157 (*)    BUN 23 (*)    Calcium 10.4 (*)    Total Protein 9.5 (*)     Albumin 5.6 (*)    All other components within normal limits  CBC - Abnormal; Notable for the following components:   WBC 11.0 (*)    All other components within normal limits  LIPASE, BLOOD  URINALYSIS, ROUTINE W REFLEX MICROSCOPIC    EKG None  Radiology No results found.  Procedures Procedures   Medications Ordered in ED Medications  ondansetron (ZOFRAN-ODT) disintegrating tablet 4 mg (4 mg Oral Given 03/27/21 1222)  pantoprazole (PROTONIX) injection 40 mg (40 mg Intravenous Given 03/27/21 2300)  sodium chloride 0.9 % bolus 1,000 mL (1,000 mLs Intravenous New Bag/Given 03/27/21 2258)  morphine 4 MG/ML injection 4 mg (4 mg Intravenous Given 03/27/21 2300)  ondansetron (ZOFRAN) injection 4 mg (4 mg Intravenous Given 03/27/21 2300)  sodium chloride 0.9 % bolus 1,000 mL (1,000 mLs Intravenous New Bag/Given 03/28/21 0045)    ED Course  I have reviewed the triage vital signs and the nursing notes.  Pertinent labs & imaging results that were available during my care of the patient were reviewed by me and considered in my medical decision making (see chart for details).    MDM Rules/Calculators/A&P                            55 y.o. M here with epigastric pain, nausea, and vomiting.  Hx of gastric ulcers, took a goody powder for the first time in over a year right before this began.  No bloody or coffee ground emesis.  He is afebrile, non-toxic.  Minimal tenderness to epigastrium, states it feels "sore" from retching.  Normal bowel sounds, no distention.  Normal BM's recently.  Doubt obstruction.  Labs are reassuring.  He was hydrated, given PPI, zofran, and small dose of pain medication with vast improvement of symptoms.  No active emesis here in the ED.  He is asking to be discharged which seems appropriate.  Continue home PPI, zofran.  Advised to avoid NSAIDs.  Close follow-up with GI.  Return here for new concerns.  Final Clinical Impression(s) / ED Diagnoses Final diagnoses:   Non-intractable vomiting with nausea, unspecified vomiting type    Rx / DC Orders ED Discharge Orders     None        [name], PA-C 03/28/21 0247    Gareth Morgan, MD 03/28/21 [phone]

## 2021-03-28 MED ORDER — SODIUM CHLORIDE 0.9 % IV BOLUS
1000.0000 mL | Freq: Once | INTRAVENOUS | Status: AC
  Administered 2021-03-28: 1000 mL via INTRAVENOUS

## 2021-03-28 NOTE — Discharge Instructions (Addendum)
Make sure to stay hydrated.  Avoid NSAIDs (motrin, aleve, ibuprofen, Goody's) Follow-up with [name]. Return here for new concerns.

## 2021-07-04 ENCOUNTER — Encounter (HOSPITAL_COMMUNITY): Payer: Self-pay

## 2021-07-04 ENCOUNTER — Other Ambulatory Visit: Payer: Self-pay

## 2021-07-04 ENCOUNTER — Emergency Department (HOSPITAL_COMMUNITY)
Admission: EM | Admit: 2021-07-04 | Discharge: 2021-07-04 | Disposition: A | Discharge status: Home / Self Care | Payer: Self-pay | Attending: Emergency Medicine | Admitting: Emergency Medicine

## 2021-07-04 DIAGNOSIS — R1084 Generalized abdominal pain: Secondary | ICD-10-CM | POA: Insufficient documentation

## 2021-07-04 DIAGNOSIS — R112 Nausea with vomiting, unspecified: Secondary | ICD-10-CM | POA: Insufficient documentation

## 2021-07-04 DIAGNOSIS — E119 Type 2 diabetes mellitus without complications: Secondary | ICD-10-CM | POA: Insufficient documentation

## 2021-07-04 LAB — CBC WITH DIFFERENTIAL/PLATELET
Abs Immature Granulocytes: 0.07 10*3/uL (ref 0.00–0.07)
Basophils Absolute: 0 10*3/uL (ref 0.0–0.1)
Basophils Relative: 0 %
Eosinophils Absolute: 0 10*3/uL (ref 0.0–0.5)
Eosinophils Relative: 0 %
HCT: 45.2 % (ref 39.0–52.0)
Hemoglobin: 15.5 g/dL (ref 13.0–17.0)
Immature Granulocytes: 1 %
Lymphocytes Relative: 21 %
Lymphs Abs: 2.9 10*3/uL (ref 0.7–4.0)
MCH: 31.1 pg (ref 26.0–34.0)
MCHC: 34.3 g/dL (ref 30.0–36.0)
MCV: 90.6 fL (ref 80.0–100.0)
Monocytes Absolute: 1.1 10*3/uL — ABNORMAL HIGH (ref 0.1–1.0)
Monocytes Relative: 8 %
Neutro Abs: 9.5 10*3/uL — ABNORMAL HIGH (ref 1.7–7.7)
Neutrophils Relative %: 70 %
Platelets: 278 10*3/uL (ref 150–400)
RBC: 4.99 MIL/uL (ref 4.22–5.81)
RDW: 13.2 % (ref 11.5–15.5)
WBC: 13.6 10*3/uL — ABNORMAL HIGH (ref 4.0–10.5)
nRBC: 0 % (ref 0.0–0.2)

## 2021-07-04 LAB — COMPREHENSIVE METABOLIC PANEL
ALT: 20 U/L (ref 0–44)
AST: 25 U/L (ref 15–41)
Albumin: 5.5 g/dL — ABNORMAL HIGH (ref 3.5–5.0)
Alkaline Phosphatase: 79 U/L (ref 38–126)
Anion gap: 13 (ref 5–15)
BUN: 31 mg/dL — ABNORMAL HIGH (ref 6–20)
CO2: 23 mmol/L (ref 22–32)
Calcium: 10 mg/dL (ref 8.9–10.3)
Chloride: 102 mmol/L (ref 98–111)
Creatinine, Ser: 1.28 mg/dL — ABNORMAL HIGH (ref 0.61–1.24)
GFR, Estimated: >60 mL/min (ref >60)
Glucose, Bld: 124 mg/dL — ABNORMAL HIGH (ref 70–99)
Potassium: 3.9 mmol/L (ref 3.5–5.1)
Sodium: 138 mmol/L (ref 135–145)
Total Bilirubin: 0.7 mg/dL (ref 0.3–1.2)
Total Protein: 10 g/dL — ABNORMAL HIGH (ref 6.5–8.1)

## 2021-07-04 LAB — MAGNESIUM: Magnesium: 2.5 mg/dL — ABNORMAL HIGH (ref 1.7–2.4)

## 2021-07-04 MED ORDER — SODIUM CHLORIDE 0.9 % IV BOLUS
500.0000 mL | Freq: Once | INTRAVENOUS | Status: AC
  Administered 2021-07-04: 500 mL via INTRAVENOUS

## 2021-07-04 MED ORDER — MORPHINE SULFATE (PF) 4 MG/ML IV SOLN
4.0000 mg | Freq: Once | INTRAVENOUS | Status: AC
Start: 2021-07-04 — End: 2021-07-04
  Administered 2021-07-04: 4 mg via INTRAVENOUS
  Filled 2021-07-04: qty 1

## 2021-07-04 MED ORDER — ONDANSETRON 4 MG PO TBDP
4.0000 mg | ORAL_TABLET | Freq: Once | ORAL | Status: AC
  Administered 2021-07-04: 4 mg via ORAL
  Filled 2021-07-04: qty 1

## 2021-07-04 MED ORDER — FAMOTIDINE IN NACL 20-0.9 MG/50ML-% IV SOLN
20.0000 mg | Freq: Once | INTRAVENOUS | Status: AC
  Administered 2021-07-04: 20 mg via INTRAVENOUS
  Filled 2021-07-04: qty 50

## 2021-07-04 MED ORDER — SODIUM CHLORIDE 0.9 % IV BOLUS
1000.0000 mL | Freq: Once | INTRAVENOUS | Status: AC
  Administered 2021-07-04: 1000 mL via INTRAVENOUS

## 2021-07-04 NOTE — ED Provider Notes (Signed)
Emergency Medicine Provider Triage Evaluation Note  Lee Greer , a 55 y.o. male  was evaluated in triage.  Pt complains of vomiting.  The patient reports approximately 5 episodes of nonbloody, nonbilious vomiting, and countless episodes of dry heaving, onset over the last 24 hours.  He says that the vomiting began suddenly after having a large, solid bowel movement in the toilet.  States that this was a regular bowel movement.  He was not straining.  There was no syncope.  He has not been taking laxatives.  States that he has previously had similar presentation of symptoms for which she had to come to the emergency department to get IV fluids.  He reports generalized abdominal cramping.  He is only voided once in the last 24 hours.  No fever, chills, melena, hematochezia, hematemesis, cough, shortness of breath, chest pain, dysuria, hematuria, or URI symptoms.  He states that he is a non-smoker.  He denies cannabis use, as well as other illicit or recreational substance use.  Denies alcohol use.  No known sick contacts.  Per chart review, previous UDS have been positive for cannabis.  Review of Systems  Positive: Vomiting, abdominal pain, decreased urinary output Negative: Fever, chills, syncope, hematochezia, hematemesis, melena, cough, shortness of breath, chest pain, dysuria, hematuria dysuria, nasal congestion, rhinorrhea, sore throat  Physical Exam  BP (!) 145/109 (BP Location: Left Arm)   Pulse (!) 113   Temp 98.7 F (37.1 C) (Oral)   Resp 16   Ht 6\' 1"  (1.854 m)   Wt 63.5 kg   SpO2 99%   BMI 18.47 kg/m  Gen:   Awake, no distress   Resp:  Normal effort  MSK:   Moves extremities without difficulty  Other:  Abdomen is soft, nontender, nondistended.  Medical Decision Making  Medically screening exam initiated at 2:20 AM.  Appropriate orders placed.  [name] was informed that the remainder of the evaluation will be completed by another provider, this initial triage  assessment does not replace that evaluation, and the importance of remaining in the ED until their evaluation is complete.  Labs and imaging have been ordered.  He will require further work-up and evaluation in the emergency department.   Joanne Gavel, PA-C 07/04/21 0223    Quintella Reichert, MD 07/05/21 [phone]

## 2021-07-04 NOTE — ED Provider Notes (Signed)
[facility] DEPT Provider Note   CSN: [id number] Arrival date & time: 07/04/21  0140     History Chief Complaint  Patient presents with   Emesis    Lee Greer is a 55 y.o. male with a past medical history of gastric ulcers, gastroparesis, diabetes presenting to the ED with a chief complaint of emesis.  Since yesterday has been having several episodes of nonbloody, nonbilious emesis after having a bowel movement.  He reports generalized abdominal pain associated with his vomiting but no pain otherwise.  States that this is happened to him in the past with unknown reason.  He took a dose of Zofran yesterday with only minimal improvement.  No changes to urination.  No sick contacts with similar symptoms.  No chest pain, cough, shortness of breath.  He had a gastric ulcer repair a few years ago but denies any prior abdominal surgeries.  Denies any bloody stools. He is concerned that he is dehydrated.   Emesis Associated symptoms: no abdominal pain, no chills, no cough, no diarrhea, no fever, no myalgias and no sore throat       Past Medical History:  Diagnosis Date   Acute kidney injury ([place]) 04/13/2017   related to "dehydration" (04/14/2017)   Cannabinoid hyperemesis syndrome    Diabetes mellitus without complication ([place])    Esophageal tear    Gastric ulcer    Gastritis with intestinal metaplasia of stomach    Gastroparesis    H. pylori infection    Hypokalemia    Malnutrition of moderate degree (Gomez: 60% to less than 75% of standard weight) (HCC)    Posthemorrhagic anemia    Upper GI bleed     Patient Active Problem List   Diagnosis Date Noted   Upper GI bleed 10/04/2018   Acute GI bleeding    Anemia, posthemorrhagic, acute    Acute gastric ulcer with hemorrhage    Acute gastritis without hemorrhage    UGI bleed 10/03/2018   Diabetes mellitus without complication ([place]) 40/98/1191   Hypokalemia 10/03/2018   Malnutrition of moderate  degree 09/28/2018   Hematemesis 09/26/2018   UGIB (upper gastrointestinal bleed) 09/26/2018   AKI (acute kidney injury) ([place]) 04/13/2017   Dehydration 04/13/2017   Nausea with vomiting 02/17/2015    Past Surgical History:  Procedure Laterality Date   BIOPSY  10/04/2018   Procedure: BIOPSY;  Surgeon: Jerene Bears, MD;  Location: [facility] ENDOSCOPY;  Service: Endoscopy;;   ESOPHAGOGASTRODUODENOSCOPY (EGD) WITH PROPOFOL N/A 09/27/2018   Procedure: ESOPHAGOGASTRODUODENOSCOPY (EGD) WITH PROPOFOL;  Surgeon: Juanita Craver, MD;  Location[facility] ENDOSCOPY;  Service: Endoscopy;  Laterality: N/A;   ESOPHAGOGASTRODUODENOSCOPY (EGD) WITH PROPOFOL N/A 10/04/2018   Procedure: ESOPHAGOGASTRODUODENOSCOPY (EGD) WITH PROPOFOL;  Surgeon: Jerene Bears, MD;  Location: [facility] ENDOSCOPY;  Service: Endoscopy;  Laterality: N/A;   HOT HEMOSTASIS N/A 10/04/2018   Procedure: HOT HEMOSTASIS (ARGON PLASMA COAGULATION/BICAP);  Surgeon: Jerene Bears, MD;  Location: [facility] ENDOSCOPY;  Service: Endoscopy;  Laterality: N/A;       Family History  Problem Relation Age of Onset   CAD Mother    Diabetes Neg Hx    Stroke Neg Hx    Colon cancer Neg Hx    Esophageal cancer Neg Hx    Ulcerative colitis Neg Hx    Stomach cancer Neg Hx     Social History   Tobacco Use   Smoking status: Never   Smokeless tobacco: Never  Vaping Use   Vaping Use: Never used  Substance Use Topics   Alcohol use: Not Currently    Comment: 04/14/2017 "sober 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 Authorizing Provider  acetaminophen (TYLENOL) 500 MG tablet Take 500 mg by mouth every 6 (six) hours as needed for moderate pain or headache.    [provider]  ondansetron (ZOFRAN ODT) 4 MG disintegrating tablet 4mg  ODT q4 hours prn nausea/vomit Patient taking differently: Take 4 mg by mouth every 4  (four) hours as needed for nausea or vomiting. 01/13/21   Milton Ferguson, MD  pantoprazole (PROTONIX) 20 MG tablet Take 1 tablet (20 mg total) by mouth daily. 01/16/21   Ripley Fraise, MD    Allergies    Patient has no known allergies.  Review of Systems   Review of Systems  Constitutional:  Negative for appetite change, chills and fever.  HENT:  Negative for ear pain, rhinorrhea, sneezing and sore throat.   Eyes:  Negative for photophobia and visual disturbance.  Respiratory:  Negative for cough, chest tightness, shortness of breath and wheezing.   Cardiovascular:  Negative for chest pain and palpitations.  Gastrointestinal:  Positive for nausea and vomiting. Negative for abdominal pain, blood in stool, constipation and diarrhea.  Genitourinary:  Negative for dysuria, hematuria and urgency.  Musculoskeletal:  Negative for myalgias.  Skin:  Negative for rash.  Neurological:  Negative for dizziness, weakness and light-headedness.   Physical Exam Updated Vital Signs BP (!) 133/95   Pulse 84   Temp 98.4 F (36.9 C) (Oral)   Resp 16   Ht 6\' 1"  (1.854 m)   Wt 63.5 kg   SpO2 99%   BMI 18.47 kg/m   Physical Exam Vitals and nursing note reviewed.  Constitutional:      General: He is not in acute distress.    Appearance: He is well-developed.  HENT:     Head: Normocephalic and atraumatic.     Nose: Nose normal.  Eyes:     General: No scleral icterus.       Right eye: No discharge.        Left eye: No discharge.     Conjunctiva/sclera: Conjunctivae normal.  Cardiovascular:     Rate and Rhythm: Normal rate and regular rhythm.     Heart sounds: Normal heart sounds. No murmur heard.   No friction rub. No gallop.  Pulmonary:     Effort: Pulmonary effort is normal. No respiratory distress.     Breath sounds: Normal breath sounds.  Abdominal:     General: Bowel sounds are normal. There is no distension.     Palpations: Abdomen is soft.     Tenderness: There is no abdominal  tenderness. There is no guarding.     Comments: Abdomen is soft, nontender nondistended.  Musculoskeletal:        General: Normal range of motion.     Cervical back: Normal range of motion and neck supple.  Skin:    General: Skin is warm and dry.     Findings: No rash.  Neurological:     Mental Status: He is alert.     Motor: No abnormal muscle tone.     Coordination: Coordination normal.    ED Results / Procedures / Treatments   Labs (all labs ordered are listed, but only abnormal results are displayed)  Labs Reviewed  CBC WITH DIFFERENTIAL/PLATELET - Abnormal; Notable for the following components:      Result Value   WBC 13.6 (*)    Neutro Abs 9.5 (*)    Monocytes Absolute 1.1 (*)    All other components within normal limits  COMPREHENSIVE METABOLIC PANEL - Abnormal; Notable for the following components:   Glucose, Bld 124 (*)    BUN 31 (*)    Creatinine, Ser 1.28 (*)    Total Protein 10.0 (*)    Albumin 5.5 (*)    All other components within normal limits  MAGNESIUM - Abnormal; Notable for the following components:   Magnesium 2.5 (*)    All other components within normal limits  URINALYSIS, ROUTINE W REFLEX MICROSCOPIC    EKG None  Radiology No results found.  Procedures Procedures   Medications Ordered in ED Medications  ondansetron (ZOFRAN-ODT) disintegrating tablet 4 mg (4 mg Oral Given 07/04/21 0256)  sodium chloride 0.9 % bolus 1,000 mL (0 mLs Intravenous Stopped 07/04/21 1754)  famotidine (PEPCID) IVPB 20 mg premix (0 mg Intravenous Stopped 07/04/21 1754)  morphine 4 MG/ML injection 4 mg (4 mg Intravenous Given 07/04/21 1604)  sodium chloride 0.9 % bolus 500 mL (0 mLs Intravenous Stopped 07/04/21 1754)    ED Course  I have reviewed the triage vital signs and the nursing notes.  Pertinent labs & imaging results that were available during my care of the patient were reviewed by me and considered in my medical decision making (see chart for  details).  Clinical Course as of 07/04/21 1839  Sat Jul 04, 2021  1527 WBC(!): 13.6 [HK]  1527 Creatinine(!): 1.28 [HK]    Clinical Course User Index [HK] Delia Heady, PA-C   MDM Rules/Calculators/A&P                           55 year old male with a past medical history of gastric ulcers, gastroparesis, diabetes presenting to the ED for emesis.  Symptoms began yesterday.  Chart review shows similar symptoms in the past with improvement with medications given here.  He denies any significant abdominal pain, changes to urination, diarrhea or constipation.  Denies any respiratory symptoms.  He is tachycardic upon arrival here I feel this is likely related to dehydration.  His abdomen is soft, nontender nondistended.  Work-up significant for creatinine of 1.2 which is slightly higher than his baseline.  CBC with leukocytosis of 13.6 which I feel is reactive from his symptoms.  His magnesium level is normal.  Will order IV fluids, medication and reassess.  Patient reports significant improvement with medications given.  He is tolerating p.o. intake without difficulty.  He has received 1.5 L of normal saline which I feel will adequately hydrate him and correct his slight AKI.  Suspect that his symptoms could be viral in nature.  I doubt appendicitis, cholecystitis, pancreatitis or other emergent or surgical cause requiring further work-up or treatment.  He is agreeable to discharge home, continuing antiemetics, slowly advancing his diet and staying hydrated.  Return precautions given.    Patient is hemodynamically stable, in NAD, and able to ambulate in the ED. Evaluation does not show pathology that would require ongoing emergent intervention or inpatient treatment. I explained the diagnosis to the patient. Pain has been managed and has no complaints prior to discharge. Patient is comfortable with above plan and is stable for discharge at this time. All questions were answered prior to disposition.  Strict return precautions for returning to the ED were discussed. Encouraged follow up with PCP.   An After Visit Summary was printed and given to the patient.   Portions of this note were generated with [employer]. Dictation errors may occur despite best attempts at proofreading.  Final Clinical Impression(s) / ED Diagnoses Final diagnoses:  Nausea and vomiting, unspecified vomiting type    Rx / DC Orders ED Discharge Orders     None        Delia Heady, PA-C 07/04/21 1839    Luna Fuse, MD 07/08/21 [phone]

## 2021-07-04 NOTE — Discharge Instructions (Signed)
Continue taking your home nausea medicine as needed. Follow-up with your primary care provider. Return to the ER if you start to experience worsening symptoms, severe abdominal pain, chest pain, shortness of breath. Make sure you are staying hydrated and slowly advance her diet as tolerated without eating any heavy, acidic or fatty foods.

## 2021-07-04 NOTE — ED Triage Notes (Signed)
Pt complains of vomiting since Friday. Pt states that he feels that he is dehydrated.

## 2021-07-06 ENCOUNTER — Emergency Department (HOSPITAL_COMMUNITY)
Admission: EM | Admit: 2021-07-06 | Discharge: 2021-07-06 | Disposition: A | Discharge status: Home / Self Care | Payer: Self-pay | Attending: Emergency Medicine | Admitting: Emergency Medicine

## 2021-07-06 ENCOUNTER — Other Ambulatory Visit: Payer: Self-pay

## 2021-07-06 ENCOUNTER — Encounter (HOSPITAL_COMMUNITY): Payer: Self-pay

## 2021-07-06 DIAGNOSIS — E860 Dehydration: Secondary | ICD-10-CM | POA: Insufficient documentation

## 2021-07-06 DIAGNOSIS — E119 Type 2 diabetes mellitus without complications: Secondary | ICD-10-CM | POA: Insufficient documentation

## 2021-07-06 DIAGNOSIS — R112 Nausea with vomiting, unspecified: Secondary | ICD-10-CM

## 2021-07-06 DIAGNOSIS — R1084 Generalized abdominal pain: Secondary | ICD-10-CM | POA: Insufficient documentation

## 2021-07-06 DIAGNOSIS — R1110 Vomiting, unspecified: Secondary | ICD-10-CM | POA: Insufficient documentation

## 2021-07-06 LAB — COMPREHENSIVE METABOLIC PANEL
ALT: 25 U/L (ref 0–44)
AST: 33 U/L (ref 15–41)
Albumin: 4.8 g/dL (ref 3.5–5.0)
Alkaline Phosphatase: 72 U/L (ref 38–126)
Anion gap: 10 (ref 5–15)
BUN: 30 mg/dL — ABNORMAL HIGH (ref 6–20)
CO2: 22 mmol/L (ref 22–32)
Calcium: 9.1 mg/dL (ref 8.9–10.3)
Chloride: 106 mmol/L (ref 98–111)
Creatinine, Ser: 1.02 mg/dL (ref 0.61–1.24)
GFR, Estimated: >60 mL/min (ref >60)
Glucose, Bld: 100 mg/dL — ABNORMAL HIGH (ref 70–99)
Potassium: 4.2 mmol/L (ref 3.5–5.1)
Sodium: 138 mmol/L (ref 135–145)
Total Bilirubin: 1 mg/dL (ref 0.3–1.2)
Total Protein: 8.6 g/dL — ABNORMAL HIGH (ref 6.5–8.1)

## 2021-07-06 LAB — URINALYSIS, ROUTINE W REFLEX MICROSCOPIC
Bacteria, UA: NONE SEEN
Bilirubin Urine: NEGATIVE
Glucose, UA: NEGATIVE mg/dL
Ketones, ur: 20 mg/dL — AB
Nitrite: NEGATIVE
Protein, ur: NEGATIVE mg/dL
Specific Gravity, Urine: 1.027 (ref 1.005–1.030)
pH: 5 (ref 5.0–8.0)

## 2021-07-06 LAB — CBC
HCT: 42.4 % (ref 39.0–52.0)
Hemoglobin: 14.7 g/dL (ref 13.0–17.0)
MCH: 31.3 pg (ref 26.0–34.0)
MCHC: 34.7 g/dL (ref 30.0–36.0)
MCV: 90.2 fL (ref 80.0–100.0)
Platelets: 269 10*3/uL (ref 150–400)
RBC: 4.7 MIL/uL (ref 4.22–5.81)
RDW: 12.4 % (ref 11.5–15.5)
WBC: 9.7 10*3/uL (ref 4.0–10.5)
nRBC: 0 % (ref 0.0–0.2)

## 2021-07-06 LAB — LIPASE, BLOOD: Lipase: 20 U/L (ref 11–51)

## 2021-07-06 MED ORDER — PANTOPRAZOLE SODIUM 40 MG IV SOLR
40.0000 mg | Freq: Once | INTRAVENOUS | Status: AC
  Administered 2021-07-06: 40 mg via INTRAVENOUS
  Filled 2021-07-06: qty 40

## 2021-07-06 MED ORDER — SODIUM CHLORIDE 0.9 % IV BOLUS
1000.0000 mL | Freq: Once | INTRAVENOUS | Status: AC
  Administered 2021-07-06: 1000 mL via INTRAVENOUS

## 2021-07-06 MED ORDER — HALOPERIDOL LACTATE 5 MG/ML IJ SOLN
5.0000 mg | Freq: Once | INTRAMUSCULAR | Status: AC
  Administered 2021-07-06: 5 mg via INTRAVENOUS
  Filled 2021-07-06: qty 1

## 2021-07-06 MED ORDER — ONDANSETRON 8 MG PO TBDP: 8.0000 mg | ORAL_TABLET | Freq: Three times a day (TID) | ORAL | 0 refills | Status: DC | PRN

## 2021-07-06 NOTE — ED Provider Notes (Signed)
[facility] DEPT Provider Note   CSN: [id number] Arrival date & time: 07/06/21  3500     History Chief Complaint  Patient presents with   Emesis    Lee Greer is a 55 y.o. male.  HPI    55 year old male comes in with chief complaint of vomiting.  He has no significant medical history.  He reports that he started having this current episode of vomiting yesterday.  He threw up about 3-6 times.  Typically the vomiting is provoked by p.o. intake.  His nausea is fairly constant.  He has some generalized abdominal discomfort.  Bowel meds have been normal.  He was seen in the ER recently with similar complaints.  Past medical history significant for PUD.  Pain is not similar to his ulcer pain.  Past Medical History:  Diagnosis Date   Acute kidney injury ([place]) 04/13/2017   related to "dehydration" (04/14/2017)   Cannabinoid hyperemesis syndrome    Diabetes mellitus without complication (HCC)    Esophageal tear    Gastric ulcer    Gastritis with intestinal metaplasia of stomach    Gastroparesis    H. pylori infection    Hypokalemia    Malnutrition of moderate degree ([place]: 60% to less than 75% of standard weight) (HCC)    Posthemorrhagic anemia    Upper GI bleed     Patient Active Problem List   Diagnosis Date Noted   Upper GI bleed 10/04/2018   Acute GI bleeding    Anemia, posthemorrhagic, acute    Acute gastric ulcer with hemorrhage    Acute gastritis without hemorrhage    UGI bleed 10/03/2018   Diabetes mellitus without complication ([place]) 93/81/8299   Hypokalemia 10/03/2018   Malnutrition of moderate degree 09/28/2018   Hematemesis 09/26/2018   UGIB (upper gastrointestinal bleed) 09/26/2018   AKI (acute kidney injury) ([place]) 04/13/2017   Dehydration 04/13/2017   Nausea with vomiting 02/17/2015    Past Surgical History:  Procedure Laterality Date   BIOPSY  10/04/2018   Procedure: BIOPSY;  Surgeon: Jerene Bears, MD;  Location:  [facility] ENDOSCOPY;  Service: Endoscopy;;   ESOPHAGOGASTRODUODENOSCOPY (EGD) WITH PROPOFOL N/A 09/27/2018   Procedure: ESOPHAGOGASTRODUODENOSCOPY (EGD) WITH PROPOFOL;  Surgeon: Juanita Craver, MD;  Location: [facility] ENDOSCOPY;  Service: Endoscopy;  Laterality: N/A;   ESOPHAGOGASTRODUODENOSCOPY (EGD) WITH PROPOFOL N/A 10/04/2018   Procedure: ESOPHAGOGASTRODUODENOSCOPY (EGD) WITH PROPOFOL;  Surgeon: Jerene Bears, MD;  Location: [facility] ENDOSCOPY;  Service: Endoscopy;  Laterality: N/A;   HOT HEMOSTASIS N/A 10/04/2018   Procedure: HOT HEMOSTASIS (ARGON PLASMA COAGULATION/BICAP);  Surgeon: Jerene Bears, MD;  Location: [facility];  Service: Endoscopy;  Laterality: N/A;       Family History  Problem Relation Age of Onset   CAD Mother    Diabetes Neg Hx    Stroke Neg Hx    Colon cancer Neg Hx    Esophageal cancer Neg Hx    Ulcerative colitis Neg Hx    Stomach cancer Neg Hx     Social History   Tobacco Use   Smoking status: Never   Smokeless tobacco: Never  Vaping Use   Vaping Use: Never used  Substance Use Topics   Alcohol use: Not Currently    Comment: 04/14/2017 "sober 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 Authorizing  Provider  ondansetron (ZOFRAN ODT) 8 MG disintegrating tablet Take 1 tablet (8 mg total) by mouth every 8 (eight) hours as needed for nausea. 07/06/21  Yes Varney Biles, MD  acetaminophen (TYLENOL) 500 MG tablet Take 500 mg by mouth every 6 (six) hours as needed for moderate pain or headache.    [provider]  pantoprazole (PROTONIX) 20 MG tablet Take 1 tablet (20 mg total) by mouth daily. 01/16/21   Ripley Fraise, MD    Allergies    Patient has no known allergies.  Review of Systems   Review of Systems  Constitutional:  Positive for activity change.  Gastrointestinal:  Positive for abdominal pain, nausea and  vomiting.  All other systems reviewed and are negative.  Physical Exam Updated Vital Signs BP (!) 149/92 (BP Location: Left Arm)   Pulse 70   Temp 98.9 F (37.2 C) (Oral)   Resp 18   SpO2 100%   Physical Exam Vitals and nursing note reviewed.  Constitutional:      Appearance: He is well-developed.  HENT:     Head: Atraumatic.     Mouth/Throat:     Mouth: Mucous membranes are dry.  Cardiovascular:     Rate and Rhythm: Normal rate.  Pulmonary:     Effort: Pulmonary effort is normal.  Abdominal:     Palpations: Abdomen is soft.     Tenderness: There is no abdominal tenderness.  Musculoskeletal:     Cervical back: Neck supple.  Skin:    General: Skin is warm.  Neurological:     Mental Status: He is alert and oriented to person, place, and time.    ED Results / Procedures / Treatments   Labs (all labs ordered are listed, but only abnormal results are displayed) Labs Reviewed  COMPREHENSIVE METABOLIC PANEL - Abnormal; Notable for the following components:      Result Value   Glucose, Bld 100 (*)    BUN 30 (*)    Total Protein 8.6 (*)    All other components within normal limits  LIPASE, BLOOD  CBC  URINALYSIS, ROUTINE W REFLEX MICROSCOPIC    EKG None  Radiology No results found.  Procedures Procedures   Medications Ordered in ED Medications  sodium chloride 0.9 % bolus 1,000 mL (0 mLs Intravenous Stopped 07/06/21 1358)  pantoprazole (PROTONIX) injection 40 mg (40 mg Intravenous Given 07/06/21 1212)  haloperidol lactate (HALDOL) injection 5 mg (5 mg Intravenous Given 07/06/21 1212)    ED Course  I have reviewed the triage vital signs and the nursing notes.  Pertinent labs & imaging results that were available during my care of the patient were reviewed by me and considered in my medical decision making (see chart for details).  Clinical Course as of 07/06/21 1656  Mon Jul 06, 2021  1626 Pt reassessed. Pt's VSS and WNL. Pt's cap refill < 3 seconds. Pt  has been hydrated in the ER and now passed po challenge. We will discharge with antiemetic. Strict ER return precautions have been discussed and pt will return if he is unable to tolerate fluids and symptoms are getting worse.  [AN]    Clinical Course User Index [AN] Varney Biles, MD   MDM Rules/Calculators/A&P                           55 year old male comes in with chief complaint of nausea and vomiting.  He has some abdominal discomfort.  Abdomen is soft,  nontender.  He does appear slightly dry.  He reports that there is no history of diabetes or diabetic gastroparesis as listed in his chart.  He does admit to using cannabinoids.  During this visit we gave him Haldol, and patient felt a lot better.  He passed oral challenge.  Stable for discharge. ?  Cyclic vomiting syndrome at this time.  Final Clinical Impression(s) / ED Diagnoses Final diagnoses:  Dehydration  Nausea and vomiting in adult    Rx / DC Orders ED Discharge Orders          Ordered    ondansetron (ZOFRAN ODT) 8 MG disintegrating tablet  Every 8 hours PRN        07/06/21 1632             Varney Biles, MD 07/06/21 1658

## 2021-07-06 NOTE — ED Notes (Signed)
PT HAD A CUP OF WATER DONE WELL

## 2021-07-06 NOTE — ED Notes (Signed)
Pt reminded to try eating.

## 2021-07-06 NOTE — ED Notes (Signed)
Set crackers and water at pt's bedside. Pt states that he just wants to rest right now and will attempt PO challenge when he is ready.

## 2021-07-06 NOTE — ED Notes (Signed)
Pt also kept down crackers.

## 2021-07-06 NOTE — ED Triage Notes (Signed)
Pt reports being seen here recently for vomiting and dehydration. He reports the vomiting and mild abdominal pain started back up today. Denies diarrhea.

## 2021-12-24 DIAGNOSIS — R112 Nausea with vomiting, unspecified: Secondary | ICD-10-CM | POA: Insufficient documentation

## 2021-12-25 ENCOUNTER — Encounter (HOSPITAL_COMMUNITY): Payer: Self-pay

## 2021-12-25 ENCOUNTER — Emergency Department (HOSPITAL_COMMUNITY)
Admission: EM | Admit: 2021-12-25 | Discharge: 2021-12-25 | Disposition: A | Discharge status: Home / Self Care | Payer: Self-pay | Attending: Emergency Medicine | Admitting: Emergency Medicine

## 2021-12-25 ENCOUNTER — Other Ambulatory Visit: Payer: Self-pay

## 2021-12-25 DIAGNOSIS — R112 Nausea with vomiting, unspecified: Secondary | ICD-10-CM

## 2021-12-25 LAB — CBC WITH DIFFERENTIAL/PLATELET
Abs Immature Granulocytes: 0.04 10*3/uL (ref 0.00–0.07)
Basophils Absolute: 0 10*3/uL (ref 0.0–0.1)
Basophils Relative: 0 %
Eosinophils Absolute: 0 10*3/uL (ref 0.0–0.5)
Eosinophils Relative: 0 %
HCT: 45.9 % (ref 39.0–52.0)
Hemoglobin: 15.9 g/dL (ref 13.0–17.0)
Immature Granulocytes: 0 %
Lymphocytes Relative: 25 %
Lymphs Abs: 3.1 10*3/uL (ref 0.7–4.0)
MCH: 30.5 pg (ref 26.0–34.0)
MCHC: 34.6 g/dL (ref 30.0–36.0)
MCV: 88.1 fL (ref 80.0–100.0)
Monocytes Absolute: 1 10*3/uL (ref 0.1–1.0)
Monocytes Relative: 8 %
Neutro Abs: 8 10*3/uL — ABNORMAL HIGH (ref 1.7–7.7)
Neutrophils Relative %: 67 %
Platelets: 290 10*3/uL (ref 150–400)
RBC: 5.21 MIL/uL (ref 4.22–5.81)
RDW: 13 % (ref 11.5–15.5)
WBC: 12.1 10*3/uL — ABNORMAL HIGH (ref 4.0–10.5)
nRBC: 0 % (ref 0.0–0.2)

## 2021-12-25 LAB — COMPREHENSIVE METABOLIC PANEL
ALT: 21 U/L (ref 0–44)
AST: 23 U/L (ref 15–41)
Albumin: 5.2 g/dL — ABNORMAL HIGH (ref 3.5–5.0)
Alkaline Phosphatase: 76 U/L (ref 38–126)
Anion gap: 14 (ref 5–15)
BUN: 30 mg/dL — ABNORMAL HIGH (ref 6–20)
CO2: 20 mmol/L — ABNORMAL LOW (ref 22–32)
Calcium: 9.6 mg/dL (ref 8.9–10.3)
Chloride: 105 mmol/L (ref 98–111)
Creatinine, Ser: 1.16 mg/dL (ref 0.61–1.24)
GFR, Estimated: >60 mL/min (ref >60)
Glucose, Bld: 124 mg/dL — ABNORMAL HIGH (ref 70–99)
Potassium: 4.1 mmol/L (ref 3.5–5.1)
Sodium: 139 mmol/L (ref 135–145)
Total Bilirubin: 0.6 mg/dL (ref 0.3–1.2)
Total Protein: 9.7 g/dL — ABNORMAL HIGH (ref 6.5–8.1)

## 2021-12-25 MED ORDER — SODIUM CHLORIDE 0.9 % IV BOLUS
1000.0000 mL | Freq: Once | INTRAVENOUS | Status: AC
  Administered 2021-12-25: 1000 mL via INTRAVENOUS

## 2021-12-25 MED ORDER — METOCLOPRAMIDE HCL 5 MG/ML IJ SOLN
10.0000 mg | INTRAMUSCULAR | Status: AC
  Administered 2021-12-25: 10 mg via INTRAVENOUS
  Filled 2021-12-25: qty 2

## 2021-12-25 NOTE — ED Notes (Signed)
Patient provided with warm blanket for comfort ?

## 2021-12-25 NOTE — ED Provider Notes (Signed)
?[facility] DEPT ?Provider Note ? ? ?CSN: [id number] ?Arrival date & time: 12/24/21  2358 ? ?  ? ?History ? ?Chief Complaint  ?Patient presents with  ? Emesis  ? ? ?Lee Greer is a 56 y.o. male. ? ?The history is provided by the patient and medical records.  ?Emesis ? ?Lee Greer, presenting to the ED with nausea and vomiting.  States he woke up yesterday morning and drink a large quantity of water, vomited almost immediately afterwards but has had persistent nausea and vomiting throughout the day.  He denies any bloody or coffee-ground emesis.  He denies any abdominal pain.  He has not had any diarrhea.  No fever or chills.  Did try taking some ODT Zofran that he had at home without relief.  States he just feels dehydrated at this point. ? ?Home Medications ?Prior to Admission medications   ?Medication Sig Start Date End Date Taking? Authorizing Provider  ?acetaminophen (TYLENOL) 500 MG tablet Take 500 mg by mouth every 6 (six) hours as needed for moderate pain or headache.    [provider]  ?ondansetron (ZOFRAN ODT) 8 MG disintegrating tablet Take 1 tablet (8 mg total) by mouth every 8 (eight) hours as needed for nausea. 07/06/21   Varney Biles, MD  ?pantoprazole (PROTONIX) 20 MG tablet Take 1 tablet (20 mg total) by mouth daily. 01/16/21   Ripley Fraise, MD  ?   ? ?Allergies    ?Patient has no known allergies.   ? ?Review of Systems   ?Review of Systems  ?Gastrointestinal:  Positive for nausea and vomiting.  ?All other systems reviewed and are negative. ? ?Physical Exam ?Updated Vital Signs ?BP (!) 189/109 (BP Location: Right Arm)   Pulse 82   Temp 98.7 ?F (37.1 ?C) (Oral)   Resp 16   Ht '6\' 1"'$  (1.854 m)   Wt 58.7 kg   SpO2 99%   BMI 17.09 kg/m?  ? ?Physical Exam ?Vitals and nursing note reviewed.  ?Constitutional:   ?   Appearance: He is well-developed.  ?   Comments: Very thin  ?HENT:  ?   Head: Normocephalic and atraumatic.   ?   Mouth/Throat:  ?   Comments: Mucous membranes appear dry ?Eyes:  ?   Conjunctiva/sclera: Conjunctivae normal.  ?   Pupils: Pupils are equal, round, and reactive to light.  ?Cardiovascular:  ?   Rate and Rhythm: Normal rate and regular rhythm.  ?   Heart sounds: Normal heart sounds.  ?Pulmonary:  ?   Effort: Pulmonary effort is normal.  ?   Breath sounds: Normal breath sounds.  ?Abdominal:  ?   General: Bowel sounds are normal.  ?   Palpations: Abdomen is soft.  ?Musculoskeletal:     ?   General: Normal range of motion.  ?   Cervical back: Normal range of motion.  ?Skin: ?   General: Skin is warm and dry.  ?Neurological:  ?   Mental Status: He is alert and oriented to person, place, and time.  ? ? ?ED Results / Procedures / Treatments   ?Labs ?(all labs ordered are listed, but only abnormal results are displayed) ?Labs Reviewed  ?CBC WITH DIFFERENTIAL/PLATELET - Abnormal; Notable for the following components:  ?    Result Value  ? WBC 12.1 (*)   ? Neutro Abs 8.0 (*)   ? All other components within normal limits  ?COMPREHENSIVE METABOLIC PANEL - Abnormal; Notable for  the following components:  ? CO2 20 (*)   ? Glucose, Bld 124 (*)   ? BUN 30 (*)   ? Total Protein 9.7 (*)   ? Albumin 5.2 (*)   ? All other components within normal limits  ? ? ?EKG ?None ? ?Radiology ?No results found. ? ?Procedures ?Procedures  ? ? ?Medications Ordered in ED ?Medications  ?sodium chloride 0.9 % bolus 1,000 mL (0 mLs Intravenous Stopped 12/25/21 0455)  ?metoCLOPramide (REGLAN) injection 10 mg (10 mg Intravenous Given 12/25/21 0307)  ? ? ?ED Course/ Medical Decision Making/ A&P ?  ?                        ?Medical Decision Making ?Amount and/or Complexity of Data Reviewed ?Labs: ordered. ?ECG/medicine tests: ordered and independent interpretation performed. ? ?Risk ?Prescription drug management. ? ? ?56 year old male presenting to the ED with nausea and non-bloody emesis.  States began yesterday morning after drinking large quantity of  water.  No relief with ODT Zofran at home.  He is afebrile, nontoxic.  Mucous membranes do appear mildly dry.  His abdomen is soft and nontender without peritoneal signs.  Labs were obtained from triage and reviewed--does have elevated BUN at 30, mild leukocytosis but no fever or other infectious symptoms.  Will give IV fluids, Reglan. ? ?After fluids and medication, states he is feeling much better.  He is now tolerating oral fluids without difficulty.  He has not had any active emesis here in the ED.  Feel he is stable for discharge home.  Can continue Zofran PRN.  Follow-up with PCP.  Return here for any new/acute changes. ? ?Final Clinical Impression(s) / ED Diagnoses ?Final diagnoses:  ?Nausea and vomiting, unspecified vomiting type  ? ? ?Rx / DC Orders ?ED Discharge Orders   ? ? None  ? ?  ? ? ?  ?[name], PA-C ?12/25/21 0559 ? ?  ?[name], MD ?30/16/01 0732 ? ?

## 2021-12-25 NOTE — ED Notes (Signed)
Patient reports improvement in symptoms.  States he is "feeling so much better."  Able to tolerate PO intake ?

## 2021-12-25 NOTE — ED Triage Notes (Signed)
Pt reports with vomiting since yesterday and states that he feels that he is dehydrated.  ?

## 2021-12-25 NOTE — Discharge Instructions (Signed)
Can continue zofran as needed for nausea.  Make sure to hydrate, gentle diet for now and progress back to normal as tolerated. ?Follow-up with your primary care doctor. ?Return to the ED for new or worsening symptoms. ?

## 2022-01-27 ENCOUNTER — Encounter (HOSPITAL_COMMUNITY): Payer: Self-pay | Admitting: Emergency Medicine

## 2022-01-27 ENCOUNTER — Emergency Department (HOSPITAL_COMMUNITY)
Admission: EM | Admit: 2022-01-27 | Discharge: 2022-01-28 | Disposition: A | Discharge status: Home / Self Care | Payer: Self-pay | Attending: Emergency Medicine | Admitting: Emergency Medicine

## 2022-01-27 ENCOUNTER — Emergency Department (HOSPITAL_COMMUNITY): Payer: Self-pay

## 2022-01-27 ENCOUNTER — Other Ambulatory Visit: Payer: Self-pay

## 2022-01-27 DIAGNOSIS — S99921A Unspecified injury of right foot, initial encounter: Secondary | ICD-10-CM

## 2022-01-27 DIAGNOSIS — W28XXXA Contact with powered lawn mower, initial encounter: Secondary | ICD-10-CM | POA: Insufficient documentation

## 2022-01-27 DIAGNOSIS — S98141A Partial traumatic amputation of one right lesser toe, initial encounter: Secondary | ICD-10-CM | POA: Insufficient documentation

## 2022-01-27 DIAGNOSIS — Z23 Encounter for immunization: Secondary | ICD-10-CM | POA: Insufficient documentation

## 2022-01-27 LAB — CBC WITH DIFFERENTIAL/PLATELET
Abs Immature Granulocytes: 0 10*3/uL (ref 0.00–0.07)
Basophils Absolute: 0 10*3/uL (ref 0.0–0.1)
Basophils Relative: 0 %
Eosinophils Absolute: 0.2 10*3/uL (ref 0.0–0.5)
Eosinophils Relative: 2 %
HCT: 39.8 % (ref 39.0–52.0)
Hemoglobin: 12.8 g/dL — ABNORMAL LOW (ref 13.0–17.0)
Lymphocytes Relative: 68 %
Lymphs Abs: 7.5 10*3/uL — ABNORMAL HIGH (ref 0.7–4.0)
MCH: 29.8 pg (ref 26.0–34.0)
MCHC: 32.2 g/dL (ref 30.0–36.0)
MCV: 92.6 fL (ref 80.0–100.0)
Monocytes Absolute: 0.8 10*3/uL (ref 0.1–1.0)
Monocytes Relative: 7 %
Neutro Abs: 2.5 10*3/uL (ref 1.7–7.7)
Neutrophils Relative %: 23 %
Platelets: 257 10*3/uL (ref 150–400)
RBC: 4.3 MIL/uL (ref 4.22–5.81)
RDW: 14 % (ref 11.5–15.5)
WBC: 11 10*3/uL — ABNORMAL HIGH (ref 4.0–10.5)
nRBC: 0 % (ref 0.0–0.2)
nRBC: 0 /100 WBC

## 2022-01-27 LAB — BASIC METABOLIC PANEL
Anion gap: 11 (ref 5–15)
BUN: 9 mg/dL (ref 6–20)
CO2: 21 mmol/L — ABNORMAL LOW (ref 22–32)
Calcium: 9.4 mg/dL (ref 8.9–10.3)
Chloride: 108 mmol/L (ref 98–111)
Creatinine, Ser: 0.96 mg/dL (ref 0.61–1.24)
GFR, Estimated: >60 mL/min (ref >60)
Glucose, Bld: 103 mg/dL — ABNORMAL HIGH (ref 70–99)
Potassium: 3.9 mmol/L (ref 3.5–5.1)
Sodium: 140 mmol/L (ref 135–145)

## 2022-01-27 MED ORDER — LIDOCAINE HCL 2 % IJ SOLN: 10.0000 mL | Freq: Once | INTRAMUSCULAR | Status: AC

## 2022-01-27 MED ORDER — LIDOCAINE HCL 2 % IJ SOLN
INTRAMUSCULAR | Status: AC
  Administered 2022-01-27: 200 mg
  Filled 2022-01-27: qty 20

## 2022-01-27 MED ORDER — LIDOCAINE HCL 2 % IJ SOLN: 20.0000 mL | Freq: Once | INTRAMUSCULAR | Status: AC

## 2022-01-27 MED ORDER — TETANUS-DIPHTH-ACELL PERTUSSIS 5-2.5-18.5 LF-MCG/0.5 IM SUSY
0.5000 mL | PREFILLED_SYRINGE | Freq: Once | INTRAMUSCULAR | Status: AC
  Administered 2022-01-27: 0.5 mL via INTRAMUSCULAR
  Filled 2022-01-27: qty 0.5

## 2022-01-27 MED ORDER — LIDOCAINE HCL 2 % IJ SOLN
INTRAMUSCULAR | Status: AC
  Administered 2022-01-27: 400 mg
  Filled 2022-01-27: qty 20

## 2022-01-27 MED ORDER — CEFAZOLIN SODIUM-DEXTROSE 2-4 GM/100ML-% IV SOLN
2.0000 g | Freq: Once | INTRAVENOUS | Status: AC
  Administered 2022-01-27: 2 g via INTRAVENOUS
  Filled 2022-01-27: qty 100

## 2022-01-27 NOTE — ED Provider Triage Note (Signed)
Emergency Medicine Provider Triage Evaluation Note  Lee Greer , a 56 y.o. male  was evaluated in triage.  Pt complains of injury to the right foot.  He states that he ran over his right foot with a lawnmower shortly prior to arrival.  His pain is 10 out of 10.  Unknown last Tdap.   Physical Exam  BP 129/89   Pulse 76   Temp 98.6 F (37 C) (Oral)   Resp (!) 22   Ht '6\' 1"'$  (1.854 m)   Wt 60 kg   SpO2 100%   BMI 17.45 kg/m  Gen:   Awake, peers to be in pain Resp:  Normal effort  MSK:   Moves extremities without difficulty  Other:  Right foot is obviously deformed.  There is a large laceration across the dorsum of the left great toe.  The left second toe is partially amputated/avulsed with obvious visualization of the bones of the toe, the dorsal portion of the toe is folded under the toe and still attached by the plantar surface skin.  Scant active oozing.  Medical Decision Making  Medically screening exam initiated at 8:51 PM.  Appropriate orders placed.  [name] was informed that the remainder of the evaluation will be completed by another provider, this initial triage assessment does not replace that evaluation, and the importance of remaining in the ED until their evaluation is complete.  Basic labs ordered, x-ray, Tdap.   Lorin Glass, [place] 01/27/22 2053

## 2022-01-27 NOTE — ED Notes (Signed)
Pt signed physical consent form for amputation of second toe on right foot. Form filled out and signed by Dr. Mable Fill, orthopedic surgeon and witnessed by this RN

## 2022-01-27 NOTE — ED Provider Notes (Signed)
[facility] EMERGENCY DEPARTMENT Provider Note   CSN: [id number] Arrival date & time: 01/27/22  2029     History  Chief Complaint  Patient presents with   Foot Injury    Lee Greer is a 56 y.o. male.  56 year old male presents after rolling a lawnmower over his right foot just prior to arrival.  Complains of severe pain to the dorsum of his right foot.  He was noted to have bleeding as well as hanging digit.  Applied pressure and presents here.      Home Medications Prior to Admission medications   Medication Sig Start Date End Date Taking? Authorizing Provider  acetaminophen (TYLENOL) 500 MG tablet Take 500 mg by mouth every 6 (six) hours as needed for moderate pain or headache.    [provider]  ondansetron (ZOFRAN ODT) 8 MG disintegrating tablet Take 1 tablet (8 mg total) by mouth every 8 (eight) hours as needed for nausea. 07/06/21   Varney Biles, MD  pantoprazole (PROTONIX) 20 MG tablet Take 1 tablet (20 mg total) by mouth daily. 01/16/21   Ripley Fraise, MD      Allergies    Patient has no known allergies.    Review of Systems   Review of Systems  All other systems reviewed and are negative.  Physical Exam Updated Vital Signs BP 129/89   Pulse 76   Temp 98.6 F (37 C) (Oral)   Resp (!) 22   Ht 1.854 m ('6\' 1"'$ )   Wt 60 kg   SpO2 100%   BMI 17.45 kg/m  Physical Exam Vitals and nursing note reviewed.  Constitutional:      General: He is not in acute distress.    Appearance: Normal appearance. He is well-developed. He is not toxic-appearing.  HENT:     Head: Normocephalic and atraumatic.  Eyes:     General: Lids are normal.     Conjunctiva/sclera: Conjunctivae normal.     Pupils: Pupils are equal, round, and reactive to light.  Neck:     Thyroid: No thyroid mass.     Trachea: No tracheal deviation.  Cardiovascular:     Rate and Rhythm: Normal rate and regular rhythm.     Heart sounds: Normal heart sounds. No  murmur heard.   No gallop.  Pulmonary:     Effort: Pulmonary effort is normal. No respiratory distress.     Breath sounds: Normal breath sounds. No stridor. No decreased breath sounds, wheezing, rhonchi or rales.  Abdominal:     General: There is no distension.     Palpations: Abdomen is soft.     Tenderness: There is no abdominal tenderness. There is no rebound.  Musculoskeletal:        General: No tenderness. Normal range of motion.     Cervical back: Normal range of motion and neck supple.     Comments: Please see pictures of the right foot.  Patient has open amputation at mid right second digit.  Has laceration on dorsum of right great toe.  Neurovascular intact distal at the right great toe.  No sensation in the appendage from the right second toe.  Skin:    General: Skin is warm and dry.     Findings: No abrasion or rash.  Neurological:     Mental Status: He is alert and oriented to person, place, and time. Mental status is at baseline.     GCS: GCS eye subscore is 4. GCS verbal subscore is 5.  GCS motor subscore is 6.     Cranial Nerves: No cranial nerve deficit.     Sensory: No sensory deficit.     Motor: Motor function is intact.  Psychiatric:        Attention and Perception: Attention normal.        Speech: Speech normal.        Behavior: Behavior normal.       ED Results / Procedures / Treatments   Labs (all labs ordered are listed, but only abnormal results are displayed) Labs Reviewed  CBC WITH DIFFERENTIAL/PLATELET  BASIC METABOLIC PANEL    EKG None  Radiology DG Foot Complete Right  Result Date: 01/27/2022 CLINICAL DATA:  Ran over foot with lawn mower. EXAM: RIGHT FOOT COMPLETE - 3+ VIEW COMPARISON:  None Available. FINDINGS: Exam detail is diminished due to overlapping bone fragments secondary to traumatic amputation of the second ray. The distal phalanx is completely amputated and overlies the second and third middle phalanges. Comminuted fracture  involving the second middle phalanx is noted. There is also a fracture through the medial aspect of the head of the second proximal phalanx. A transverse fracture deformity is also suspected involving the head of the fifth middle phalanx. The first, third and fourth rays appear intact. IMPRESSION: Traumatic amputation of the second ray at the level of the distal shaft of the second middle phalanx. Fractures also noted the second middle phalanx, second proximal phalanx and head of the fifth middle phalanx. Electronically Signed   By: Kerby Moors M.D.   On: 01/27/2022 21:21    Procedures Procedures    Medications Ordered in ED Medications  lidocaine (XYLOCAINE) 2 % (with pres) injection (has no administration in time range)  Tdap (BOOSTRIX) injection 0.5 mL (0.5 mLs Intramuscular Given 01/27/22 2117)    ED Course/ Medical Decision Making/ A&P                           Medical Decision Making Risk Prescription drug management.   Patient given digital nerve block with a cc of 2% lidocaine 2 right great toe and right second toe.  Patient had good analgesia with this.  Was given tetanus immunization as well as started on 2 g of Ancef for possible infection.  Patient has the above injuries as noted.  Discussed with Dr. Mable Fill from orthopedics who will come see patient in ED.        Final Clinical Impression(s) / ED Diagnoses Final diagnoses:  None    Rx / DC Orders ED Discharge Orders     None         Lacretia Leigh, MD 01/27/22 2149

## 2022-01-27 NOTE — ED Notes (Signed)
RN called orthopedic technician for application of post op shoe and crutches for pt

## 2022-01-27 NOTE — ED Triage Notes (Addendum)
Patient here POV after running his R foot over with push lawn mower. Big toe and second digit avulsed. 2nd toe with distal part of digit hanging off. Patient has it wrapped in towel with bleeding controlled. Distal peripheral nervous system intact. Pain 10/10

## 2022-01-28 MED ORDER — TRAMADOL HCL 50 MG PO TABS: 50.0000 mg | ORAL_TABLET | Freq: Four times a day (QID) | ORAL | 0 refills | Status: DC | PRN

## 2022-01-28 MED ORDER — CEPHALEXIN 500 MG PO CAPS: 500.0000 mg | ORAL_CAPSULE | Freq: Four times a day (QID) | ORAL | 0 refills | Status: DC

## 2022-01-28 NOTE — ED Provider Notes (Signed)
Patient signed out pending Ortho evaluation and repair.  Discussed with Dr. Mable Fill.  Patient is repaired at the bedside.  Request postop shoe and crutches.  Follow-up in 1 week with Dr. Mable Fill.  Patient will be given a short course of pain medication and antibiotics   Physical Exam  BP (!) 130/102   Pulse 72   Temp 98.6 F (37 C) (Oral)   Resp 18   Ht 1.854 m ('6\' 1"'$ )   Wt 60 kg   SpO2 100%   BMI 17.45 kg/m   Problem List Items Addressed This Visit   None Visit Diagnoses     Injury of right foot, initial encounter    -  Primary            Merryl Hacker, MD 01/28/22 [phone]

## 2022-01-28 NOTE — Discharge Instructions (Addendum)
You were seen today for an injury to the foot.  You have open fractures of the foot and toe.  Take antibiotics as prescribed.  Take pain medication as prescribed.  Follow-up with Dr. Mable Fill in 1 week.

## 2022-01-28 NOTE — ED Notes (Signed)
Pt states wife is here to get him. Verified with primary RN that pt is ready for discharge. Pt wheeled out to waiting room

## 2022-01-28 NOTE — Op Note (Signed)
OPERATIVE NOTE  Lee Greer male 56 y.o. 01/28/2022  PREOPERATIVE DIAGNOSIS: Right second toe laceration Right second toe proximal and middle phalanx fractures Right dorsal hallux laceration  POSTOPERATIVE DIAGNOSIS: Right second toe laceration, without foreign body, with nailbed involvement (S91.241A) Right second toe open fractures of proximal (S92.511B) and middle (S92.521B) phalanges Right second toe traumatic partial amputation (Q76.195K) Right hallux laceration, without foreign body, without nailbed involvement (S91.111A)   PROCEDURE(S): Exploration right second toe wound (20103) Exploration right hallux wound; separate anatomic site (20103-59) Right second toe amputation, MTP level (93267) Complex closure right hallux wound, 2.0 cm (13131) Complex closure right second toe wound, 1.1 cm; separate anatomic site (12458-09)  SURGEON: Georgeanna Harrison, M.D.  ASSISTANT(S): None  ANESTHESIA:   FINDINGS: Preoperative Examination: Right dorsal hallux laceration, approximately 2.0 cm, overlying EHL tendon.  Patient able to extend hallux against resistance.  Separate laceration involving almost entire dorsal surface of second toe.  X-ray demonstrating fractures of proximal and middle phalanges of right second toe.  No motor function in second toe.  Patient examined after digital block by EDP but per EDP no sensation present in second digit distal to mid-proximal phalanx.  Operative Findings: EHL intact within right hallux laceration.  No foreign bodies or contamination present in right hallux wound.  Laceration of right second toe without foreign bodies or contamination, with involvement of nailbed.  Open fractures of right second toe proximal and middle phalanges.  Partial amputation of right second toe through middle of proximal phalanx, with only small plantar tissue bridge remaining.    IMPLANTS: * No surgical log found *  INDICATIONS:  The patient is a 56 y.o. male who  injured his right foot with a lawnmower, resulting in open wounds of the right hallux and second toe.  X-rays demonstrated fractures the right second toe proximal and middle phalanges.  Given the appearance and location of the right second toe wound, there was concern for open fractures and possible partial amputation.  The location of the hallux wound was concerning for injury to the EHL.  Nonoperative treatment consisting of antibiotics and wound care was discussed but was felt to be associated with significant morbidity.  Therefore the patient was recommended to undergo exploration of the right hallux and second toe wounds with possible revision of partial amputation of the second digit to complete amputation, and wound closure.  He understood the risks, benefits and alternatives to surgery which include but are not limited to bleeding, wound healing complications, infection, damage to surrounding structures, persistent pain, stiffness, lack of improvement, and potential need for additional procedures/surgeries in the future.  He also understood the potential for continued pain, and that there were no guarantees of acceptable outcome.  After weighing these risks the patient opted to proceed with the procedure.  TECHNIQUE: Patient was identified in his room in the Emergency Department.  The nature of the patient's injuries were fully and clearly discussed, including the concern for partial amputation of the right second toe, and that this was likely not a reconstructable injury.  The right foot was marked by myself.  Consent was signed by myself and the patient.  The skin was thoroughly cleansed with alcohol preps, and digital blocks of the hallux and second toe were administered with 2% lidocaine without epinephrine until appropriate levels of anesthesia were achieved.  Subsequently during the procedure, a sterile syringe was used to locally infiltrated local anesthetic.  The patient was administered Ancef and  tetanus prophylaxis.  The right  foot was prepped with Betadine and draped with sterile towels, and a standard surgical timeout was performed.  The second toe wound was copiously irrigated with a dilute Betadine solution, followed by normal saline, and the wound was carefully and methodically explored and evaluated.  There was a degloving of the entire dorsal 1/3 of the digit which extended into the nailbed, and open fractures of the distal and middle phalanges were readily identified.  All tendinous anatomy was completely disrupted distal to the level of the proximal phalanx.  This injury amounted to a traumatic partial amputation at the level of the mid-proximal phalanx with only a plantar skin bridge remaining intact.  Given the nature of the injury and degree soft tissue deficit to the level of the mid-proximal phalanx, the digit was unsalvageable.  I proceeded to sharply skeletonize the proximal phalanx to the level of the MTP joint with a #15 blade and scissors in order to revise the amputation to the MTPJ level, which would allow for sufficient healthy skin and soft tissue to achieve a primary closure.  The remaining portion of the proximal phalanx was freed of soft tissue attachments and disarticulated at the MTP joint.  A plantar-based skin flap was fashioned to achieve a primary closure, bringing the plantar tissue dorsally.  The wound was again copiously irrigated first with dilute Betadine solution, followed by sterile saline.  At the conclusion of irrigation, the wound was very clean with bleeding viable tissues.  Appropriate levels of hemostasis were obtained.  The wound was closed with simple 2-0 nylon stitches, resulting in a final size of 1.1 cm.  Attention was turned to the right dorsal hallux wound, which was a separate wound.  This wound was overlying the EHL tendon at the IP joint and did not extend to involve the nailbed.  The dorsal hallux wound was thoroughly irrigated, once again first with  dilute betadine solution followed by sterile saline, and was carefully and methodically explored. The EHL tendon was identified and evaluated, and was found to be intact.  The dorsal hallux wound was copiously irrigated with normal saline, and a closure of the laceration was performed with a combination of simple and horizontal mattress sutures using 2-0 nylon.  This laceration measured 2.0 cm.  In total 1 L sterile saline was used for irrigation between both wounds during the procedure.  Wounds were dressed with Xeroform, 4 x 4's, Kling gauze, and Kerlix, and secured with Coban wrap over the foot.  The patient tolerated the procedure well.  There were no complications.  POST OPERATIVE INSTRUCTIONS: Mobility: Keep right foot elevated higher than heart is much as possible.  Use crutches for mobility. Pain control: Continue to wean/titrate to appropriate oral regimen DVT Prophylaxis: Not indicated Further surgical plans: None RLE: Nonweightbearing Keflex 500 mg QID x7 days Disposition: Home with follow-up in 1 week Dressing care:  Keep dressings clean, dry, and intact.  Dressings will be removed in clinic. Follow-up: Please call [facility] [phone]) to schedule follow-op appointment for 2 weeks after surgery.  TOURNIQUET TIME: N/A  BLOOD LOSS: 5 cc         DRAINS: none         SPECIMEN: none       COMPLICATIONS: None         DISPOSITION:  Appropriate for discharge home from ER         CONDITION: stable   Georgeanna Harrison M.D. Orthopaedic Surgery [facility]  Portions of the record have been created with voice recognition software.  Grammatical and punctuation errors, random word insertions, wrong-word or "sound-a-like" substitutions, pronoun errors (inaccuracies and/or substitutions), and/or incomplete sentences may have occurred due to the inherent limitations of voice recognition software.  Not all errors are caught or  corrected.  Although every attempt is made to root out erroneous and incomplete transcription, the note may still not fully represent the intent or opinion of the author.  Read the chart carefully and recognize, using context, where errors/substitutions have occurred.  Any questions or concerns about the content of this note or information contained within the body of this dictation should be addressed directly with the author for clarification.

## 2022-01-28 NOTE — Progress Notes (Signed)
Orthopedic Tech Progress Note Patient Details:  Lee Greer [birth year] [id number]  Ortho Devices Type of Ortho Device: Postop shoe/boot, Crutches Ortho Device/Splint Location: rle Ortho Device/Splint Interventions: Ordered, Application, Adjustment   Post Interventions Patient Tolerated: Well Instructions Provided: Care of device, Adjustment of device  Karolee Stamps 01/28/2022, 1:41 AM

## 2022-01-28 NOTE — Consult Note (Signed)
Orthopaedic Consult  Date/Time: 01/28/22 12:05 AM  Patient Name: Lee Greer  Attending Physician: Lacretia Leigh, MD    ASSESSMENT & PLAN  Orthopaedic Assessment: 56 y.o. male with a traumatic amputation of his right second toe through the proximal phalanx and a dorsal hallux laceration as a result of a lawnmower running over his foot.  Reductions/Procedures/Splinting/Anesthesia Performed: Reductions: None Splinting/casting: None Procedure(s):  Right second toe completion amputation with primary closure Exploration and closure of right dorsal hallux wound Anesthesia: Local digital block with 2% lidocaine and local infiltration  Plan: Had a long discussion with the patient that the traumatic lawnmower injury essentially amputated his second toe at the proximal phalanx level, and that a reconstructive procedure is not recommended.  Explained that if his EHL is intact, closure would be appropriate to address the dorsal hallux wound.  Unfortunately, for his second toe amputation, a completion amputation at the level of the MTP would likely given the most predictable healing and successful return to function, given the soft tissue deficiencies at the level of the injury.  We discussed that nonoperative treatment is not recommended due to the significant risk of infection with this injury.  Having fully considered the risks, benefits, and alternatives to the proposed treatment, patient understood and wished to proceed.  Please refer to additional procedural documentation for details regarding procedure.  Following completion of the procedure and dressing the wounds, patient will be nonweightbearing in a postoperative shoe with crutches for ambulation.  He will remain on antibiotics prescribed by ED provider for 1 week, and will follow up with me in clinic next Wednesday for wound check.  He will keep his dressing clean, dry, and intact in the meantime, and will elevate the foot higher than the  heart is much as possible.  All of his questions were answered.   Georgeanna Harrison M.D. Orthopaedic Surgery [facility]   Medical Decision Making  Amount/complexity of data: Is there a pathologic fracture (e.g. neoplastic, osteoporotic insufficiency fracture)? No Independent interpretation of radiographic studies: Yes Review of radiology results (e.g. reports): Yes Tests ordered (e.g. additional radiographic studies, labs): No Lab results reviewed: Yes Reviewed old records: N/A History from another source (independent historian, e.g. family/friend/etc.): No Risk: Patient receiving IV controlled substances for pain: Yes Fracture requiring manipulation: No Urgent or emergent (non-elective) surgery likely this admission: Yes Presence of medical comorbidities and/or surgical risk factors (e.g. current smoker, CAD, diabetes, COPD, CKD, etc.): No Closed fracture management WITHOUT manipulation: No Urgent minor procedure (e.g. joint aspiration, compartment pressure measurement, etc.): No Will likely need surgery as an outpatient: No     HPI Lee Greer is a 56 y.o. male. Orthopaedic consultation has specifically been requested to address this patient's current musculoskeletal presentation.  He was mowing his lawn in [place] earlier this evening, and bent down to pick up a garden hose and the lawnmower ran over his right foot.  This resulted in immediate pain which was sharp and severe.  He noticed bleeding from his forefoot, and was brought to the emergency room.   PMH Past Medical History:  Diagnosis Date   Acute kidney injury ([place]) 04/13/2017   related to "dehydration" (04/14/2017)   Cannabinoid hyperemesis syndrome    Diabetes mellitus without complication ([place])    Esophageal tear    Gastric ulcer    Gastritis with intestinal metaplasia of stomach    Gastroparesis    H. pylori infection    Hypokalemia    Malnutrition of  moderate degree Altamease Oiler: 60% to  less than 75% of standard weight) ([facility])    Posthemorrhagic anemia    Upper GI bleed      PSH Past Surgical History:  Procedure Laterality Date   BIOPSY  10/04/2018   Procedure: BIOPSY;  Surgeon: Jerene Bears, MD;  Location: [facility] ENDOSCOPY;  Service: Endoscopy;;   ESOPHAGOGASTRODUODENOSCOPY (EGD) WITH PROPOFOL N/A 09/27/2018   Procedure: ESOPHAGOGASTRODUODENOSCOPY (EGD) WITH PROPOFOL;  Surgeon: Juanita Craver, MD;  Location[facility] ENDOSCOPY;  Service: Endoscopy;  Laterality: N/A;   ESOPHAGOGASTRODUODENOSCOPY (EGD) WITH PROPOFOL N/A 10/04/2018   Procedure: ESOPHAGOGASTRODUODENOSCOPY (EGD) WITH PROPOFOL;  Surgeon: Jerene Bears, MD;  Location: [facility] ENDOSCOPY;  Service: Endoscopy;  Laterality: N/A;   HOT HEMOSTASIS N/A 10/04/2018   Procedure: HOT HEMOSTASIS (ARGON PLASMA COAGULATION/BICAP);  Surgeon: Jerene Bears, MD;  Location: [facility];  Service: Endoscopy;  Laterality: N/A;   Home Medications Prior to Admission medications   Medication Sig Start Date End Date Taking? Authorizing Provider  acetaminophen (TYLENOL) 500 MG tablet Take 500 mg by mouth every 6 (six) hours as needed for moderate pain or headache.    [provider]  ondansetron (ZOFRAN ODT) 8 MG disintegrating tablet Take 1 tablet (8 mg total) by mouth every 8 (eight) hours as needed for nausea. 07/06/21   Varney Biles, MD  pantoprazole (PROTONIX) 20 MG tablet Take 1 tablet (20 mg total) by mouth daily. 01/16/21   Ripley Fraise, MD     Allergies No Known Allergies   Family History Family History  Problem Relation Age of Onset   CAD Mother    Diabetes Neg Hx    Stroke Neg Hx    Colon cancer Neg Hx    Esophageal cancer Neg Hx    Ulcerative colitis Neg Hx    Stomach cancer Neg Hx     Social History Social History   Socioeconomic History   Marital status: Single    Spouse name: Not on file   Number of children: Not on file   Years of education: Not on file   Highest education level: Not on file  Occupational  History   Not on file  Tobacco Use   Smoking status: Never   Smokeless tobacco: Never  Vaping Use   Vaping Use: Never used  Substance and Sexual Activity   Alcohol use: Not Currently    Comment: 04/14/2017 "sober since 03/2008"   Drug use: Not Currently    Types: Marijuana    Comment: last used 1 month ago   Sexual activity: Yes  Other Topics Concern   Not on file  Social History Narrative   Not on file   Social Determinants of Health   Financial Resource Strain: Not on file  Food Insecurity: Not on file  Transportation Needs: Not on file  Physical Activity: Not on file  Stress: Not on file  Social Connections: Not on file  Intimate Partner Violence: Not on file     Review of Systems MSK: As noted per HPI above GI: No current Nausea/vomiting ENT: Denies sore throat, epistaxis CV: Denies chest pain  Resp: No current shortness of breath  Other than mentioned above, there are no Constitutional, Neurological, Psychiatric, ENT, Ophthalmological, Cardiovascular, Respiratory, GI, GU, Musculoskeletal, Integumentary, Lymphatic, Endocrine or Allergic issues.     Imaging  Independent interpretation of orthopaedic-relevant films: Multiple radiographic views of the right foot demonstrate multiple fractures in the second digit with the most proximal through the proximal phalanx.  Radiographic results: DG Foot Complete  Right  Result Date: 01/27/2022 CLINICAL DATA:  Ran over foot with lawn mower. EXAM: RIGHT FOOT COMPLETE - 3+ VIEW COMPARISON:  None Available. FINDINGS: Exam detail is diminished due to overlapping bone fragments secondary to traumatic amputation of the second ray. The distal phalanx is completely amputated and overlies the second and third middle phalanges. Comminuted fracture involving the second middle phalanx is noted. There is also a fracture through the medial aspect of the head of the second proximal phalanx. A transverse fracture deformity is also suspected  involving the head of the fifth middle phalanx. The first, third and fourth rays appear intact. IMPRESSION: Traumatic amputation of the second ray at the level of the distal shaft of the second middle phalanx. Fractures also noted the second middle phalanx, second proximal phalanx and head of the fifth middle phalanx. Electronically Signed   By: Kerby Moors M.D.   On: 01/27/2022 21:21   Labs  Recent Labs    01/27/22 2057  WBC 11.0*  HGB 12.8*  HCT 39.8  PLT 257   Recent Labs    01/27/22 2057  NA 140  K 3.9  CL 108  CO2 21*  BUN 9  CREATININE 0.96  GLUCOSE 103*  CALCIUM 9.4   Lab Results  Component Value Date   INR 1.26 09/26/2018        Physical Examination  Patient is a 56 y.o. year old male who is alert, well appearing, and in no distress, mood is calm.  Orientation: oriented to person, place, time, and general circumstances  Vital Signs: BP (!) 130/102   Pulse 72   Temp 98.6 F (37 C) (Oral)   Resp 18   Ht '6\' 1"'$  (1.854 m)   Wt 60 kg   SpO2 100%   BMI 17.45 kg/m    Gait: Unable to ambulate due to injury, supine on stretcher.  Heart: Normal rate Lungs: Non-labored breathing Abdomen: Soft, Non-tender   Right Upper Extremity: Inspection: Atraumatic Palpation: Nontender ROM: Full, painless Joint Stability: No instability Strength: Normal Skin: Intact Peripheral Vascular: Well perfused Reflexes: No pathologic Sensation: Intact to light touch distally Lymph Nodes: None Palpable Coordination: Intact, normal   Left Upper Extremity: Inspection: Atraumatic Palpation: Nontender ROM: Full, painless Joint Stability: No instability Strength: Normal Skin: Intact Peripheral Vascular: Well perfused Reflexes: No pathologic Sensation: Intact to light touch distally Lymph Nodes: None Palpable Coordination: Intact, normal    Right Lower Extremity: Inspection: Traumatic amputation of second toe through proximal metatarsal.  EHL appears intact through dorsal  hallux wound. Palpation: Examination performed after administration of local by EDP, so no areas of tenderness noted ROM: No motor function distal to level of amputation.  Painless hallux motion.  Normal digital motion in the third, fourth, and fifth toes. Joint Stability: Hallux and third, fourth, and fifth toes stable Strength: No motor function in traumatically amputated second digit.  Able to fire EHL and FHL against manual resistance. Skin: Dorsal laceration of hallux.  Complex soft tissue envelope wound associated with traumatic amputation of second digit. Peripheral Vascular: Hallux and third, fourth, and fifth toes well-perfused. Reflexes: Examination deferred Sensation: No sensation due to digital block by EDP prior to examination. Lymph Nodes: None Palpable Coordination: Limited evaluation due to injury   Left Lower Extremity: Inspection: Atraumatic Palpation: Nontender ROM: Full, painless Joint Stability: No instability Strength: Normal Skin: Intact Peripheral Vascular: Well perfused Reflexes: No pathologic Sensation: Intact to light touch distally Lymph Nodes: None Palpable Coordination: Intact, normal  Pelvis: Skin: Intact Palpation: Nontender Stability: No instability      The review of the patient's medications does not in any way constitute an endorsement, by this clinician,  of their use, dosage, indications, route, efficacy, interactions, or other clinical parameters.  This note was generated within the [employer] EMR using Dragon medical speech recognition software and may contain inherent errors or omissions not intended by the user. Grammatical and punctuation errors, random word insertions, deletions, pronoun errors and incomplete sentences are occasional consequences of this technology due to software limitations. Not all errors are caught or corrected.  Although every attempt is made to root out erroneus and incomplete transcription, the note may still not fully  represent the intent or opinion of the author. If there are questions or concerns about the content of this note or information contained within the body of this dictation they should be addressed directly with the author for clarification.

## 2022-01-28 NOTE — ED Notes (Signed)
E-signature pad unavailable at time of pt discharge. This RN discussed discharge materials with pt and answered all pt questions. Pt stated understanding of discharge material. ? ?

## 2022-03-01 ENCOUNTER — Encounter: Payer: Self-pay | Admitting: Internal Medicine

## 2022-04-07 ENCOUNTER — Encounter: Payer: Self-pay | Admitting: Internal Medicine

## 2022-04-08 ENCOUNTER — Encounter: Payer: Self-pay | Admitting: Internal Medicine

## 2022-05-04 ENCOUNTER — Ambulatory Visit (AMBULATORY_SURGERY_CENTER): Payer: Self-pay | Admitting: *Deleted

## 2022-05-04 VITALS — Ht 73.0 in | Wt 145.0 lb

## 2022-05-04 DIAGNOSIS — Z86010 Personal history of colonic polyps: Secondary | ICD-10-CM

## 2022-05-04 DIAGNOSIS — Z8711 Personal history of peptic ulcer disease: Secondary | ICD-10-CM

## 2022-05-04 DIAGNOSIS — K31A0 Gastric intestinal metaplasia, unspecified: Secondary | ICD-10-CM

## 2022-05-04 DIAGNOSIS — K2970 Gastritis, unspecified, without bleeding: Secondary | ICD-10-CM

## 2022-05-04 MED ORDER — PLENVU 140 G PO SOLR: 1.0000 | Freq: Once | ORAL | 0 refills | Status: AC

## 2022-05-04 NOTE — Progress Notes (Signed)
Patient is here in-person for PV. Patient denies any allergies to eggs or soy. Patient denies any problems with anesthesia/sedation. Patient is not on any oxygen at home. Patient is not taking any diet/weight loss medications or blood thinners. Went over procedure prep instructions with the patient. Patient is aware of our care-partner policy. Plenvu sample given to pt.

## 2022-05-09 ENCOUNTER — Other Ambulatory Visit: Payer: Self-pay

## 2022-05-09 ENCOUNTER — Encounter (HOSPITAL_COMMUNITY): Payer: Self-pay

## 2022-05-09 ENCOUNTER — Emergency Department (HOSPITAL_COMMUNITY)
Admission: EM | Admit: 2022-05-09 | Discharge: 2022-05-10 | Disposition: A | Discharge status: Home / Self Care | Payer: Self-pay | Attending: Emergency Medicine | Admitting: Emergency Medicine

## 2022-05-09 DIAGNOSIS — R112 Nausea with vomiting, unspecified: Secondary | ICD-10-CM | POA: Insufficient documentation

## 2022-05-09 DIAGNOSIS — E860 Dehydration: Secondary | ICD-10-CM | POA: Insufficient documentation

## 2022-05-09 NOTE — ED Triage Notes (Signed)
Patient said he is dehydrated. He had a bowel movement yesterday and "pooped his guts out." Vomited x5 in 2 days.

## 2022-05-10 LAB — COMPREHENSIVE METABOLIC PANEL
ALT: 17 U/L (ref 0–44)
AST: 21 U/L (ref 15–41)
Albumin: 5.1 g/dL — ABNORMAL HIGH (ref 3.5–5.0)
Alkaline Phosphatase: 83 U/L (ref 38–126)
Anion gap: 14 (ref 5–15)
BUN: 26 mg/dL — ABNORMAL HIGH (ref 6–20)
CO2: 21 mmol/L — ABNORMAL LOW (ref 22–32)
Calcium: 9.9 mg/dL (ref 8.9–10.3)
Chloride: 106 mmol/L (ref 98–111)
Creatinine, Ser: 1.22 mg/dL (ref 0.61–1.24)
GFR, Estimated: >60 mL/min (ref >60)
Glucose, Bld: 124 mg/dL — ABNORMAL HIGH (ref 70–99)
Potassium: 4 mmol/L (ref 3.5–5.1)
Sodium: 141 mmol/L (ref 135–145)
Total Bilirubin: 0.6 mg/dL (ref 0.3–1.2)
Total Protein: 9.3 g/dL — ABNORMAL HIGH (ref 6.5–8.1)

## 2022-05-10 LAB — CBC
HCT: 45.1 % (ref 39.0–52.0)
Hemoglobin: 15.4 g/dL (ref 13.0–17.0)
MCH: 30.9 pg (ref 26.0–34.0)
MCHC: 34.1 g/dL (ref 30.0–36.0)
MCV: 90.6 fL (ref 80.0–100.0)
Platelets: 297 10*3/uL (ref 150–400)
RBC: 4.98 MIL/uL (ref 4.22–5.81)
RDW: 13.1 % (ref 11.5–15.5)
WBC: 13.3 10*3/uL — ABNORMAL HIGH (ref 4.0–10.5)
nRBC: 0 % (ref 0.0–0.2)

## 2022-05-10 LAB — LIPASE, BLOOD: Lipase: 21 U/L (ref 11–51)

## 2022-05-10 MED ORDER — ONDANSETRON HCL 4 MG/2ML IJ SOLN
4.0000 mg | Freq: Once | INTRAMUSCULAR | Status: AC
  Administered 2022-05-10: 4 mg via INTRAVENOUS
  Filled 2022-05-10: qty 2

## 2022-05-10 MED ORDER — LACTATED RINGERS IV BOLUS
1000.0000 mL | Freq: Once | INTRAVENOUS | Status: AC
  Administered 2022-05-10: 1000 mL via INTRAVENOUS

## 2022-05-10 NOTE — ED Notes (Signed)
Pt was given water at bedside.

## 2022-05-10 NOTE — ED Provider Notes (Signed)
[facility] DEPT Provider Note   CSN: [id number] Arrival date & time: 05/09/22  2324     History  Chief Complaint  Patient presents with   Dehydration    Lee Greer is a 56 y.o. male.  The history is provided by the patient.  Lee Greer is a 56 y.o. male who presents to the Emergency Department complaining of dehydration.  Yesterday he had 2 large BMs and then felt very nauseated.  He had 5 episodes of yellow emesis after this.  No associated fever.  No hematochezia or melena.  He states his stomach feels uncomfortable from dry heaving but has no abdominal pain.  He did take a nausea medicine prior to ED arrival and states that his nausea is better.  No associated fever.  No dysuria.   No tobacco, alcohol, drugs.  Sees Dr Raquel James with GI    Home Medications Prior to Admission medications   Medication Sig Start Date End Date Taking? Authorizing Provider  Multiple Vitamin (ONE-A-DAY MENS PO) Take 1 tablet by mouth daily.   Yes [provider]      Allergies    Patient has no known allergies.    Review of Systems   Review of Systems  All other systems reviewed and are negative.   Physical Exam Updated Vital Signs BP (!) 149/119 (BP Location: Right Arm)   Pulse 84   Temp 98.6 F (37 C) (Oral)   Resp 16   SpO2 100%  Physical Exam Vitals and nursing note reviewed.  Constitutional:      Appearance: He is well-developed.  HENT:     Head: Normocephalic and atraumatic.  Cardiovascular:     Rate and Rhythm: Normal rate and regular rhythm.  Pulmonary:     Effort: Pulmonary effort is normal. No respiratory distress.  Abdominal:     Palpations: Abdomen is soft.     Tenderness: There is no abdominal tenderness. There is no guarding or rebound.  Musculoskeletal:        General: No swelling or tenderness.  Skin:    General: Skin is warm and dry.  Neurological:     Mental Status: He is alert and oriented to person,  place, and time.  Psychiatric:        Behavior: Behavior normal.     ED Results / Procedures / Treatments   Labs (all labs ordered are listed, but only abnormal results are displayed) Labs Reviewed  COMPREHENSIVE METABOLIC PANEL - Abnormal; Notable for the following components:      Result Value   CO2 21 (*)    Glucose, Bld 124 (*)    BUN 26 (*)    Total Protein 9.3 (*)    Albumin 5.1 (*)    All other components within normal limits  CBC - Abnormal; Notable for the following components:   WBC 13.3 (*)    All other components within normal limits  LIPASE, BLOOD  URINALYSIS, ROUTINE W REFLEX MICROSCOPIC    EKG None  Radiology No results found.  Procedures Procedures    Medications Ordered in ED Medications  lactated ringers bolus 1,000 mL (0 mLs Intravenous Stopped 05/10/22 0620)  ondansetron (ZOFRAN) injection 4 mg (4 mg Intravenous Given 05/10/22 0309)    ED Course/ Medical Decision Making/ A&P                           Medical Decision Making Amount and/or Complexity of  Data Reviewed Labs: ordered.  Risk Prescription drug management.   Patient here for evaluation of concern for dehydration in setting of her recent emesis and diarrhea.  Abdominal examination is soft and nontender.  CBC with mild leukocytosis.  BUN is mildly elevated.  He was treated with IV fluid hydration as well as antiemetic.  On repeat evaluation patient is feeling improved, able to tolerate oral fluids.  No recurrent nausea or vomiting during his ED stay.  Repeat abdominal examination is nontender.  Current clinical picture is not consistent with acute abdomen, acute GI bleed.  Discussed with patient home care for nausea, vomiting and diarrhea.  Discussed outpatient follow-up and return precautions.        Final Clinical Impression(s) / ED Diagnoses Final diagnoses:  Dehydration  Nausea and vomiting, unspecified vomiting type    Rx / DC Orders ED Discharge Orders     None          Quintella Reichert, MD 05/10/22 [phone]

## 2022-05-10 NOTE — ED Notes (Signed)
Pt tolerated fluids well. Pt has not vomited at all.

## 2022-05-10 NOTE — ED Notes (Signed)
Asked patient for a urine sample, but patient states he is unable to provide one at the moment.

## 2022-05-11 ENCOUNTER — Encounter (HOSPITAL_COMMUNITY): Payer: Self-pay

## 2022-05-11 ENCOUNTER — Emergency Department (HOSPITAL_COMMUNITY)
Admission: EM | Admit: 2022-05-11 | Discharge: 2022-05-11 | Disposition: A | Discharge status: Home / Self Care | Payer: Self-pay | Attending: Emergency Medicine | Admitting: Emergency Medicine

## 2022-05-11 DIAGNOSIS — N189 Chronic kidney disease, unspecified: Secondary | ICD-10-CM | POA: Insufficient documentation

## 2022-05-11 DIAGNOSIS — F1290 Cannabis use, unspecified, uncomplicated: Secondary | ICD-10-CM

## 2022-05-11 DIAGNOSIS — R112 Nausea with vomiting, unspecified: Secondary | ICD-10-CM | POA: Insufficient documentation

## 2022-05-11 DIAGNOSIS — E1122 Type 2 diabetes mellitus with diabetic chronic kidney disease: Secondary | ICD-10-CM | POA: Insufficient documentation

## 2022-05-11 LAB — CBC WITH DIFFERENTIAL/PLATELET
Abs Immature Granulocytes: 0.03 10*3/uL (ref 0.00–0.07)
Basophils Absolute: 0 10*3/uL (ref 0.0–0.1)
Basophils Relative: 0 %
Eosinophils Absolute: 0 10*3/uL (ref 0.0–0.5)
Eosinophils Relative: 0 %
HCT: 45.4 % (ref 39.0–52.0)
Hemoglobin: 15.5 g/dL (ref 13.0–17.0)
Immature Granulocytes: 0 %
Lymphocytes Relative: 26 %
Lymphs Abs: 3.2 10*3/uL (ref 0.7–4.0)
MCH: 30.6 pg (ref 26.0–34.0)
MCHC: 34.1 g/dL (ref 30.0–36.0)
MCV: 89.5 fL (ref 80.0–100.0)
Monocytes Absolute: 1.1 10*3/uL — ABNORMAL HIGH (ref 0.1–1.0)
Monocytes Relative: 9 %
Neutro Abs: 8 10*3/uL — ABNORMAL HIGH (ref 1.7–7.7)
Neutrophils Relative %: 65 %
Platelets: 299 10*3/uL (ref 150–400)
RBC: 5.07 MIL/uL (ref 4.22–5.81)
RDW: 12.8 % (ref 11.5–15.5)
WBC: 12.4 10*3/uL — ABNORMAL HIGH (ref 4.0–10.5)
nRBC: 0 % (ref 0.0–0.2)

## 2022-05-11 LAB — URINALYSIS, ROUTINE W REFLEX MICROSCOPIC
Bacteria, UA: NONE SEEN
Bilirubin Urine: NEGATIVE
Glucose, UA: NEGATIVE mg/dL
Ketones, ur: 20 mg/dL — AB
Leukocytes,Ua: NEGATIVE
Nitrite: NEGATIVE
Protein, ur: 30 mg/dL — AB
Specific Gravity, Urine: 1.023 (ref 1.005–1.030)
pH: 5 (ref 5.0–8.0)

## 2022-05-11 LAB — RAPID URINE DRUG SCREEN, HOSP PERFORMED
Amphetamines: NOT DETECTED
Barbiturates: NOT DETECTED
Benzodiazepines: NOT DETECTED
Cocaine: NOT DETECTED
Opiates: NOT DETECTED
Tetrahydrocannabinol: POSITIVE — AB

## 2022-05-11 LAB — COMPREHENSIVE METABOLIC PANEL
ALT: 17 U/L (ref 0–44)
AST: 23 U/L (ref 15–41)
Albumin: 4.9 g/dL (ref 3.5–5.0)
Alkaline Phosphatase: 78 U/L (ref 38–126)
Anion gap: 13 (ref 5–15)
BUN: 32 mg/dL — ABNORMAL HIGH (ref 6–20)
CO2: 23 mmol/L (ref 22–32)
Calcium: 10 mg/dL (ref 8.9–10.3)
Chloride: 103 mmol/L (ref 98–111)
Creatinine, Ser: 1.17 mg/dL (ref 0.61–1.24)
GFR, Estimated: >60 mL/min (ref >60)
Glucose, Bld: 113 mg/dL — ABNORMAL HIGH (ref 70–99)
Potassium: 3.9 mmol/L (ref 3.5–5.1)
Sodium: 139 mmol/L (ref 135–145)
Total Bilirubin: 0.9 mg/dL (ref 0.3–1.2)
Total Protein: 9.2 g/dL — ABNORMAL HIGH (ref 6.5–8.1)

## 2022-05-11 LAB — LIPASE, BLOOD: Lipase: 26 U/L (ref 11–51)

## 2022-05-11 MED ORDER — ZIKS ARTHRITIS PAIN RELIEF 0.025-1-12 % EX CREA: 1.0000 | TOPICAL_CREAM | Freq: Two times a day (BID) | CUTANEOUS | 0 refills | Status: DC | PRN

## 2022-05-11 MED ORDER — ONDANSETRON HCL 4 MG/2ML IJ SOLN
4.0000 mg | Freq: Once | INTRAMUSCULAR | Status: AC
  Administered 2022-05-11: 4 mg via INTRAVENOUS
  Filled 2022-05-11: qty 2

## 2022-05-11 MED ORDER — FAMOTIDINE IN NACL 20-0.9 MG/50ML-% IV SOLN
20.0000 mg | Freq: Once | INTRAVENOUS | Status: AC
  Administered 2022-05-11: 20 mg via INTRAVENOUS
  Filled 2022-05-11: qty 50

## 2022-05-11 MED ORDER — SODIUM CHLORIDE 0.9 % IV BOLUS
1000.0000 mL | Freq: Once | INTRAVENOUS | Status: AC
  Administered 2022-05-11: 1000 mL via INTRAVENOUS

## 2022-05-11 MED ORDER — PROMETHAZINE HCL 25 MG RE SUPP: 25.0000 mg | Freq: Four times a day (QID) | RECTAL | 0 refills | Status: DC | PRN

## 2022-05-11 MED ORDER — LACTATED RINGERS IV BOLUS
1000.0000 mL | Freq: Once | INTRAVENOUS | Status: AC
Start: 2022-05-11 — End: 2022-05-11
  Administered 2022-05-11: 1000 mL via INTRAVENOUS

## 2022-05-11 NOTE — ED Provider Notes (Signed)
[facility] DEPT Provider Note   CSN: [id number] Arrival date & time: 05/11/22  0109     History  Chief Complaint  Patient presents with   Emesis    Lee Greer is a 56 y.o. male.  Patient as above with significant medical history as below, including cannabinoid hyperemesis, gastric ulcer, gastroparesis, malnutrition who presents to the ED with complaint of emesis. Pt was seen yesterday w/ similar complaint. Pt with ongoing recurrent n/v abdominal cramping over the past year, has seen GI in the past and was dx with gastric ulcer. Pt denies regular ETOH use or NSAID use, no BRBPR or melena. Deneis THC use. Denies hx of varices. No hematemesis. Pt felt better after discharge yesterday and was able to tolerate PO liquids but yesterday PM began to feel bad again. He has been taking hot showers which does help with the nausea. No suspicious PO intake, no fevers, no sick contacts or recent travel.    Pt denies THC use but does have hx of cannabinoid hyperemesis syndrome.   Past Medical History:  Diagnosis Date   Acute kidney injury ([place]) 04/13/2017   related to "dehydration" (04/14/2017)   Blood transfusion without reported diagnosis    Cannabinoid hyperemesis syndrome    Diabetes mellitus without complication ([place])    Esophageal tear    Gastric ulcer    Gastritis with intestinal metaplasia of stomach    Gastroparesis    H. pylori infection    Hypokalemia    Malnutrition of moderate degree ([place]: 60% to less than 75% of standard weight) (HCC)    Posthemorrhagic anemia    Upper GI bleed     Past Surgical History:  Procedure Laterality Date   BIOPSY  10/04/2018   Procedure: BIOPSY;  Surgeon: Jerene Bears, MD;  Location: [facility] ENDOSCOPY;  Service: Endoscopy;;   COLONOSCOPY  02/02/2019   Dr.Pyrtle   ESOPHAGOGASTRODUODENOSCOPY (EGD) WITH PROPOFOL N/A 09/27/2018   Procedure: ESOPHAGOGASTRODUODENOSCOPY (EGD) WITH PROPOFOL;  Surgeon: Juanita Craver, MD;   Location: [facility] ENDOSCOPY;  Service: Endoscopy;  Laterality: N/A;   ESOPHAGOGASTRODUODENOSCOPY (EGD) WITH PROPOFOL N/A 10/04/2018   Procedure: ESOPHAGOGASTRODUODENOSCOPY (EGD) WITH PROPOFOL;  Surgeon: Jerene Bears, MD;  Location: [facility] ENDOSCOPY;  Service: Endoscopy;  Laterality: N/A;   HOT HEMOSTASIS N/A 10/04/2018   Procedure: HOT HEMOSTASIS (ARGON PLASMA COAGULATION/BICAP);  Surgeon: Jerene Bears, MD;  Location: [facility] ENDOSCOPY;  Service: Endoscopy;  Laterality: N/A;   UPPER GASTROINTESTINAL ENDOSCOPY  02/02/2019   Dr.Pyrtle     The history is provided by the patient. No language interpreter was used.  Emesis Associated symptoms: abdominal pain   Associated symptoms: no chills, no cough, no fever and no headaches        Home Medications Prior to Admission medications   Medication Sig Start Date End Date Taking? Authorizing Provider  Capsaicin-Menthol-Methyl Sal (CAPSAICIN-METHYL SAL-MENTHOL) 0.025-1-12 % CREA Apply 1 Application topically 2 (two) times daily as needed. 05/11/22  Yes Jeanell Sparrow, DO  Multiple Vitamin (ONE-A-DAY MENS PO) Take 1 tablet by mouth daily.   Yes [provider]  promethazine (PHENERGAN) 25 MG suppository Place 1 suppository (25 mg total) rectally every 6 (six) hours as needed for nausea or vomiting. 05/11/22  Yes Jeanell Sparrow, DO      Allergies    Patient has no known allergies.    Review of Systems   Review of Systems  Constitutional:  Negative for chills and fever.  HENT:  Negative for facial swelling and  trouble swallowing.   Eyes:  Negative for photophobia and visual disturbance.  Respiratory:  Negative for cough and shortness of breath.   Cardiovascular:  Negative for chest pain and palpitations.  Gastrointestinal:  Positive for abdominal pain, nausea and vomiting.  Endocrine: Negative for polydipsia and polyuria.  Genitourinary:  Negative for difficulty urinating and hematuria.  Musculoskeletal:  Negative for gait problem and joint swelling.   Skin:  Negative for pallor and rash.  Neurological:  Negative for syncope and headaches.  Psychiatric/Behavioral:  Negative for agitation and confusion.     Physical Exam Updated Vital Signs BP (!) 165/109   Pulse 66   Temp 98.6 F (37 C) (Oral)   Resp 17   Ht 6' (1.829 m)   Wt 57.2 kg   SpO2 100%   BMI 17.09 kg/m  Physical Exam Vitals and nursing note reviewed. Exam conducted with a chaperone present.  Constitutional:      General: He is not in acute distress.    Appearance: Normal appearance. He is well-developed. He is not ill-appearing or diaphoretic.  HENT:     Head: Normocephalic and atraumatic.     Right Ear: External ear normal.     Left Ear: External ear normal.     Mouth/Throat:     Mouth: Mucous membranes are moist.  Eyes:     General: No scleral icterus. Cardiovascular:     Rate and Rhythm: Normal rate and regular rhythm.     Pulses: Normal pulses.     Heart sounds: Normal heart sounds.  Pulmonary:     Effort: Pulmonary effort is normal. No respiratory distress.     Breath sounds: Normal breath sounds.  Abdominal:     General: Abdomen is flat. There is no distension.     Palpations: Abdomen is soft.     Tenderness: There is no abdominal tenderness. There is no guarding or rebound.  Musculoskeletal:        General: Normal range of motion.     Cervical back: Normal range of motion.     Right lower leg: No edema.     Left lower leg: No edema.  Skin:    General: Skin is warm and dry.     Capillary Refill: Capillary refill takes less than 2 seconds.  Neurological:     Mental Status: He is alert and oriented to person, place, and time.     GCS: GCS eye subscore is 4. GCS verbal subscore is 5. GCS motor subscore is 6.  Psychiatric:        Mood and Affect: Mood normal.        Behavior: Behavior normal.     ED Results / Procedures / Treatments   Labs (all labs ordered are listed, but only abnormal results are displayed) Labs Reviewed  CBC WITH  DIFFERENTIAL/PLATELET - Abnormal; Notable for the following components:      Result Value   WBC 12.4 (*)    Neutro Abs 8.0 (*)    Monocytes Absolute 1.1 (*)    All other components within normal limits  COMPREHENSIVE METABOLIC PANEL - Abnormal; Notable for the following components:   Glucose, Bld 113 (*)    BUN 32 (*)    Total Protein 9.2 (*)    All other components within normal limits  URINALYSIS, ROUTINE W REFLEX MICROSCOPIC - Abnormal; Notable for the following components:   APPearance HAZY (*)    Hgb urine dipstick MODERATE (*)    Ketones, ur 20 (*)  Protein, ur 30 (*)    All other components within normal limits  RAPID URINE DRUG SCREEN, HOSP PERFORMED - Abnormal; Notable for the following components:   Tetrahydrocannabinol POSITIVE (*)    All other components within normal limits  LIPASE, BLOOD    EKG EKG Interpretation  Date/Time:  Tuesday May 11 2022 02:07:19 EDT Ventricular Rate:  81 PR Interval:  132 QRS Duration: 92 QT Interval:  407 QTC Calculation: 473 R Axis:   78 Text Interpretation: Sinus rhythm Right atrial enlargement Anteroseptal infarct, age indeterminate similar to prior no stemi Confirmed by Wynona Dove ([id number]) on 05/11/2022 2:23:59 AM  Radiology No results found.  Procedures Procedures    Medications Ordered in ED Medications  sodium chloride 0.9 % bolus 1,000 mL (0 mLs Intravenous Stopped 05/11/22 0330)  ondansetron (ZOFRAN) injection 4 mg (4 mg Intravenous Given 05/11/22 0205)  famotidine (PEPCID) IVPB 20 mg premix (0 mg Intravenous Stopped 05/11/22 0235)  lactated ringers bolus 1,000 mL (1,000 mLs Intravenous New Bag/Given 05/11/22 0329)    ED Course/ Medical Decision Making/ A&P Clinical Course as of 05/11/22 0658  Tue May 11, 2022  0651 Tetrahydrocannabinol(!): POSITIVE [SG]    Clinical Course User Index [SG] Jeanell Sparrow, DO                           Medical Decision Making Amount and/or Complexity of Data Reviewed Labs:  ordered. Decision-making details documented in ED Course. ECG/medicine tests: ordered.  Risk OTC drugs. Prescription drug management.   This patient presents to the ED with chief complaint(s) of n/v/abd cramping occ with pertinent past medical history of recurrent n/v, gastric ulcer, thc use which further complicates the presenting complaint. The complaint involves an extensive differential diagnosis and also carries with it a high risk of complications and morbidity.     Differential diagnosis includes but is not exclusive to acute cholecystitis, intrathoracic causes for epigastric abdominal pain, gastritis, duodenitis, pancreatitis, small bowel or large bowel obstruction, abdominal aortic aneurysm, hernia, gastritis, cyclical vomiting, etc.  . Serious etiologies were considered.   The initial plan is to screening labs, IVF, antiemetic, pepcid   Additional history obtained: Additional history obtained from  na Records reviewed  prior ED visits, prior labs/imaging/home meds >> UDS 1/19 +thc, ; admitted at that time 2/2 likely cannabinoid hyperemesis   Independent labs interpretation:  The following labs were independently interpreted:  UDS +tive for THC UA w/ ketones, no evidence of infection WBC mildly elevated, favor reactive in setting of emesis; does not appear to be septic CMP with elevated BUN, Cr is stable, favor mild dehydration  Independent visualization of imaging: Cardiac monitoring was reviewed and interpreted by myself which shows NSR  Treatment and Reassessment: IVF GI cocktail >> improved, tolerating PO intake w/o difficulty, no further emesis   Consultation: - Consulted or discussed management/test interpretation w/ external professional: n/a  Consideration for admission or further workup: Admission was considered   UDS with THC, recommend pt avoid THC in the future, give capsaicin cream and phenergan suppository for home. He has PO zofran as needed.  Recommend bland diet, f/u with pcp. F/u GI. Pt tolerating PO and feeling much better at time of discharge. Rpt abd exam is soft, non-tender, not-peritoneal.    The patient improved significantly and was discharged in stable condition. Detailed discussions were had with the patient regarding current findings, and need for close f/u with PCP or on call doctor. The patient  has been instructed to return immediately if the symptoms worsen in any way for re-evaluation. Patient verbalized understanding and is in agreement with current care plan. All questions answered prior to discharge.    Social Determinants of health: Social History   Tobacco Use   Smoking status: Never   Smokeless tobacco: Never  Vaping Use   Vaping Use: Never used  Substance Use Topics   Alcohol use: Not Currently    Comment: 04/14/2017 "sober since 03/2008"   Drug use: Not Currently    Types: Marijuana    Comment: last used 1 month ago            Final Clinical Impression(s) / ED Diagnoses Final diagnoses:  Nausea and vomiting, unspecified vomiting type  Cannabinoid hyperemesis syndrome    Rx / DC Orders ED Discharge Orders          Ordered    Capsaicin-Menthol-Methyl Sal (CAPSAICIN-METHYL SAL-MENTHOL) 0.025-1-12 % CREA  2 times daily PRN        05/11/22 0652    promethazine (PHENERGAN) 25 MG suppository  Every 6 hours PRN        05/11/22 [place], [place] A, DO 05/11/22 [phone]

## 2022-05-11 NOTE — ED Triage Notes (Signed)
Patient states he was seen yesterday for NVD, given fluids and nausea medication. Was told to return to ED if vomiting continues. States he has been unable to hold down any fluids since leaving yesterday.

## 2022-05-11 NOTE — ED Notes (Signed)
Pt has finally urinated and urine sent to lab. Provider advised of same

## 2022-05-11 NOTE — Discharge Instructions (Signed)
Please refrain from THC/marijuana/cannabis use moving forward  It was a pleasure caring for you today in the emergency department.  Please return to the emergency department for any worsening or worrisome symptoms.

## 2022-05-11 NOTE — ED Notes (Signed)
Fluid challenge done. Pt said he does not feel like he is going to be sick.

## 2022-05-11 NOTE — ED Notes (Signed)
Pt is made aware that we need urin. Pt advised he cannot produce b/c he is so dehydrated. Pt advised he would when he could. Provider is notified of same.

## 2022-05-12 ENCOUNTER — Telehealth: Payer: Self-pay | Admitting: Internal Medicine

## 2022-05-12 NOTE — Telephone Encounter (Signed)
Called pt to verify insurance info and he started to tell me about a problem he experienced this weekend. Pt states that he had 2 large bowel movements  which left him completely emptied. States that he felt dehydrated and was on liquids all weekend.  Would like to discuss further with nurse.

## 2022-05-12 NOTE — Telephone Encounter (Signed)
Pt wanted to let us know he had 2 large BM's Friday and felt dehydrated afterwards. Pt states he was seen in the ER for IV fluids 2 days in a row. He states he feels better now but wanted to make sure we were aware. Discussed with pt that he could try liquid IV that was available over the counter to help with hydration. He is scheduled for procedure on 9/18 and worried about doing the prep and the same thing happening. Discussed with him he needs to drink plenty of fluids with the prep such as gatorade and liquid IV. Pt verbalized understanding.

## 2022-05-24 ENCOUNTER — Ambulatory Visit (AMBULATORY_SURGERY_CENTER): Payer: Self-pay | Admitting: Internal Medicine

## 2022-05-24 ENCOUNTER — Encounter: Payer: Self-pay | Admitting: Internal Medicine

## 2022-05-24 VITALS — BP 154/78 | HR 77 | Temp 98.2°F | Resp 16 | Ht 73.0 in | Wt 138.0 lb

## 2022-05-24 DIAGNOSIS — Z09 Encounter for follow-up examination after completed treatment for conditions other than malignant neoplasm: Secondary | ICD-10-CM

## 2022-05-24 DIAGNOSIS — K2970 Gastritis, unspecified, without bleeding: Secondary | ICD-10-CM

## 2022-05-24 DIAGNOSIS — Z86010 Personal history of colonic polyps: Secondary | ICD-10-CM

## 2022-05-24 DIAGNOSIS — K31A0 Gastric intestinal metaplasia, unspecified: Secondary | ICD-10-CM

## 2022-05-24 DIAGNOSIS — K31A12 Gastric intestinal metaplasia without dysplasia, involving the body (corpus): Secondary | ICD-10-CM

## 2022-05-24 DIAGNOSIS — Z8711 Personal history of peptic ulcer disease: Secondary | ICD-10-CM

## 2022-05-24 DIAGNOSIS — D122 Benign neoplasm of ascending colon: Secondary | ICD-10-CM

## 2022-05-24 DIAGNOSIS — K2950 Unspecified chronic gastritis without bleeding: Secondary | ICD-10-CM

## 2022-05-24 DIAGNOSIS — D120 Benign neoplasm of cecum: Secondary | ICD-10-CM

## 2022-05-24 MED ORDER — PANTOPRAZOLE SODIUM 40 MG PO TBEC: 40.0000 mg | DELAYED_RELEASE_TABLET | Freq: Two times a day (BID) | ORAL | 0 refills | Status: DC

## 2022-05-24 MED ORDER — SODIUM CHLORIDE 0.9 % IV SOLN: 500.0000 mL | Freq: Once | INTRAVENOUS | Status: DC

## 2022-05-24 MED ORDER — SUCRALFATE 1 G PO TABS: 1.0000 g | ORAL_TABLET | Freq: Three times a day (TID) | ORAL | 0 refills | Status: DC

## 2022-05-24 NOTE — Progress Notes (Signed)
A/ox3, pleased with MAC, report to RN 

## 2022-05-24 NOTE — Op Note (Signed)
[facility] Patient Name: Lee Greer Procedure Date: 05/24/2022 10:43 AM MRN: [id number] Endoscopist: Jerene Bears , MD Age: 56 Referring MD:  Date of Birth: September 22, 1965 Gender: Male Account #: [id number] Procedure:                Colonoscopy Indications:              High risk colon cancer surveillance: Personal                            history of multiple adenomas, Last colonoscopy: May                            2020 (3 adenomas removed) Medicines:                Monitored Anesthesia Care Procedure:                Pre-Anesthesia Assessment:                           - Prior to the procedure, a History and Physical                            was performed, and patient medications and                            allergies were reviewed. The patient's tolerance of                            previous anesthesia was also reviewed. The risks                            and benefits of the procedure and the sedation                            options and risks were discussed with the patient.                            All questions were answered, and informed consent                            was obtained. Prior Anticoagulants: The patient has                            taken no previous anticoagulant or antiplatelet                            agents. ASA Grade Assessment: II - A patient with                            mild systemic disease. After reviewing the risks                            and benefits, the patient was deemed in  satisfactory condition to undergo the procedure.                           After obtaining informed consent, the colonoscope                            was passed under direct vision. Throughout the                            procedure, the patient's blood pressure, pulse, and                            oxygen saturations were monitored continuously. The                            CF [id number] #[id number] was introduced  through the anus                            and advanced to the cecum, identified by                            appendiceal orifice and ileocecal valve. The                            colonoscopy was performed without difficulty. The                            patient tolerated the procedure well. The quality                            of the bowel preparation was good. The ileocecal                            valve, appendiceal orifice, and rectum were                            photographed. Scope In: 11:01:21 AM Scope Out: 11:16:35 AM Scope Withdrawal Time: 0 hours 13 minutes 27 seconds  Total Procedure Duration: 0 hours 15 minutes 14 seconds  Findings:                 External hemorrhoids were found on perianal exam.                           A 5 mm polyp was found in the cecum. The polyp was                            sessile. The polyp was removed with a cold snare.                            Resection and retrieval were complete.                           Three sessile polyps were found in the ascending  colon. The polyps were 3 to 4 mm in size. These                            polyps were removed with a cold snare. Resection                            and retrieval were complete.                           The exam was otherwise without abnormality on                            direct and retroflexion views. Complications:            No immediate complications. Estimated Blood Loss:     Estimated blood loss was minimal. Impression:               - External hemorrhoids found on perianal exam.                           - One 5 mm polyp in the cecum, removed with a cold                            snare. Resected and retrieved.                           - Three 3 to 4 mm polyps in the ascending colon,                            removed with a cold snare. Resected and retrieved.                           - The examination was otherwise normal on direct                             and retroflexion views. Recommendation:           - Patient has a contact number available for                            emergencies. The signs and symptoms of potential                            delayed complications were discussed with the                            patient. Return to normal activities tomorrow.                            Written discharge instructions were provided to the                            patient.                           -  Resume previous diet.                           - Continue present medications.                           - Await pathology results.                           - Repeat colonoscopy is recommended for                            surveillance. The colonoscopy date will be                            determined after pathology results from today's                            exam become available for review. Jerene Bears, MD 05/24/2022 11:27:34 AM This report has been signed electronically.

## 2022-05-24 NOTE — Progress Notes (Signed)
GASTROENTEROLOGY PROCEDURE H&P NOTE   Primary Care Physician: Gildardo Pounds, NP    Reason for Procedure:  Follow-up chronic gastritis with intestinal metaplasia and history of bleeding gastric ulcer, history of colon polyps  Plan:    Upper and lower endoscopy  Patient is appropriate for endoscopic procedure(s) in the ambulatory ([place]) setting.  The nature of the procedure, as well as the risks, benefits, and alternatives were carefully and thoroughly reviewed with the patient. Ample time for discussion and questions allowed. The patient understood, was satisfied, and agreed to proceed.     HPI: Lee Greer is a 56 y.o. male who presents for EGD and colonoscopy.  Medical history as below.  Tolerated the prep.  No recent chest pain or shortness of breath.  No abdominal pain today.  Past Medical History:  Diagnosis Date   Acute kidney injury ([place]) 04/13/2017   related to "dehydration" (04/14/2017)   Blood transfusion without reported diagnosis    Cannabinoid hyperemesis syndrome    Diabetes mellitus without complication ([place])    Esophageal tear    Gastric ulcer    Gastritis with intestinal metaplasia of stomach    Gastroparesis    H. pylori infection    Hypokalemia    Malnutrition of moderate degree ([place]: 60% to less than 75% of standard weight) (H[facility])    Posthemorrhagic anemia    Upper GI bleed     Past Surgical History:  Procedure Laterality Date   BIOPSY  10/04/2018   Procedure: BIOPSY;  Surgeon: Jerene Bears, MD;  Location: [facility];  Service: Endoscopy;;   COLONOSCOPY  02/02/2019   Dr.Kolbi Tofte   ESOPHAGOGASTRODUODENOSCOPY (EGD) WITH PROPOFOL N/A 09/27/2018   Procedure: ESOPHAGOGASTRODUODENOSCOPY (EGD) WITH PROPOFOL;  Surgeon: Juanita Craver, MD;  Location: [facility];  Service: Endoscopy;  Laterality: N/A;   ESOPHAGOGASTRODUODENOSCOPY (EGD) WITH PROPOFOL N/A 10/04/2018   Procedure: ESOPHAGOGASTRODUODENOSCOPY (EGD) WITH PROPOFOL;  Surgeon: Jerene Bears,  MD;  Location: [facility];  Service: Endoscopy;  Laterality: N/A;   HOT HEMOSTASIS N/A 10/04/2018   Procedure: HOT HEMOSTASIS (ARGON PLASMA COAGULATION/BICAP);  Surgeon: Jerene Bears, MD;  Location: [facility];  Service: Endoscopy;  Laterality: N/A;   UPPER GASTROINTESTINAL ENDOSCOPY  02/02/2019   Dr.Tharon Bomar    Prior to Admission medications   Medication Sig Start Date End Date Taking? Authorizing Provider  Multiple Vitamin (ONE-A-DAY MENS PO) Take 1 tablet by mouth daily.   Yes [provider]  Capsaicin-Menthol-Methyl Sal (CAPSAICIN-METHYL SAL-MENTHOL) 0.025-1-12 % CREA Apply 1 Application topically 2 (two) times daily as needed. 05/11/22   Jeanell Sparrow, DO  promethazine (PHENERGAN) 25 MG suppository Place 1 suppository (25 mg total) rectally every 6 (six) hours as needed for nausea or vomiting. 05/11/22   Jeanell Sparrow, DO    Current Outpatient Medications  Medication Sig Dispense Refill   Multiple Vitamin (ONE-A-DAY MENS PO) Take 1 tablet by mouth daily.     Capsaicin-Menthol-Methyl Sal (CAPSAICIN-METHYL SAL-MENTHOL) 0.025-1-12 % CREA Apply 1 Application topically 2 (two) times daily as needed. 56.6 g 0   promethazine (PHENERGAN) 25 MG suppository Place 1 suppository (25 mg total) rectally every 6 (six) hours as needed for nausea or vomiting. 8 each 0   Current Facility-Administered Medications  Medication Dose Route Frequency Provider Last Rate Last Admin   0.9 %  sodium chloride infusion  500 mL Intravenous Once Danaiya Steadman, Lajuan Lines, MD        Allergies as of 05/24/2022   (No Known Allergies)  Family History  Problem Relation Age of Onset   CAD Mother    Diabetes Neg Hx    Stroke Neg Hx    Colon cancer Neg Hx    Esophageal cancer Neg Hx    Ulcerative colitis Neg Hx    Stomach cancer Neg Hx     Social History   Socioeconomic History   Marital status: Single    Spouse name: Not on file   Number of children: Not on file   Years of education: Not on file    Highest education level: Not on file  Occupational History   Not on file  Tobacco Use   Smoking status: Never   Smokeless tobacco: Never  Vaping Use   Vaping Use: Never used  Substance and Sexual Activity   Alcohol use: Not Currently    Comment: 04/14/2017 "sober since 03/2008"   Drug use: Not Currently    Types: Marijuana    Comment: last used 05/23/22   Sexual activity: Yes  Other Topics Concern   Not on file  Social History Narrative   Not on file   Social Determinants of Health   Financial Resource Strain: Not on file  Food Insecurity: Not on file  Transportation Needs: Not on file  Physical Activity: Not on file  Stress: Not on file  Social Connections: Not on file  Intimate Partner Violence: Not on file    Physical Exam: Vital signs in last 24 hours: '@BP'$  (!) 84/49   Pulse 67   Temp 98.2 F (36.8 C)   Ht '6\' 1"'$  (1.854 m)   Wt 138 lb (62.6 kg)   SpO2 100%   BMI 18.21 kg/m  GEN: NAD EYE: Sclerae anicteric ENT: MMM CV: Non-tachycardic Pulm: CTA b/l GI: Soft, NT/ND NEURO:  Alert & Oriented x 3   Zenovia Jarred, MD [facility]  05/24/2022 10:43 AM

## 2022-05-24 NOTE — Progress Notes (Signed)
Pt's states no medical or surgical changes since previsit or office visit. 

## 2022-05-24 NOTE — Patient Instructions (Signed)
Resume previous medications.  4 polyps removed and sent to pathology.  Await results for final recommendations.    Handouts on findings given to patient.   (Polyps, hemorrhoids, gastritis)  YOU HAD AN ENDOSCOPIC PROCEDURE TODAY AT [facility]:   Refer to the procedure report that was given to you for any specific questions about what was found during the examination.  If the procedure report does not answer your questions, please call your gastroenterologist to clarify.  If you requested that your care partner not be given the details of your procedure findings, then the procedure report has been included in a sealed envelope for you to review at your convenience later.  YOU SHOULD EXPECT: Some feelings of bloating in the abdomen. Passage of more gas than usual.  Walking can help get rid of the air that was put into your GI tract during the procedure and reduce the bloating. If you had a lower endoscopy (such as a colonoscopy or flexible sigmoidoscopy) you may notice spotting of blood in your stool or on the toilet paper. If you underwent a bowel prep for your procedure, you may not have a normal bowel movement for a few days.  Please Note:  You might notice some irritation and congestion in your nose or some drainage.  This is from the oxygen used during your procedure.  There is no need for concern and it should clear up in a day or so.  SYMPTOMS TO REPORT IMMEDIATELY:  Following lower endoscopy (colonoscopy or flexible sigmoidoscopy):  Excessive amounts of blood in the stool  Significant tenderness or worsening of abdominal pains  Swelling of the abdomen that is new, acute  Fever of 100F or higher  Following upper endoscopy (EGD)  Vomiting of blood or coffee ground material  New chest pain or pain under the shoulder blades  Painful or persistently difficult swallowing  New shortness of breath  Fever of 100F or higher  Black, tarry-looking stools  For urgent or emergent  issues, a gastroenterologist can be reached at any hour by calling [phone]. Do not use MyChart messaging for urgent concerns.    DIET:  We do recommend a small meal at first, but then you may proceed to your regular diet.  Drink plenty of fluids but you should avoid alcoholic beverages for 24 hours.  ACTIVITY:  You should plan to take it easy for the rest of today and you should NOT DRIVE or use heavy machinery until tomorrow (because of the sedation medicines used during the test).    FOLLOW UP: Our staff will call the number listed on your records the next business day following your procedure.  We will call around 7:15- 8:00 am to check on you and address any questions or concerns that you may have regarding the information given to you following your procedure. If we do not reach you, we will leave a message.     If any biopsies were taken you will be contacted by phone or by letter within the next 1-3 weeks.  Please call us at [phone] if you have not heard about the biopsies in 3 weeks.    SIGNATURES/CONFIDENTIALITY: You and/or your care partner have signed paperwork which will be entered into your electronic medical record.  These signatures attest to the fact that that the information above on your After Visit Summary has been reviewed and is understood.  Full responsibility of the confidentiality of this discharge information lies with you and/or your  care-partner.  

## 2022-05-24 NOTE — Progress Notes (Signed)
Called to room to assist during endoscopic procedure.  Patient ID and intended procedure confirmed with present staff. Received instructions for my participation in the procedure from the performing physician.  

## 2022-05-24 NOTE — Progress Notes (Signed)
Pt voided 500cc of pale yellow urine.

## 2022-05-24 NOTE — Op Note (Signed)
[name] Procedure Date: 05/24/2022 10:43 AM MRN: [id number] Endoscopist: Jerene Bears , MD Age: 56 Referring MD:  Date of Birth: 09-19-1965 Gender: Male Account #: [id number] Procedure:                Upper GI endoscopy Indications:              Follow-up of chronic gastritis with intestinal                            metaplasia without dysplasia, personal hx of                            bleeding gastric ulcer in 2020 Medicines:                Monitored Anesthesia Care Procedure:                Pre-Anesthesia Assessment:                           - Prior to the procedure, a History and Physical                            was performed, and patient medications and                            allergies were reviewed. The patient's tolerance of                            previous anesthesia was also reviewed. The risks                            and benefits of the procedure and the sedation                            options and risks were discussed with the patient.                            All questions were answered, and informed consent                            was obtained. Prior Anticoagulants: The patient has                            taken no previous anticoagulant or antiplatelet                            agents. ASA Grade Assessment: II - A patient with                            mild systemic disease. After reviewing the risks                            and benefits, the patient was deemed in  satisfactory condition to undergo the procedure.                           After obtaining informed consent, the endoscope was                            passed under direct vision. Throughout the                            procedure, the patient's blood pressure, pulse, and                            oxygen saturations were monitored continuously. The                            Endoscope was introduced through  the mouth, and                            advanced to the second part of duodenum. The upper                            GI endoscopy was accomplished without difficulty.                            The patient tolerated the procedure well. Scope In: Scope Out: Findings:                 The examined esophagus was normal.                           Diffuse moderate mucosal changes characterized by                            granularity and flattening was found in the gastric                            body and in the gastric antrum. Multiple biopsies                            were obtained with cold forceps for histology and                            Helicobacter pylori testing in the cardia/gastric                            fundus (Jar 3), in the gastric body (Jar 2), at the                            incisura/gastric antrum (Jar 1). No erosions or                            ulcerations.                           The examined  duodenum was normal. Complications:            No immediate complications. Estimated Blood Loss:     Estimated blood loss was minimal. Impression:               - Normal esophagus.                           - Chronic gastritis. Biopsies were obtained in the                            cardia, in the gastric fundus, in the gastric body,                            at the incisura and in the gastric antrum to                            exclude dysplasia and for IHC H. Pylori testing.                           - No ulcers.                           - Normal examined duodenum. Recommendation:           - Patient has a contact number available for                            emergencies. The signs and symptoms of potential                            delayed complications were discussed with the                            patient. Return to normal activities tomorrow.                            Written discharge instructions were provided to the                             patient.                           - Resume previous diet.                           - Continue present medications.                           - Await pathology results.                           - See the other procedure note for documentation of                            additional recommendations. Jerene Bears, MD 05/24/2022 11:25:24 AM This report has been signed electronically.

## 2022-05-24 NOTE — Progress Notes (Signed)
Pt arrived to recovery nauseated.  Zofran '4mg'$  given IV by anesthesia.

## 2022-05-25 ENCOUNTER — Telehealth: Payer: Self-pay | Admitting: *Deleted

## 2022-05-25 NOTE — Telephone Encounter (Signed)
Attempt for follow up phone call. No answer at number given.  Left message on voicemail.   

## 2022-05-27 ENCOUNTER — Emergency Department (HOSPITAL_COMMUNITY)
Admission: EM | Admit: 2022-05-27 | Discharge: 2022-05-27 | Disposition: A | Discharge status: Home / Self Care | Payer: Self-pay | Attending: Emergency Medicine | Admitting: Emergency Medicine

## 2022-05-27 ENCOUNTER — Encounter (HOSPITAL_COMMUNITY): Payer: Self-pay | Admitting: Emergency Medicine

## 2022-05-27 ENCOUNTER — Emergency Department (HOSPITAL_COMMUNITY): Payer: Self-pay

## 2022-05-27 DIAGNOSIS — R112 Nausea with vomiting, unspecified: Secondary | ICD-10-CM | POA: Insufficient documentation

## 2022-05-27 DIAGNOSIS — E119 Type 2 diabetes mellitus without complications: Secondary | ICD-10-CM | POA: Insufficient documentation

## 2022-05-27 LAB — COMPREHENSIVE METABOLIC PANEL
ALT: 20 U/L (ref 0–44)
AST: 19 U/L (ref 15–41)
Albumin: 4.8 g/dL (ref 3.5–5.0)
Alkaline Phosphatase: 73 U/L (ref 38–126)
Anion gap: 11 (ref 5–15)
BUN: 38 mg/dL — ABNORMAL HIGH (ref 6–20)
CO2: 22 mmol/L (ref 22–32)
Calcium: 9.6 mg/dL (ref 8.9–10.3)
Chloride: 106 mmol/L (ref 98–111)
Creatinine, Ser: 1.07 mg/dL (ref 0.61–1.24)
GFR, Estimated: >60 mL/min (ref >60)
Glucose, Bld: 120 mg/dL — ABNORMAL HIGH (ref 70–99)
Potassium: 3.7 mmol/L (ref 3.5–5.1)
Sodium: 139 mmol/L (ref 135–145)
Total Bilirubin: 1 mg/dL (ref 0.3–1.2)
Total Protein: 8.4 g/dL — ABNORMAL HIGH (ref 6.5–8.1)

## 2022-05-27 LAB — CBC WITH DIFFERENTIAL/PLATELET
Abs Immature Granulocytes: 0.02 10*3/uL (ref 0.00–0.07)
Basophils Absolute: 0 10*3/uL (ref 0.0–0.1)
Basophils Relative: 0 %
Eosinophils Absolute: 0 10*3/uL (ref 0.0–0.5)
Eosinophils Relative: 0 %
HCT: 43.5 % (ref 39.0–52.0)
Hemoglobin: 15 g/dL (ref 13.0–17.0)
Immature Granulocytes: 0 %
Lymphocytes Relative: 23 %
Lymphs Abs: 2 10*3/uL (ref 0.7–4.0)
MCH: 30.9 pg (ref 26.0–34.0)
MCHC: 34.5 g/dL (ref 30.0–36.0)
MCV: 89.7 fL (ref 80.0–100.0)
Monocytes Absolute: 0.4 10*3/uL (ref 0.1–1.0)
Monocytes Relative: 5 %
Neutro Abs: 6.3 10*3/uL (ref 1.7–7.7)
Neutrophils Relative %: 72 %
Platelets: 320 10*3/uL (ref 150–400)
RBC: 4.85 MIL/uL (ref 4.22–5.81)
RDW: 12.7 % (ref 11.5–15.5)
WBC: 8.7 10*3/uL (ref 4.0–10.5)
nRBC: 0 % (ref 0.0–0.2)

## 2022-05-27 LAB — LACTIC ACID, PLASMA: Lactic Acid, Venous: 1.1 mmol/L (ref 0.5–1.9)

## 2022-05-27 LAB — LIPASE, BLOOD: Lipase: 23 U/L (ref 11–51)

## 2022-05-27 MED ORDER — SODIUM CHLORIDE (PF) 0.9 % IJ SOLN
INTRAMUSCULAR | Status: AC
  Filled 2022-05-27: qty 50

## 2022-05-27 MED ORDER — METOCLOPRAMIDE HCL 5 MG/ML IJ SOLN
10.0000 mg | Freq: Once | INTRAMUSCULAR | Status: AC
  Administered 2022-05-27: 10 mg via INTRAVENOUS
  Filled 2022-05-27: qty 2

## 2022-05-27 MED ORDER — DROPERIDOL 2.5 MG/ML IJ SOLN
1.2500 mg | Freq: Once | INTRAMUSCULAR | Status: AC
  Administered 2022-05-27: 1.25 mg via INTRAVENOUS
  Filled 2022-05-27: qty 2

## 2022-05-27 MED ORDER — SODIUM CHLORIDE 0.9 % IV BOLUS
1000.0000 mL | Freq: Once | INTRAVENOUS | Status: AC
  Administered 2022-05-27: 1000 mL via INTRAVENOUS

## 2022-05-27 MED ORDER — PROMETHAZINE HCL 25 MG RE SUPP: 25.0000 mg | Freq: Four times a day (QID) | RECTAL | 0 refills | Status: DC | PRN

## 2022-05-27 MED ORDER — IOHEXOL 300 MG/ML  SOLN
100.0000 mL | Freq: Once | INTRAMUSCULAR | Status: AC | PRN
  Administered 2022-05-27: 100 mL via INTRAVENOUS

## 2022-05-27 NOTE — Discharge Instructions (Signed)
You have been seen today for your complaint of nausea and vomiting. Your lab work was reassuring and showed no abnormalities. Your imaging was reassuring and showed no abnormalities. Your discharge medications include Phenergan.  This is a nausea medication.  You should take it as needed for nausea.  You should not take your Zofran while you are taking this. Home care instructions are as follows:  You should slowly progress to a normal diet.  You should start with clear liquids.  You should drink plenty of water. Follow up with: Your primary care provider in 1 week Please seek immediate medical care if you develop any of the following symptoms: You have pain in your chest, neck, arm, or jaw. You feel very weak or you faint. You vomit again and again. You have vomit that is bright red or looks like black coffee grounds. You have bloody or black poop (stools) or poop that looks like tar. You have a very bad headache, a stiff neck, or both. You have very bad pain, cramping, or bloating in your belly (abdomen). You have trouble breathing. You are breathing very quickly. Your heart is beating very quickly. Your skin feels cold and clammy. You feel confused. You have signs of losing too much water in your body, such as: Dark pee, very little pee, or no pee. Cracked lips. Dry mouth. Sunken eyes. Sleepiness. Weakness. At this time there does not appear to be the presence of an emergent medical condition, however there is always the potential for conditions to change. Please read and follow the below instructions.  Do not take your medicine if  develop an itchy rash, swelling in your mouth or lips, or difficulty breathing; call 911 and seek immediate emergency medical attention if this occurs.  You may review your lab tests and imaging results in their entirety on your MyChart account.  Please discuss all results of fully with your primary care provider and other specialist at your follow-up  visit.  Note: Portions of this text may have been transcribed using voice recognition software. Every effort was made to ensure accuracy; however, inadvertent computerized transcription errors may still be present.

## 2022-05-27 NOTE — ED Notes (Signed)
Pt nausea vomit x1 at this time.

## 2022-05-27 NOTE — ED Notes (Signed)
Pt given a pack of graham crackers.

## 2022-05-27 NOTE — ED Provider Notes (Signed)
[facility] DEPT Provider Note   CSN: [id number] Arrival date & time: 05/27/22  1696     History  Chief Complaint  Patient presents with   Nausea    Lee Greer is a 56 y.o. male.  Who presents to the ED for evaluation of nausea.  He reported having a colonoscopy and upper endoscopy on Tuesday.  He woke up with nausea that was treated with IV Zofran and fluids.  He then went home and developed more nausea.  He tried taking Zofran ODT at home, but it did not resolve his nausea.  He has not been able to eat or drink since Wednesday morning.  States that this happened again when he had his last colonoscopy a few years ago.  He was treated with IV fluids and a different nausea medication at that time.  On chart review, patient presents often with nausea and vomiting, does have a history of cannabis hyperemesis.  Was seen here on 05/11/2022 for this, and since a prescription for promethazine.  Patient states he never picked this medication up.  Currently denying fevers, chills, abdominal pain, chest pain, shortness of breath, diarrhea, hematemesis, melena, hematochezia.  HPI     Home Medications Prior to Admission medications   Medication Sig Start Date End Date Taking? Authorizing Provider  Capsaicin-Menthol-Methyl Sal (CAPSAICIN-METHYL SAL-MENTHOL) 0.025-1-12 % CREA Apply 1 Application topically 2 (two) times daily as needed. 05/11/22   Jeanell Sparrow, DO  Multiple Vitamin (ONE-A-DAY MENS PO) Take 1 tablet by mouth daily.    [provider]  pantoprazole (PROTONIX) 40 MG tablet Take 1 tablet (40 mg total) by mouth 2 (two) times daily. 05/24/22   Pyrtle, Lajuan Lines, MD  promethazine (PHENERGAN) 25 MG suppository Place 1 suppository (25 mg total) rectally every 6 (six) hours as needed for nausea or vomiting. 05/11/22   Jeanell Sparrow, DO  sucralfate (CARAFATE) 1 g tablet Take 1 tablet (1 g total) by mouth 4 (four) times daily -  with meals and at bedtime.  05/24/22   Pyrtle, Lajuan Lines, MD      Allergies    Patient has no known allergies.    Review of Systems   Review of Systems  Gastrointestinal:  Positive for nausea and vomiting.  All other systems reviewed and are negative.   Physical Exam Updated Vital Signs BP (!) 154/112   Pulse 78   Temp 98.3 F (36.8 C) (Oral)   Resp 18   Ht '6\' 1"'$  (1.854 m)   Wt 62.6 kg   SpO2 99%   BMI 18.21 kg/m  Physical Exam Vitals and nursing note reviewed.  Constitutional:      General: He is not in acute distress.    Appearance: Normal appearance. He is normal weight. He is not ill-appearing or toxic-appearing.  HENT:     Head: Normocephalic and atraumatic.  Cardiovascular:     Rate and Rhythm: Normal rate and regular rhythm.     Pulses: Normal pulses.     Heart sounds: Normal heart sounds.  Pulmonary:     Effort: Pulmonary effort is normal. No respiratory distress.     Breath sounds: Normal breath sounds.  Abdominal:     General: Abdomen is flat. Bowel sounds are normal.     Palpations: Abdomen is soft.     Tenderness: There is no abdominal tenderness. There is no guarding. Negative signs include Murphy's sign, Rovsing's sign, McBurney's sign, psoas sign and obturator sign.  Musculoskeletal:  General: Normal range of motion.     Cervical back: Neck supple.  Skin:    General: Skin is warm and dry.     Capillary Refill: Capillary refill takes less than 2 seconds.  Neurological:     Mental Status: He is alert and oriented to person, place, and time.  Psychiatric:        Mood and Affect: Mood normal.        Behavior: Behavior normal.     ED Results / Procedures / Treatments   Labs (all labs ordered are listed, but only abnormal results are displayed) Labs Reviewed  COMPREHENSIVE METABOLIC PANEL - Abnormal; Notable for the following components:      Result Value   Glucose, Bld 120 (*)    BUN 38 (*)    Total Protein 8.4 (*)    All other components within normal limits  LIPASE,  BLOOD  CBC WITH DIFFERENTIAL/PLATELET    EKG None  Radiology No results found.  Procedures Procedures    Medications Ordered in ED Medications  sodium chloride 0.9 % bolus 1,000 mL (1,000 mLs Intravenous New Bag/Given 05/27/22 0732)  metoCLOPramide (REGLAN) injection 10 mg (10 mg Intravenous Given 05/27/22 0731)    ED Course/ Medical Decision Making/ A&P Clinical Course as of 05/27/22 0919  Thu May 27, 2022  0818 After reevaluation patient states that he has no more nausea, he is willing to try p.o. challenge [AS]    Clinical Course User Index [AS] Claudie Fisherman Grafton Folk, PA-C                           Medical Decision Making Amount and/or Complexity of Data Reviewed Labs: ordered. Radiology: ordered.  Risk Prescription drug management.  This patient presents to the ED for concern of nausea and vomiting, this involves an extensive number of treatment options, and is a complaint that carries with it a high risk of complications and morbidity.  The differential diagnosis includes, but is not limited to AAA, gastroenteritis, appendicitis, Bowel obstruction, Bowel perforation. Gastroparesis, DKA, Hernia, Inflammatory bowel disease, mesenteric ischemia, pancreatitis, peritonitis SBP, volvulus, cannabis hyperemesis   Co morbidities that complicate the patient evaluation  Cannabis hyperemesis, history of gastric ulcers, gastritis, gastroparesis, diabetes  My initial workup includes basic labs, IV fluids and Reglan  Additional history obtained from: Nursing notes from this visit. Prior ED visit on 05/11/2022 and 12/25/2021 for similar symptoms. Previous records within EMR system progress notes from 05/24/2022 for upper endoscopy and colonoscopy.  Patient has gastritis without ulcers, did have a few polyps that were removed.  I ordered, reviewed and interpreted labs which include: CBC, CMP, lipase.  BUN elevated at 38.  All other labs within normal limits  I ordered imaging  including CT abdomen pelvis with contrast.  That showed no acute intra-abdominal abnormalities. I agree with the radiologist interpretation.   Cardiac Monitoring:  The patient was maintained on a cardiac monitor.  I personally viewed and interpreted the cardiac monitored which showed an underlying rhythm of: NSR  Afebrile, hemodynamically stable.  Patient is a 56 year old male with who presents ED for evaluation of nausea and vomiting.  He did have a colonoscopy 2 days ago, and reports that he had similar symptoms last time and a colonoscopy.  He has been taking Zofran at home without relief.  Patient initially reported improvement in symptoms with IV fluids and Reglan, however did fail his p.o. challenge.  At that time we initiated  droperidol and ordered a CT abdomen pelvis.  CT negative.  Droperidol was successful in reducing patient's symptoms.  P.o. challenge was successful at that time.  Patient will be sent a prescription for Phenergan.  He did receive a prescription for this on 05/11/2022, however states that he never picked up the medicine.  I urged patient to pick up his prescriptions so that he may reduce his symptoms.  Of note, patient was seen for similar complaints on 05/11/2022 and reportedly denied marijuana use.  Urine drug screen at that time did show THC in his urine.  Patient also denied marijuana prior to today's visit.  I suspect cannabis hyperemesis is on the differential for today's visit.  I gave patient strict return precautions.  Stable at discharge.  At this time there does not appear to be any evidence of an acute emergency medical condition and the patient appears stable for discharge with appropriate outpatient follow up. Diagnosis was discussed with patient who verbalizes understanding of care plan and is agreeable to discharge. I have discussed return precautions with patient and who verbalizes understanding. Patient encouraged to follow-up with their PCP within 1 week. All  questions answered.  Patient's case discussed with Dr. Truett Mainland who agrees with plan to discharge with follow-up.   Note: Portions of this report may have been transcribed using voice recognition software. Every effort was made to ensure accuracy; however, inadvertent computerized transcription errors may still be present.          Final Clinical Impression(s) / ED Diagnoses Final diagnoses:  None    Rx / DC Orders ED Discharge Orders     None         Daisean, [place], Hershal Coria 05/27/22 1519    Cristie Hem, MD 05/27/22 [phone]

## 2022-05-27 NOTE — ED Triage Notes (Signed)
Pt had colonoscopy routine. He had 3 polpys removed. He was given fluids and zofran in recovery. He states same thing happened last time he got this procedure- he got nauseated and had to get fluids. He has px for zofran at home but says not working- needs something stronger.

## 2022-05-27 NOTE — ED Notes (Signed)
Pt resting comfortable in bed at this time. All comfort and safety measures in place in room with bed in lowest position.   ?

## 2022-05-27 NOTE — ED Notes (Signed)
Patient stated he is feeling much better after 1 liter of fluids, given a cup of ice water.

## 2022-05-31 ENCOUNTER — Encounter: Payer: Self-pay | Admitting: Internal Medicine

## 2022-06-21 ENCOUNTER — Telehealth: Payer: Self-pay | Admitting: Internal Medicine

## 2022-06-21 NOTE — Telephone Encounter (Signed)
Patient called to go over path results. 

## 2022-06-21 NOTE — Telephone Encounter (Signed)
Path letters reviewed with pt over the phone.

## 2023-04-11 ENCOUNTER — Other Ambulatory Visit: Payer: Self-pay

## 2023-04-11 ENCOUNTER — Encounter (HOSPITAL_COMMUNITY): Payer: Self-pay

## 2023-04-11 ENCOUNTER — Emergency Department (HOSPITAL_COMMUNITY)
Admission: EM | Admit: 2023-04-11 | Discharge: 2023-04-11 | Disposition: A | Discharge status: Home / Self Care | Payer: Self-pay | Attending: Emergency Medicine | Admitting: Emergency Medicine

## 2023-04-11 ENCOUNTER — Emergency Department (HOSPITAL_COMMUNITY): Payer: Self-pay

## 2023-04-11 DIAGNOSIS — Y92410 Unspecified street and highway as the place of occurrence of the external cause: Secondary | ICD-10-CM | POA: Insufficient documentation

## 2023-04-11 DIAGNOSIS — M542 Cervicalgia: Secondary | ICD-10-CM | POA: Diagnosis not present

## 2023-04-11 DIAGNOSIS — R0781 Pleurodynia: Secondary | ICD-10-CM | POA: Insufficient documentation

## 2023-04-11 MED ORDER — ACETAMINOPHEN 500 MG PO TABS
1000.0000 mg | ORAL_TABLET | Freq: Once | ORAL | Status: AC
  Administered 2023-04-11: 1000 mg via ORAL
  Filled 2023-04-11: qty 2

## 2023-04-11 MED ORDER — METHOCARBAMOL 500 MG PO TABS: 500.0000 mg | ORAL_TABLET | Freq: Two times a day (BID) | ORAL | 0 refills | Status: DC
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
## 2023-04-11 NOTE — Discharge Instructions (Signed)
Your x-ray did not show any fractures. You at the very least have bruised your ribs.  Tylenol 1000 mg every 6 hours for pain.  Make sure you are drinking plenty of water.  I have prescribed you a muscle relaxer to take as needed for pain.  Make sure you are taking deep breaths 4 times daily as we discussed.

## 2023-05-24 ENCOUNTER — Ambulatory Visit: Payer: Self-pay | Attending: Nurse Practitioner | Admitting: Nurse Practitioner

## 2023-05-24 ENCOUNTER — Encounter: Payer: Self-pay | Admitting: Nurse Practitioner

## 2023-05-24 VITALS — BP 97/64 | HR 72 | Ht 73.0 in | Wt 148.2 lb

## 2023-05-24 DIAGNOSIS — R7309 Other abnormal glucose: Secondary | ICD-10-CM

## 2023-05-24 DIAGNOSIS — Z0000 Encounter for general adult medical examination without abnormal findings: Secondary | ICD-10-CM

## 2023-05-24 DIAGNOSIS — Z7689 Persons encountering health services in other specified circumstances: Secondary | ICD-10-CM

## 2023-05-24 NOTE — Progress Notes (Signed)
Knot on left side of chest under arm

## 2023-05-24 NOTE — Progress Notes (Signed)
Assessment & Plan:  Lee Greer was seen today for establish care.  Diagnoses and all orders for this visit:  Encounter for annual physical exam -     CBC with Differential/Platelet -     CMP14+EGFR -     Lipid panel -     Hemoglobin A1c  Encounter to establish care  Elevated glucose -     CMP14+EGFR -     Hemoglobin A1c    Patient has been counseled on age-appropriate routine health concerns for screening and prevention. These are reviewed and up-to-date. Referrals have been placed accordingly. Immunizations are up-to-date or declined.    Subjective:   Chief Complaint  Patient presents with   Establish Care   HPI Lee Greer 57 y.o. male presents to office today to reestablish care with me and for annual physical exam.  I have not seen him in office in over 4 years.  He is doing well today with no concerns or complaints aside from a mass on the left side of his chest he would like for me to examine today. Blood pressure is low normal today and he is asymptomatic.  Currently not taking any medications prescription or over-the-counter.  BP Readings from Last 3 Encounters:  05/24/23 97/64  04/11/23 (!) 121/90  05/27/22 (!) 127/96    Lab Results  Component Value Date   HGBA1C 5.6 09/26/2018     Review of Systems  Constitutional:  Negative for fever, malaise/fatigue and weight loss.  HENT: Negative.  Negative for nosebleeds.   Eyes: Negative.  Negative for blurred vision, double vision and photophobia.  Respiratory: Negative.  Negative for cough and shortness of breath.   Cardiovascular: Negative.  Negative for chest pain, palpitations and leg swelling.  Gastrointestinal: Negative.  Negative for heartburn, nausea and vomiting.  Genitourinary: Negative.   Musculoskeletal: Negative.  Negative for myalgias.  Skin: Negative.   Neurological: Negative.  Negative for dizziness, focal weakness, seizures and headaches.  Endo/Heme/Allergies: Negative.    Psychiatric/Behavioral: Negative.  Negative for suicidal ideas.     Past Medical History:  Diagnosis Date   Acute kidney injury (HCC) 04/13/2017   related to "dehydration" (04/14/2017)   Blood transfusion without reported diagnosis    Cannabinoid hyperemesis syndrome    Esophageal tear    Gastric ulcer    Gastritis with intestinal metaplasia of stomach    Gastroparesis    H. pylori infection    Hypokalemia    Malnutrition of moderate degree (Gomez: 60% to less than 75% of standard weight) (HCC)    Posthemorrhagic anemia    Upper GI bleed     Past Surgical History:  Procedure Laterality Date   BIOPSY  10/04/2018   Procedure: BIOPSY;  Surgeon: Beverley Fiedler, MD;  Location: [facility] ENDOSCOPY;  Service: Endoscopy;;   COLONOSCOPY  02/02/2019   Dr.Pyrtle   ESOPHAGOGASTRODUODENOSCOPY (EGD) WITH PROPOFOL N/A 09/27/2018   Procedure: ESOPHAGOGASTRODUODENOSCOPY (EGD) WITH PROPOFOL;  Surgeon: Charna Elizabeth, MD;  Location: [facility] ENDOSCOPY;  Service: Endoscopy;  Laterality: N/A;   ESOPHAGOGASTRODUODENOSCOPY (EGD) WITH PROPOFOL N/A 10/04/2018   Procedure: ESOPHAGOGASTRODUODENOSCOPY (EGD) WITH PROPOFOL;  Surgeon: Beverley Fiedler, MD;  Location: [facility] ENDOSCOPY;  Service: Endoscopy;  Laterality: N/A;   HOT HEMOSTASIS N/A 10/04/2018   Procedure: HOT HEMOSTASIS (ARGON PLASMA COAGULATION/BICAP);  Surgeon: Beverley Fiedler, MD;  Location: [facility];  Service: Endoscopy;  Laterality: N/A;   UPPER GASTROINTESTINAL ENDOSCOPY  02/02/2019   Dr.Pyrtle    Family History  Problem Relation Age of Onset  CAD Mother    Diabetes Neg Hx    Stroke Neg Hx    Colon cancer Neg Hx    Esophageal cancer Neg Hx    Ulcerative colitis Neg Hx    Stomach cancer Neg Hx     Social History Reviewed with no changes to be made today.   Outpatient Medications Prior to Visit  Medication Sig Dispense Refill   Capsaicin-Menthol-Methyl Sal (CAPSAICIN-METHYL SAL-MENTHOL) 0.025-1-12 % CREA Apply 1 Application topically 2 (two) times  daily as needed. (Patient not taking: Reported on 05/24/2023) 56.6 g 0   methocarbamol (ROBAXIN) 500 MG tablet Take 1 tablet (500 mg total) by mouth 2 (two) times daily. (Patient not taking: Reported on 05/24/2023) 20 tablet 0   Multiple Vitamin (ONE-A-DAY MENS PO) Take 1 tablet by mouth daily. (Patient not taking: Reported on 05/24/2023)     pantoprazole (PROTONIX) 40 MG tablet Take 1 tablet (40 mg total) by mouth 2 (two) times daily. (Patient not taking: Reported on 05/24/2023) 28 tablet 0   promethazine (PHENERGAN) 25 MG suppository Place 1 suppository (25 mg total) rectally every 6 (six) hours as needed for nausea or vomiting. (Patient not taking: Reported on 05/24/2023) 12 each 0   sucralfate (CARAFATE) 1 g tablet Take 1 tablet (1 g total) by mouth 4 (four) times daily -  with meals and at bedtime. (Patient not taking: Reported on 05/24/2023) 100 tablet 0   No facility-administered medications prior to visit.    No Known Allergies     Objective:    BP 97/64 (BP Location: Left Arm, Patient Position: Sitting, Cuff Size: Normal)   Pulse 72   Ht 6\' 1"  (1.854 m)   Wt 148 lb 3.2 oz (67.2 kg)   SpO2 98%   BMI 19.55 kg/m  Wt Readings from Last 3 Encounters:  05/24/23 148 lb 3.2 oz (67.2 kg)  04/11/23 150 lb (68 kg)  05/27/22 138 lb (62.6 kg)    Physical Exam Vitals and nursing note reviewed.  Constitutional:      Appearance: He is well-developed.  HENT:     Head: Normocephalic and atraumatic.     Right Ear: Hearing, tympanic membrane, ear canal and external ear normal.     Left Ear: Hearing, tympanic membrane, ear canal and external ear normal.     Nose: Nose normal. No mucosal edema or rhinorrhea.     Right Turbinates: Not enlarged.     Left Turbinates: Not enlarged.     Mouth/Throat:     Lips: Pink.     Mouth: Mucous membranes are moist.     Dentition: No gingival swelling, dental abscesses or gum lesions.     Pharynx: Uvula midline.     Tonsils: No tonsillar exudate. 1+ on the  right. 1+ on the left.  Eyes:     General: Lids are normal. No scleral icterus.    Extraocular Movements: Extraocular movements intact.     Conjunctiva/sclera: Conjunctivae normal.     Pupils: Pupils are equal, round, and reactive to light.  Neck:     Thyroid: No thyromegaly.     Trachea: No tracheal deviation.  Cardiovascular:     Rate and Rhythm: Normal rate and regular rhythm.     Heart sounds: Normal heart sounds. No murmur heard.    No friction rub. No gallop.  Pulmonary:     Effort: Pulmonary effort is normal. No tachypnea or respiratory distress.     Breath sounds: Normal breath sounds. No decreased breath sounds, wheezing,  rhonchi or rales.  Chest:     Chest wall: No mass or tenderness.  Breasts:    Right: No inverted nipple, mass, nipple discharge, skin change or tenderness.     Left: No inverted nipple, mass, nipple discharge, skin change or tenderness.  Abdominal:     General: Bowel sounds are normal. There is no distension.     Palpations: Abdomen is soft. There is no mass.     Tenderness: There is no abdominal tenderness. There is no guarding or rebound.  Musculoskeletal:        General: No tenderness or deformity. Normal range of motion.     Cervical back: Normal range of motion and neck supple.  Lymphadenopathy:     Cervical: No cervical adenopathy.  Skin:    General: Skin is warm and dry.     Capillary Refill: Capillary refill takes less than 2 seconds.     Findings: No erythema.          Comments: Soft, movable, non tender lipoma appearing mass adjacent to the left mid axillary line  Neurological:     Mental Status: He is alert and oriented to person, place, and time.     Cranial Nerves: No cranial nerve deficit.     Sensory: Sensation is intact.     Motor: No abnormal muscle tone.     Coordination: Coordination is intact. Coordination normal.     Gait: Gait is intact.     Deep Tendon Reflexes: Reflexes normal.     Reflex Scores:      Patellar reflexes  are 1+ on the right side and 1+ on the left side. Psychiatric:        Attention and Perception: Attention normal.        Mood and Affect: Mood normal.        Speech: Speech normal.        Behavior: Behavior normal. Behavior is cooperative.        Thought Content: Thought content normal.        Judgment: Judgment normal.          Patient has been counseled extensively about nutrition and exercise as well as the importance of adherence with medications and regular follow-up. The patient was given clear instructions to go to ER or return to medical center if symptoms don't improve, worsen or new problems develop. The patient verbalized understanding.   Follow-up: Return if symptoms worsen or fail to improve.   Claiborne Rigg, FNP-BC [facility] and [facility] [place], [place] [phone]   05/24/2023, 10:46 AM

## 2023-05-28 ENCOUNTER — Emergency Department (HOSPITAL_COMMUNITY)
Admission: EM | Admit: 2023-05-28 | Discharge: 2023-05-28 | Disposition: A | Discharge status: Home / Self Care | Payer: Self-pay | Attending: Emergency Medicine | Admitting: Emergency Medicine

## 2023-05-28 ENCOUNTER — Emergency Department (HOSPITAL_COMMUNITY): Payer: Self-pay

## 2023-05-28 ENCOUNTER — Encounter (HOSPITAL_COMMUNITY): Payer: Self-pay

## 2023-05-28 ENCOUNTER — Other Ambulatory Visit: Payer: Self-pay

## 2023-05-28 DIAGNOSIS — E860 Dehydration: Secondary | ICD-10-CM | POA: Insufficient documentation

## 2023-05-28 LAB — CBC WITH DIFFERENTIAL/PLATELET
Abs Immature Granulocytes: 0.06 10*3/uL (ref 0.00–0.07)
Basophils Absolute: 0 10*3/uL (ref 0.0–0.1)
Basophils Relative: 0 %
Eosinophils Absolute: 0 10*3/uL (ref 0.0–0.5)
Eosinophils Relative: 0 %
HCT: 38.6 % — ABNORMAL LOW (ref 39.0–52.0)
Hemoglobin: 12.9 g/dL — ABNORMAL LOW (ref 13.0–17.0)
Immature Granulocytes: 0 %
Lymphocytes Relative: 17 %
Lymphs Abs: 2.5 10*3/uL (ref 0.7–4.0)
MCH: 30.4 pg (ref 26.0–34.0)
MCHC: 33.4 g/dL (ref 30.0–36.0)
MCV: 91 fL (ref 80.0–100.0)
Monocytes Absolute: 1.1 10*3/uL — ABNORMAL HIGH (ref 0.1–1.0)
Monocytes Relative: 7 %
Neutro Abs: 11.1 10*3/uL — ABNORMAL HIGH (ref 1.7–7.7)
Neutrophils Relative %: 76 %
Platelets: UNDETERMINED 10*3/uL (ref 150–400)
RBC: 4.24 MIL/uL (ref 4.22–5.81)
RDW: 13 % (ref 11.5–15.5)
WBC: 14.8 10*3/uL — ABNORMAL HIGH (ref 4.0–10.5)
nRBC: 0 % (ref 0.0–0.2)

## 2023-05-28 LAB — COMPREHENSIVE METABOLIC PANEL
ALT: 16 U/L (ref 0–44)
AST: 19 U/L (ref 15–41)
Albumin: 4.3 g/dL (ref 3.5–5.0)
Alkaline Phosphatase: 78 U/L (ref 38–126)
Anion gap: 12 (ref 5–15)
BUN: 19 mg/dL (ref 6–20)
CO2: 23 mmol/L (ref 22–32)
Calcium: 9 mg/dL (ref 8.9–10.3)
Chloride: 103 mmol/L (ref 98–111)
Creatinine, Ser: 0.93 mg/dL (ref 0.61–1.24)
GFR, Estimated: >60 mL/min (ref >60)
Glucose, Bld: 111 mg/dL — ABNORMAL HIGH (ref 70–99)
Potassium: 3.3 mmol/L — ABNORMAL LOW (ref 3.5–5.1)
Sodium: 138 mmol/L (ref 135–145)
Total Bilirubin: 0.8 mg/dL (ref 0.3–1.2)
Total Protein: 8.9 g/dL — ABNORMAL HIGH (ref 6.5–8.1)

## 2023-05-28 MED ORDER — ONDANSETRON 8 MG PO TBDP
8.0000 mg | ORAL_TABLET | Freq: Once | ORAL | Status: AC
  Administered 2023-05-28: 8 mg via ORAL
  Filled 2023-05-28: qty 1

## 2023-05-28 MED ORDER — LACTATED RINGERS IV BOLUS
1000.0000 mL | Freq: Once | INTRAVENOUS | Status: AC
  Administered 2023-05-28: 1000 mL via INTRAVENOUS

## 2023-05-28 MED ORDER — METOCLOPRAMIDE HCL 5 MG/ML IJ SOLN
10.0000 mg | Freq: Once | INTRAMUSCULAR | Status: AC
  Administered 2023-05-28: 10 mg via INTRAVENOUS
  Filled 2023-05-28: qty 2

## 2023-05-28 MED ORDER — LACTATED RINGERS IV SOLN: INTRAVENOUS | Status: DC

## 2023-05-28 NOTE — ED Triage Notes (Signed)
Pt presents with c/o vomiting for several days. Pt reports his symptoms started 3 days ago with coughing up mucus. Pt reports he has also had some diarrhea and his urine has been darker in color.

## 2023-05-28 NOTE — ED Notes (Signed)
Pt states he is unable to keep PO water down.

## 2023-05-28 NOTE — ED Notes (Signed)
Provided patient 2 8oz cups of water, PO per Dr Freida Busman

## 2023-05-28 NOTE — ED Notes (Signed)
Pt off floor to xray

## 2023-05-28 NOTE — ED Provider Notes (Signed)
[place] EMERGENCY DEPARTMENT AT [facility] Provider Note   CSN: [id number] Arrival date & time: 05/28/23  1116     History  Chief Complaint  Patient presents with   Vomiting    Lee Greer is a 57 y.o. male.  57 year old male presents for concern for dehydration.  States that he has had URI symptoms which are slowly been resolving.  Has had some nonproductive cough with it.  Denies any severe diarrhea.  Is able to keep down liquids at this time.  Notes that his urine was initially dark and is since improved.  Denies any dizziness with standing.  No syncope or near syncope.       Home Medications Prior to Admission medications   Not on File      Allergies    Patient has no known allergies.    Review of Systems   Review of Systems  All other systems reviewed and are negative.   Physical Exam Updated Vital Signs BP (!) 143/91 (BP Location: Left Arm)   Pulse 91   Temp 98.1 F (36.7 C) (Oral)   Resp 18   Ht 1.829 m (6')   SpO2 98%   BMI 20.10 kg/m  Physical Exam Vitals and nursing note reviewed.  Constitutional:      General: He is not in acute distress.    Appearance: Normal appearance. He is well-developed. He is not toxic-appearing.  HENT:     Head: Normocephalic and atraumatic.  Eyes:     General: Lids are normal.     Conjunctiva/sclera: Conjunctivae normal.     Pupils: Pupils are equal, round, and reactive to light.  Neck:     Thyroid: No thyroid mass.     Trachea: No tracheal deviation.  Cardiovascular:     Rate and Rhythm: Normal rate and regular rhythm.     Heart sounds: Normal heart sounds. No murmur heard.    No gallop.  Pulmonary:     Effort: Pulmonary effort is normal. No respiratory distress.     Breath sounds: Normal breath sounds. No stridor. No decreased breath sounds, wheezing, rhonchi or rales.  Abdominal:     General: There is no distension.     Palpations: Abdomen is soft.     Tenderness: There is no abdominal  tenderness. There is no rebound.  Musculoskeletal:        General: No tenderness. Normal range of motion.     Cervical back: Normal range of motion and neck supple.  Skin:    General: Skin is warm and dry.     Findings: No abrasion or rash.  Neurological:     Mental Status: He is alert and oriented to person, place, and time. Mental status is at baseline.     GCS: GCS eye subscore is 4. GCS verbal subscore is 5. GCS motor subscore is 6.     Cranial Nerves: Cranial nerves are intact. No cranial nerve deficit.     Sensory: No sensory deficit.     Motor: Motor function is intact.  Psychiatric:        Attention and Perception: Attention normal.        Speech: Speech normal.        Behavior: Behavior normal.     ED Results / Procedures / Treatments   Labs (all labs ordered are listed, but only abnormal results are displayed) Labs Reviewed  URINALYSIS, ROUTINE W REFLEX MICROSCOPIC  COMPREHENSIVE METABOLIC PANEL  CBC WITH DIFFERENTIAL/PLATELET  EKG None  Radiology No results found.  Procedures Procedures    Medications Ordered in ED Medications - No data to display  ED Course/ Medical Decision Making/ A&P                                 Medical Decision Making Amount and/or Complexity of Data Reviewed Labs: ordered. Radiology: ordered.  Risk Prescription drug management.   Patient given IV fluids here and feels better.  Treated with antiemetics as well to.  Patient had been complaining of cough and chest x-ray performed rotation shows no acute findings.  Will discharge home and follow-up with his doctor as needed        Final Clinical Impression(s) / ED Diagnoses Final diagnoses:  None    Rx / DC Orders ED Discharge Orders     None         Lorre Nick, MD 05/28/23 1446

## 2023-05-30 ENCOUNTER — Encounter (HOSPITAL_COMMUNITY): Payer: Self-pay | Admitting: *Deleted

## 2023-05-30 ENCOUNTER — Emergency Department (HOSPITAL_COMMUNITY): Payer: Self-pay

## 2023-05-30 ENCOUNTER — Other Ambulatory Visit: Payer: Self-pay

## 2023-05-30 ENCOUNTER — Emergency Department (HOSPITAL_COMMUNITY)
Admission: EM | Admit: 2023-05-30 | Discharge: 2023-05-30 | Disposition: A | Discharge status: Home / Self Care | Payer: Self-pay | Attending: Emergency Medicine | Admitting: Emergency Medicine

## 2023-05-30 DIAGNOSIS — K3184 Gastroparesis: Secondary | ICD-10-CM | POA: Insufficient documentation

## 2023-05-30 DIAGNOSIS — R112 Nausea with vomiting, unspecified: Secondary | ICD-10-CM

## 2023-05-30 DIAGNOSIS — D72829 Elevated white blood cell count, unspecified: Secondary | ICD-10-CM | POA: Insufficient documentation

## 2023-05-30 LAB — COMPREHENSIVE METABOLIC PANEL
ALT: 15 U/L (ref 0–44)
AST: 17 U/L (ref 15–41)
Albumin: 4.3 g/dL (ref 3.5–5.0)
Alkaline Phosphatase: 84 U/L (ref 38–126)
Anion gap: 18 — ABNORMAL HIGH (ref 5–15)
BUN: 32 mg/dL — ABNORMAL HIGH (ref 6–20)
CO2: 20 mmol/L — ABNORMAL LOW (ref 22–32)
Calcium: 9.5 mg/dL (ref 8.9–10.3)
Chloride: 102 mmol/L (ref 98–111)
Creatinine, Ser: 0.92 mg/dL (ref 0.61–1.24)
GFR, Estimated: >60 mL/min (ref >60)
Glucose, Bld: 140 mg/dL — ABNORMAL HIGH (ref 70–99)
Potassium: 3.3 mmol/L — ABNORMAL LOW (ref 3.5–5.1)
Sodium: 140 mmol/L (ref 135–145)
Total Bilirubin: 1 mg/dL (ref 0.3–1.2)
Total Protein: 9.5 g/dL — ABNORMAL HIGH (ref 6.5–8.1)

## 2023-05-30 LAB — CBC
HCT: 38.8 % — ABNORMAL LOW (ref 39.0–52.0)
Hemoglobin: 13.1 g/dL (ref 13.0–17.0)
MCH: 30.2 pg (ref 26.0–34.0)
MCHC: 33.8 g/dL (ref 30.0–36.0)
MCV: 89.4 fL (ref 80.0–100.0)
Platelets: 301 10*3/uL (ref 150–400)
RBC: 4.34 MIL/uL (ref 4.22–5.81)
RDW: 12.6 % (ref 11.5–15.5)
WBC: 20.6 10*3/uL — ABNORMAL HIGH (ref 4.0–10.5)
nRBC: 0 % (ref 0.0–0.2)

## 2023-05-30 LAB — LIPASE, BLOOD: Lipase: 19 U/L (ref 11–51)

## 2023-05-30 MED ORDER — SODIUM CHLORIDE 0.9 % IV BOLUS
1000.0000 mL | Freq: Once | INTRAVENOUS | Status: AC
  Administered 2023-05-30: 1000 mL via INTRAVENOUS

## 2023-05-30 MED ORDER — METOCLOPRAMIDE HCL 5 MG/ML IJ SOLN
10.0000 mg | Freq: Once | INTRAMUSCULAR | Status: AC
  Administered 2023-05-30: 10 mg via INTRAVENOUS
  Filled 2023-05-30: qty 2

## 2023-05-30 MED ORDER — METOCLOPRAMIDE HCL 10 MG PO TABS: 10.0000 mg | ORAL_TABLET | Freq: Four times a day (QID) | ORAL | 0 refills | Status: DC

## 2023-05-30 MED ORDER — ONDANSETRON HCL 4 MG/2ML IJ SOLN
4.0000 mg | Freq: Once | INTRAMUSCULAR | Status: AC
  Administered 2023-05-30: 4 mg via INTRAVENOUS
  Filled 2023-05-30: qty 2

## 2023-05-30 MED ORDER — IOHEXOL 300 MG/ML  SOLN: 100.0000 mL | Freq: Once | INTRAMUSCULAR | Status: DC | PRN

## 2023-05-30 MED ORDER — ONDANSETRON HCL 4 MG PO TABS: 4.0000 mg | ORAL_TABLET | Freq: Four times a day (QID) | ORAL | 0 refills | Status: DC | PRN

## 2023-05-30 NOTE — ED Triage Notes (Signed)
Pt reports vomiting for about 4 days, he was seen here for the same. Pt says he is still having vomiting. Denies fevers.

## 2023-05-30 NOTE — ED Provider Notes (Signed)
[place] EMERGENCY DEPARTMENT AT [facility] Provider Note   CSN: [id number] Arrival date & time: 05/30/23  0241     History  Chief Complaint  Patient presents with   Emesis    Lee Greer is a 57 y.o. male.  Patient returns with persistent nausea and vomiting.  Patient seen in the ED 2 days ago with same.  He reports that he has been using the medications but he still has not been able to hold anything down.  Patient with history of gastroparesis, gastric ulcer.  No hematemesis or melena.       Home Medications Prior to Admission medications   Medication Sig Start Date End Date Taking? Authorizing Provider  metoCLOPramide (REGLAN) 10 MG tablet Take 1 tablet (10 mg total) by mouth every 6 (six) hours. 05/30/23  Yes Dragon Thrush, Canary Brim, MD  ondansetron (ZOFRAN) 4 MG tablet Take 1 tablet (4 mg total) by mouth every 6 (six) hours as needed for nausea or vomiting. 05/30/23  Yes Camy Leder, Canary Brim, MD      Allergies    Patient has no known allergies.    Review of Systems   Review of Systems  Physical Exam Updated Vital Signs BP (!) 159/99   Pulse 83   Temp (!) 97.3 F (36.3 C)   Resp 16   SpO2 100%  Physical Exam Vitals and nursing note reviewed.  Constitutional:      General: He is not in acute distress.    Appearance: He is well-developed.  HENT:     Head: Normocephalic and atraumatic.     Mouth/Throat:     Mouth: Mucous membranes are moist.  Eyes:     General: Vision grossly intact. Gaze aligned appropriately.     Extraocular Movements: Extraocular movements intact.     Conjunctiva/sclera: Conjunctivae normal.  Cardiovascular:     Rate and Rhythm: Normal rate and regular rhythm.     Pulses: Normal pulses.     Heart sounds: Normal heart sounds, S1 normal and S2 normal. No murmur heard.    No friction rub. No gallop.  Pulmonary:     Effort: Pulmonary effort is normal. No respiratory distress.     Breath sounds: Normal breath sounds.   Abdominal:     Palpations: Abdomen is soft.     Tenderness: There is generalized abdominal tenderness. There is no guarding or rebound.     Hernia: No hernia is present.  Musculoskeletal:        General: No swelling.     Cervical back: Full passive range of motion without pain, normal range of motion and neck supple. No pain with movement, spinous process tenderness or muscular tenderness. Normal range of motion.     Right lower leg: No edema.     Left lower leg: No edema.  Skin:    General: Skin is warm and dry.     Capillary Refill: Capillary refill takes less than 2 seconds.     Findings: No ecchymosis, erythema, lesion or wound.  Neurological:     Mental Status: He is alert and oriented to person, place, and time.     GCS: GCS eye subscore is 4. GCS verbal subscore is 5. GCS motor subscore is 6.     Cranial Nerves: Cranial nerves 2-12 are intact.     Sensory: Sensation is intact.     Motor: Motor function is intact. No weakness or abnormal muscle tone.     Coordination: Coordination is intact.  Psychiatric:        Mood and Affect: Mood normal.        Speech: Speech normal.        Behavior: Behavior normal.     ED Results / Procedures / Treatments   Labs (all labs ordered are listed, but only abnormal results are displayed) Labs Reviewed  COMPREHENSIVE METABOLIC PANEL - Abnormal; Notable for the following components:      Result Value   Potassium 3.3 (*)    CO2 20 (*)    Glucose, Bld 140 (*)    BUN 32 (*)    Total Protein 9.5 (*)    Anion gap 18 (*)    All other components within normal limits  CBC - Abnormal; Notable for the following components:   WBC 20.6 (*)    HCT 38.8 (*)    All other components within normal limits  LIPASE, BLOOD  URINALYSIS, ROUTINE W REFLEX MICROSCOPIC  RAPID URINE DRUG SCREEN, HOSP PERFORMED    EKG None  Radiology DG Chest 2 View  Result Date: 05/28/2023 CLINICAL DATA:  Cough. EXAM: CHEST - 2 VIEW COMPARISON:  04/11/2023.  FINDINGS: Bilateral lung fields are clear. Bilateral costophrenic angles are clear. Normal cardio-mediastinal silhouette. No acute osseous abnormalities. The soft tissues are within normal limits. IMPRESSION: No active cardiopulmonary disease. Electronically Signed   By: Jules Schick M.D.   On: 05/28/2023 12:26    Procedures Procedures    Medications Ordered in ED Medications  iohexol (OMNIPAQUE) 300 MG/ML solution 100 mL (has no administration in time range)  sodium chloride 0.9 % bolus 1,000 mL (1,000 mLs Intravenous New Bag/Given 05/30/23 0322)    Followed by  sodium chloride 0.9 % bolus 1,000 mL (1,000 mLs Intravenous New Bag/Given 05/30/23 0321)  ondansetron (ZOFRAN) injection 4 mg (4 mg Intravenous Given 05/30/23 0323)  metoCLOPramide (REGLAN) injection 10 mg (10 mg Intravenous Given 05/30/23 0323)    ED Course/ Medical Decision Making/ A&P                                 Medical Decision Making Amount and/or Complexity of Data Reviewed Labs: ordered.  Risk Prescription drug management.   Differential Diagnosis considered includes, but not limited to: Cholelithiasis; cholecystitis; cholangitis; bowel obstruction; esophagitis; gastritis; peptic ulcer disease; pancreatitis; gastroparesis; cannabinoid hyperemesis  Patient with history of gastroparesis, gastric ulcer presents with persistent nausea and vomiting.  Prior records also indicate possibility of cannabinoid hyperemesis.  Patient seen 2 days ago with vomiting, has not been able to hold anything down since he was in the ED.  Patient had an IV established, was administered IV Zofran, IV Reglan and 2 L of fluids.  Lab work reveals leukocytosis.  This is possibly reactive to his vomiting, but I did discuss with him the possibility of infection.  I recommended CT of abdomen and pelvis but he does not think he needs one.  He is feeling much better now.  Repeat examination reveals he is comfortable, no abdominal pain, abdomen  nontender.  Patient understands that I am concerned about possible intra-abdominal infection, declines the CT.  As he is improved, will treat symptomatically.  He is tolerating oral fluids at this time.        Final Clinical Impression(s) / ED Diagnoses Final diagnoses:  Nausea and vomiting, unspecified vomiting type  Gastroparesis    Rx / DC Orders ED Discharge Orders  Ordered    metoCLOPramide (REGLAN) 10 MG tablet  Every 6 hours        05/30/23 0540    ondansetron (ZOFRAN) 4 MG tablet  Every 6 hours PRN        05/30/23 0540              Gilda Crease, MD 05/30/23 [phone]

## 2024-02-06 ENCOUNTER — Other Ambulatory Visit: Payer: Self-pay

## 2024-02-06 ENCOUNTER — Emergency Department (HOSPITAL_COMMUNITY): Payer: Self-pay

## 2024-02-06 ENCOUNTER — Inpatient Hospital Stay (HOSPITAL_COMMUNITY)
Admission: EM | Admit: 2024-02-06 | Discharge: 2024-02-10 | DRG: 321 | Disposition: A | Discharge status: Home / Self Care | Payer: Self-pay | Attending: Student in an Organized Health Care Education/Training Program | Admitting: Student in an Organized Health Care Education/Training Program

## 2024-02-06 ENCOUNTER — Encounter (HOSPITAL_COMMUNITY): Payer: Self-pay | Admitting: Emergency Medicine

## 2024-02-06 DIAGNOSIS — E872 Acidosis, unspecified: Secondary | ICD-10-CM | POA: Diagnosis present

## 2024-02-06 DIAGNOSIS — Z7982 Long term (current) use of aspirin: Secondary | ICD-10-CM

## 2024-02-06 DIAGNOSIS — K3184 Gastroparesis: Secondary | ICD-10-CM | POA: Diagnosis present

## 2024-02-06 DIAGNOSIS — I2542 Coronary artery dissection: Secondary | ICD-10-CM | POA: Diagnosis not present

## 2024-02-06 DIAGNOSIS — Z8711 Personal history of peptic ulcer disease: Secondary | ICD-10-CM

## 2024-02-06 DIAGNOSIS — Z955 Presence of coronary angioplasty implant and graft: Secondary | ICD-10-CM

## 2024-02-06 DIAGNOSIS — I4510 Unspecified right bundle-branch block: Secondary | ICD-10-CM | POA: Diagnosis present

## 2024-02-06 DIAGNOSIS — R7989 Other specified abnormal findings of blood chemistry: Secondary | ICD-10-CM | POA: Diagnosis present

## 2024-02-06 DIAGNOSIS — Z8249 Family history of ischemic heart disease and other diseases of the circulatory system: Secondary | ICD-10-CM

## 2024-02-06 DIAGNOSIS — Z79899 Other long term (current) drug therapy: Secondary | ICD-10-CM

## 2024-02-06 DIAGNOSIS — E785 Hyperlipidemia, unspecified: Secondary | ICD-10-CM | POA: Diagnosis present

## 2024-02-06 DIAGNOSIS — F419 Anxiety disorder, unspecified: Secondary | ICD-10-CM | POA: Diagnosis present

## 2024-02-06 DIAGNOSIS — E876 Hypokalemia: Secondary | ICD-10-CM | POA: Diagnosis present

## 2024-02-06 DIAGNOSIS — I214 Non-ST elevation (NSTEMI) myocardial infarction: Principal | ICD-10-CM | POA: Diagnosis present

## 2024-02-06 DIAGNOSIS — R110 Nausea: Principal | ICD-10-CM

## 2024-02-06 DIAGNOSIS — R112 Nausea with vomiting, unspecified: Secondary | ICD-10-CM | POA: Diagnosis present

## 2024-02-06 DIAGNOSIS — I2510 Atherosclerotic heart disease of native coronary artery without angina pectoris: Secondary | ICD-10-CM | POA: Diagnosis present

## 2024-02-06 DIAGNOSIS — I10 Essential (primary) hypertension: Secondary | ICD-10-CM | POA: Diagnosis present

## 2024-02-06 LAB — I-STAT CG4 LACTIC ACID, ED
Lactic Acid, Venous: 3.1 mmol/L (ref 0.5–1.9)
Lactic Acid, Venous: 2.6 mmol/L (ref 0.5–1.9)

## 2024-02-06 LAB — I-STAT CHEM 8, ED
BUN: 21 mg/dL — ABNORMAL HIGH (ref 6–20)
Calcium, Ion: 1.07 mmol/L — ABNORMAL LOW (ref 1.15–1.40)
Chloride: 108 mmol/L (ref 98–111)
Creatinine, Ser: 1 mg/dL (ref 0.61–1.24)
Glucose, Bld: 129 mg/dL — ABNORMAL HIGH (ref 70–99)
HCT: 49 % (ref 39.0–52.0)
Hemoglobin: 16.7 g/dL (ref 13.0–17.0)
Potassium: 4.6 mmol/L (ref 3.5–5.1)
Sodium: 142 mmol/L (ref 135–145)
TCO2: 25 mmol/L (ref 22–32)

## 2024-02-06 LAB — CBC
HCT: 46.2 % (ref 39.0–52.0)
Hemoglobin: 15.6 g/dL (ref 13.0–17.0)
MCH: 30.1 pg (ref 26.0–34.0)
MCHC: 33.8 g/dL (ref 30.0–36.0)
MCV: 89 fL (ref 80.0–100.0)
Platelets: 303 10*3/uL (ref 150–400)
RBC: 5.19 MIL/uL (ref 4.22–5.81)
RDW: 13.2 % (ref 11.5–15.5)
WBC: 16.9 10*3/uL — ABNORMAL HIGH (ref 4.0–10.5)
nRBC: 0 % (ref 0.0–0.2)

## 2024-02-06 LAB — COMPREHENSIVE METABOLIC PANEL WITH GFR
ALT: 53 U/L — ABNORMAL HIGH (ref 0–44)
AST: 198 U/L — ABNORMAL HIGH (ref 15–41)
Albumin: 5 g/dL (ref 3.5–5.0)
Alkaline Phosphatase: 78 U/L (ref 38–126)
Anion gap: 16 — ABNORMAL HIGH (ref 5–15)
BUN: 18 mg/dL (ref 6–20)
CO2: 20 mmol/L — ABNORMAL LOW (ref 22–32)
Calcium: 10 mg/dL (ref 8.9–10.3)
Chloride: 103 mmol/L (ref 98–111)
Creatinine, Ser: 1.02 mg/dL (ref 0.61–1.24)
GFR, Estimated: >60 mL/min (ref >60)
Glucose, Bld: 137 mg/dL — ABNORMAL HIGH (ref 70–99)
Potassium: 4 mmol/L (ref 3.5–5.1)
Sodium: 139 mmol/L (ref 135–145)
Total Bilirubin: 0.7 mg/dL (ref 0.0–1.2)
Total Protein: 9.5 g/dL — ABNORMAL HIGH (ref 6.5–8.1)

## 2024-02-06 LAB — LIPASE, BLOOD: Lipase: 24 U/L (ref 11–51)

## 2024-02-06 LAB — HEPARIN LEVEL (UNFRACTIONATED): Heparin Unfractionated: 0.36 [IU]/mL (ref 0.30–0.70)

## 2024-02-06 LAB — TROPONIN I (HIGH SENSITIVITY)
Troponin I (High Sensitivity): 19818 ng/L (ref <18)
Troponin I (High Sensitivity): 16810 ng/L (ref <18)

## 2024-02-06 MED ORDER — SODIUM CHLORIDE 0.9 % IV BOLUS
1000.0000 mL | Freq: Once | INTRAVENOUS | Status: AC
  Administered 2024-02-06: 1000 mL via INTRAVENOUS

## 2024-02-06 MED ORDER — HEPARIN (PORCINE) 25000 UT/250ML-% IV SOLN
950.0000 [IU]/h | INTRAVENOUS | Status: DC
  Administered 2024-02-06: 800 [IU]/h via INTRAVENOUS
  Filled 2024-02-06: qty 250

## 2024-02-06 MED ORDER — SODIUM CHLORIDE 0.9 % IV SOLN
12.5000 mg | Freq: Four times a day (QID) | INTRAVENOUS | Status: DC | PRN
  Administered 2024-02-06 – 2024-02-09 (×2): 12.5 mg via INTRAVENOUS
  Filled 2024-02-06: qty 12.5
  Filled 2024-02-06: qty 0.5

## 2024-02-06 MED ORDER — HYDROGEN PEROXIDE 3 % EX SOLN
CUTANEOUS | Status: AC
Start: 2024-02-06 — End: 2024-02-07
  Filled 2024-02-06: qty 473

## 2024-02-06 MED ORDER — HEPARIN BOLUS VIA INFUSION
4000.0000 [IU] | Freq: Once | INTRAVENOUS | Status: AC
  Administered 2024-02-06: 4000 [IU] via INTRAVENOUS
  Filled 2024-02-06: qty 4000

## 2024-02-06 MED ORDER — ASPIRIN 81 MG PO CHEW
324.0000 mg | CHEWABLE_TABLET | Freq: Once | ORAL | Status: AC
  Administered 2024-02-06: 324 mg via ORAL
  Filled 2024-02-06: qty 4

## 2024-02-06 NOTE — Progress Notes (Signed)
 PHARMACY - ANTICOAGULATION CONSULT NOTE  Pharmacy Consult for heparin  Indication: chest pain/ACS  No Known Allergies  Patient Measurements: 67 kg Heparin dosing weight: 67kg    Vital Signs: Temp: 98.7 F (37.1 C) (06/02 1525) Temp Source: Oral (06/02 1525) BP: 136/105 (06/02 1705) Pulse Rate: 101 (06/02 1705)  Labs: Recent Labs    02/06/24 1538 02/06/24 1603  HGB 15.6 16.7  HCT 46.2 49.0  PLT 303  --   CREATININE 1.02 1.00  TROPONINIHS 19,818*  --     CrCl cannot be calculated (Unknown ideal weight.).   Medical History: Past Medical History:  Diagnosis Date   Acute kidney injury (HCC) 04/13/2017   related to "dehydration" (04/14/2017)   Blood transfusion without reported diagnosis    Cannabinoid hyperemesis syndrome    Esophageal tear    Gastric ulcer    Gastritis with intestinal metaplasia of stomach    Gastroparesis    H. pylori infection    Hypokalemia    Malnutrition of moderate degree (Gomez: 60% to less than 75% of standard weight) (HCC)    Posthemorrhagic anemia    Upper GI bleed     Assessment: Patient is a 58 y.o M who presented to the ED on 02/06/24 with c/o n/v and CP.  Troponin was found to be elevated.  Pharmacy has been consulted to dose heparin drip for ACS.  Today, 02/06/2024: - hgb and plts ok - scr 1.02  Goal of Therapy:  Heparin level 0.3-0.7 units/ml Monitor platelets by anticoagulation protocol: Yes   Plan:  - heparin 4000 units IV x1 bolus, then 800 units/hr - check 6 hr heparin level - monitor for s/sx bleeding  Katrinka Herbison P 02/06/2024,5:17 PM

## 2024-02-06 NOTE — ED Triage Notes (Signed)
 Patient c/o N/V x 3 days. Patient report unable to keep anything down since Saturday. Patient denies abdominal pain but c/o Chest pain 3 days ago. Patient denies SOB.

## 2024-02-06 NOTE — ED Provider Notes (Signed)
  EMERGENCY DEPARTMENT AT [facility] Provider Note   CSN: [id number] Arrival date & time: 02/06/24  1458     History  Chief Complaint  Patient presents with   Nausea   Emesis    Lee Greer is a 58 y.o. male.  58 year old male with prior medical history as detailed below presents for evaluation.  Patient with longstanding history of intermittent nausea and vomiting.  Patient reports nausea and vomiting over the weekend.  He is requesting antiemetics and IV fluids.  He reports 2 brief episodes of substernal chest discomfort -once on Thursday and then once on Friday.  Each episode lasted 15 minutes.  He denies any return of chest pain since.  He denies any bloody emesis.  He denies any prior cardiac history or cardiac workup.  The history is provided by the patient.       Home Medications Prior to Admission medications   Medication Sig Start Date End Date Taking? Authorizing Provider  metoCLOPramide  (REGLAN ) 10 MG tablet Take 1 tablet (10 mg total) by mouth every 6 (six) hours. 05/30/23   Ballard Bongo, MD  ondansetron  (ZOFRAN ) 4 MG tablet Take 1 tablet (4 mg total) by mouth every 6 (six) hours as needed for nausea or vomiting. 05/30/23   Pollina, Marine Sia, MD      Allergies    Patient has no known allergies.    Review of Systems   Review of Systems  All other systems reviewed and are negative.   Physical Exam Updated Vital Signs BP (!) 136/105   Pulse (!) 101   Temp 98.7 F (37.1 C) (Oral)   Resp (!) 30   SpO2 100%  Physical Exam Vitals and nursing note reviewed.  Constitutional:      General: He is not in acute distress.    Appearance: Normal appearance. He is well-developed.  HENT:     Head: Normocephalic and atraumatic.  Eyes:     Conjunctiva/sclera: Conjunctivae normal.     Pupils: Pupils are equal, round, and reactive to light.  Cardiovascular:     Rate and Rhythm: Normal rate and regular rhythm.     Heart  sounds: Normal heart sounds.  Pulmonary:     Effort: Pulmonary effort is normal. No respiratory distress.     Breath sounds: Normal breath sounds.  Abdominal:     General: There is no distension.     Palpations: Abdomen is soft.     Tenderness: There is no abdominal tenderness.  Musculoskeletal:        General: No deformity. Normal range of motion.     Cervical back: Normal range of motion and neck supple.  Skin:    General: Skin is warm and dry.  Neurological:     General: No focal deficit present.     Mental Status: He is alert and oriented to person, place, and time.    ED Results / Procedures / Treatments   Labs (all labs ordered are listed, but only abnormal results are displayed) Labs Reviewed  COMPREHENSIVE METABOLIC PANEL WITH GFR - Abnormal; Notable for the following components:      Result Value   CO2 20 (*)    Glucose, Bld 137 (*)    Total Protein 9.5 (*)    AST 198 (*)    ALT 53 (*)    Anion gap 16 (*)    All other components within normal limits  CBC - Abnormal; Notable for the following components:  WBC 16.9 (*)    All other components within normal limits  I-STAT CG4 LACTIC ACID, ED - Abnormal; Notable for the following components:   Lactic Acid, Venous 3.1 (*)    All other components within normal limits  I-STAT CHEM 8, ED - Abnormal; Notable for the following components:   BUN 21 (*)    Glucose, Bld 129 (*)    Calcium, Ion 1.07 (*)    All other components within normal limits  TROPONIN I (HIGH SENSITIVITY) - Abnormal; Notable for the following components:   Troponin I (High Sensitivity) 19,818 (*)    All other components within normal limits  LIPASE, BLOOD  URINALYSIS, ROUTINE W REFLEX MICROSCOPIC  RAPID URINE DRUG SCREEN, HOSP PERFORMED  I-STAT CG4 LACTIC ACID, ED  TROPONIN I (HIGH SENSITIVITY)    EKG EKG Interpretation Date/Time:  Monday February 06 2024 15:30:56 EDT Ventricular Rate:  114 PR Interval:  120 QRS Duration:  94 QT  Interval:  439 QTC Calculation: 605 R Axis:   67  Text Interpretation: Sinus tachycardia Biatrial enlargement Prolonged QT interval Confirmed by Angela Kell [phone]) on 02/06/2024 3:45:32 PM  Radiology DG Chest 1 View Result Date: 02/06/2024 CLINICAL DATA:  Nausea and vomiting. EXAM: CHEST  1 VIEW COMPARISON:  05/28/2023 FINDINGS: Chronic hyperinflated. The heart is normal in size. Mediastinal contours are stable. No evidence of pneumomediastinum. No focal airspace disease, pleural effusion or pneumothorax. No osseous abnormalities are seen. IMPRESSION: Chronic hyperinflation.  No acute findings. Electronically Signed   By: Chadwick Colonel M.D.   On: 02/06/2024 16:58    Procedures Procedures    Medications Ordered in ED Medications  promethazine  (PHENERGAN ) 12.5 mg in sodium chloride  0.9 % 50 mL IVPB (has no administration in time range)  sodium chloride  0.9 % bolus 1,000 mL (1,000 mLs Intravenous New Bag/Given 02/06/24 1611)  aspirin chewable tablet 324 mg (324 mg Oral Given 02/06/24 1713)    ED Course/ Medical Decision Making/ A&P                                 Medical Decision Making Amount and/or Complexity of Data Reviewed Labs: ordered. Radiology: ordered.  Risk OTC drugs. Prescription drug management. Decision regarding hospitalization.    Medical Screen Complete  This patient presented to the ED with complaint of nausea and vomiting.  This complaint involves an extensive number of treatment options. The initial differential diagnosis includes, but is not limited to, ACS, Metabolic abnormality, etc.   This presentation is: Acute, Chronic, Self-Limited, Previously Undiagnosed, Uncertain Prognosis, Complicated, Systemic Symptoms, and Threat to Life/Bodily Function  Patient presented with complaint of nausea and vomiting over the weekend.  Patient with longstanding history of prior similar symptoms.  Of note, patient did report intermittent chest pain last week on  Thursday and Friday.  He denies any active chest pain.  Obtained EKG is concerning for new ischemic changes.  Initial troponin is 19,000.  Case discussed with cardiology.  Dr. Maximo Spar will admit.  Patient will require further workup for likely late presentation NSTEMI.     Additional history obtained:  External records from outside sources obtained and reviewed including prior ED visits and prior Inpatient records.    Problem List / ED Course:  Nausea and vomiting, elevated troponin   Reevaluation:  After the interventions noted above, I reevaluated the patient and found that they have: improved   Disposition:  After consideration of the diagnostic  results and the patients response to treatment, I feel that the patent would benefit from admission.   CRITICAL CARE Performed by: Burnette Carte   Total critical care time: 30 minutes  Critical care time was exclusive of separately billable procedures and treating other patients.  Critical care was necessary to treat or prevent imminent or life-threatening deterioration.  Critical care was time spent personally by me on the following activities: development of treatment plan with patient and/or surrogate as well as nursing, discussions with consultants, evaluation of patient's response to treatment, examination of patient, obtaining history from patient or surrogate, ordering and performing treatments and interventions, ordering and review of laboratory studies, ordering and review of radiographic studies, pulse oximetry and re-evaluation of patient's condition.          Final Clinical Impression(s) / ED Diagnoses Final diagnoses:  Nausea  Elevated troponin    Rx / DC Orders ED Discharge Orders     None         Burnette Carte, MD 02/06/24 [phone]

## 2024-02-06 NOTE — ED Provider Triage Note (Signed)
 Emergency Medicine Provider Triage Evaluation Note  CECILIO OHLRICH , a 58 y.o. male  was evaluated in triage.  Pt complains of NV that's started on Saturday evening. Four episodes of vomiting Saturday night into Sunday. Unable to keep fluids down. No diarrhea, fever. Denies current cp, shob  Two episodes of CP that lasted 15 minutes each on Thursday and Friday evening. Denies shob  Review of Systems  Positive: See hpi Negative:   Physical Exam  BP (!) 132/97 (BP Location: Left Arm)   Pulse (!) 117   Temp 98.7 F (37.1 C) (Oral)   Resp 17   SpO2 100%  Gen:   Awake, no distress   Resp:  Normal effort  MSK:   Moves extremities without difficulty Other:  Speaking in full and complete sentences. Abd not TTP  Medical Decision Making  Medically screening exam initiated at 3:41 PM.  Appropriate orders placed.  Dudley Ghee was informed that the remainder of the evaluation will be completed by another provider, this initial triage assessment does not replace that evaluation, and the importance of remaining in the ED until their evaluation is complete.  Labs ordered. EKG   Royann Cords, [place] 02/06/24 [phone]

## 2024-02-07 ENCOUNTER — Inpatient Hospital Stay (HOSPITAL_COMMUNITY): Payer: Self-pay

## 2024-02-07 ENCOUNTER — Inpatient Hospital Stay (HOSPITAL_COMMUNITY)
Admission: EM | Disposition: A | Discharge status: Home / Self Care | Payer: Self-pay | Source: Home / Self Care | Attending: Internal Medicine

## 2024-02-07 DIAGNOSIS — I2510 Atherosclerotic heart disease of native coronary artery without angina pectoris: Secondary | ICD-10-CM

## 2024-02-07 DIAGNOSIS — I214 Non-ST elevation (NSTEMI) myocardial infarction: Secondary | ICD-10-CM | POA: Diagnosis present

## 2024-02-07 DIAGNOSIS — R7989 Other specified abnormal findings of blood chemistry: Secondary | ICD-10-CM

## 2024-02-07 HISTORY — PX: LEFT HEART CATH AND CORONARY ANGIOGRAPHY: CATH118249

## 2024-02-07 HISTORY — PX: CORONARY STENT INTERVENTION: CATH118234

## 2024-02-07 HISTORY — PX: CORONARY ULTRASOUND/IVUS: CATH118244

## 2024-02-07 LAB — ECHOCARDIOGRAM COMPLETE
Area-P 1/2: 4.77 cm2
Height: 72 in
S' Lateral: 2.9 cm
Weight: 2240 [oz_av]

## 2024-02-07 LAB — CBC
HCT: 44.3 % (ref 39.0–52.0)
Hemoglobin: 14.9 g/dL (ref 13.0–17.0)
MCH: 30.2 pg (ref 26.0–34.0)
MCHC: 33.6 g/dL (ref 30.0–36.0)
MCV: 89.9 fL (ref 80.0–100.0)
Platelets: 291 10*3/uL (ref 150–400)
RBC: 4.93 MIL/uL (ref 4.22–5.81)
RDW: 13.3 % (ref 11.5–15.5)
WBC: 16.4 10*3/uL — ABNORMAL HIGH (ref 4.0–10.5)
nRBC: 0 % (ref 0.0–0.2)

## 2024-02-07 LAB — LIPID PANEL
Cholesterol: 271 mg/dL — ABNORMAL HIGH (ref 0–200)
HDL: 109 mg/dL (ref >40)
LDL Cholesterol: 147 mg/dL — ABNORMAL HIGH (ref 0–99)
Total CHOL/HDL Ratio: 2.5 ratio
Triglycerides: 74 mg/dL (ref <150)
VLDL: 15 mg/dL (ref 0–40)

## 2024-02-07 LAB — COMPREHENSIVE METABOLIC PANEL WITH GFR
ALT: 42 U/L (ref 0–44)
AST: 103 U/L — ABNORMAL HIGH (ref 15–41)
Albumin: 4.5 g/dL (ref 3.5–5.0)
Alkaline Phosphatase: 74 U/L (ref 38–126)
Anion gap: 15 (ref 5–15)
BUN: 24 mg/dL — ABNORMAL HIGH (ref 6–20)
CO2: 25 mmol/L (ref 22–32)
Calcium: 9.4 mg/dL (ref 8.9–10.3)
Chloride: 103 mmol/L (ref 98–111)
Creatinine, Ser: 0.82 mg/dL (ref 0.61–1.24)
GFR, Estimated: >60 mL/min (ref >60)
Glucose, Bld: 128 mg/dL — ABNORMAL HIGH (ref 70–99)
Potassium: 4.2 mmol/L (ref 3.5–5.1)
Sodium: 143 mmol/L (ref 135–145)
Total Bilirubin: 1 mg/dL (ref 0.0–1.2)
Total Protein: 8.7 g/dL — ABNORMAL HIGH (ref 6.5–8.1)

## 2024-02-07 LAB — URINALYSIS, ROUTINE W REFLEX MICROSCOPIC
Bilirubin Urine: NEGATIVE
Glucose, UA: 50 mg/dL — AB
Ketones, ur: 5 mg/dL — AB
Leukocytes,Ua: NEGATIVE
Nitrite: NEGATIVE
Protein, ur: >=300 mg/dL — AB
Specific Gravity, Urine: 1.03 (ref 1.005–1.030)
pH: 5 (ref 5.0–8.0)

## 2024-02-07 LAB — LACTIC ACID, PLASMA: Lactic Acid, Venous: 1.8 mmol/L (ref 0.5–1.9)

## 2024-02-07 LAB — RAPID URINE DRUG SCREEN, HOSP PERFORMED
Amphetamines: NOT DETECTED
Barbiturates: NOT DETECTED
Benzodiazepines: NOT DETECTED
Cocaine: NOT DETECTED
Opiates: NOT DETECTED
Tetrahydrocannabinol: POSITIVE — AB

## 2024-02-07 LAB — HEMOGLOBIN A1C
Hgb A1c MFr Bld: 5.5 % (ref 4.8–5.6)
Mean Plasma Glucose: 111.15 mg/dL

## 2024-02-07 LAB — HIV ANTIBODY (ROUTINE TESTING W REFLEX): HIV Screen 4th Generation wRfx: NONREACTIVE

## 2024-02-07 LAB — POCT ACTIVATED CLOTTING TIME
Activated Clotting Time: 279 s
Activated Clotting Time: 487 s
Activated Clotting Time: 245 s

## 2024-02-07 LAB — HEPARIN LEVEL (UNFRACTIONATED)
Heparin Unfractionated: 0.2 [IU]/mL — ABNORMAL LOW (ref 0.30–0.70)
Heparin Unfractionated: 0.59 [IU]/mL (ref 0.30–0.70)

## 2024-02-07 SURGERY — LEFT HEART CATH AND CORONARY ANGIOGRAPHY
Anesthesia: LOCAL

## 2024-02-07 MED ORDER — SODIUM CHLORIDE 0.9 % WEIGHT BASED INFUSION
1.0000 mL/kg/h | INTRAVENOUS | Status: DC
Start: 2024-02-07 — End: 2024-02-07

## 2024-02-07 MED ORDER — SODIUM CHLORIDE 0.9 % WEIGHT BASED INFUSION
3.0000 mL/kg/h | INTRAVENOUS | Status: AC
  Administered 2024-02-07: 3 mL/kg/h via INTRAVENOUS

## 2024-02-07 MED ORDER — HEPARIN SODIUM (PORCINE) 1000 UNIT/ML IJ SOLN
INTRAMUSCULAR | Status: AC
  Filled 2024-02-07: qty 10

## 2024-02-07 MED ORDER — PERFLUTREN LIPID MICROSPHERE
1.0000 mL | INTRAVENOUS | Status: AC | PRN
  Administered 2024-02-07: 3 mL via INTRAVENOUS

## 2024-02-07 MED ORDER — IOHEXOL 350 MG/ML SOLN
INTRAVENOUS | Status: DC | PRN
Start: 2024-02-07 — End: 2024-02-07
  Administered 2024-02-07: 240 mL

## 2024-02-07 MED ORDER — VERAPAMIL HCL 2.5 MG/ML IV SOLN
INTRAVENOUS | Status: DC | PRN
  Administered 2024-02-07: 10 mL via INTRA_ARTERIAL

## 2024-02-07 MED ORDER — HEPARIN (PORCINE) IN NACL 1000-0.9 UT/500ML-% IV SOLN
INTRAVENOUS | Status: DC | PRN
Start: 2024-02-07 — End: 2024-02-07
  Administered 2024-02-07 (×3): 500 mL

## 2024-02-07 MED ORDER — ACETAMINOPHEN 325 MG PO TABS: 650.0000 mg | ORAL_TABLET | ORAL | Status: DC | PRN

## 2024-02-07 MED ORDER — SODIUM CHLORIDE 0.9 % IV SOLN: INTRAVENOUS | Status: AC

## 2024-02-07 MED ORDER — PRASUGREL HCL 10 MG PO TABS
ORAL_TABLET | ORAL | Status: DC | PRN
  Administered 2024-02-07: 60 mg via ORAL

## 2024-02-07 MED ORDER — HEPARIN BOLUS VIA INFUSION
1000.0000 [IU] | Freq: Once | INTRAVENOUS | Status: AC
  Administered 2024-02-07: 1000 [IU] via INTRAVENOUS
  Filled 2024-02-07: qty 1000

## 2024-02-07 MED ORDER — SODIUM CHLORIDE 0.9% FLUSH
3.0000 mL | INTRAVENOUS | Status: DC | PRN
Start: 2024-02-08 — End: 2024-02-10

## 2024-02-07 MED ORDER — CHLORHEXIDINE GLUCONATE CLOTH 2 % EX PADS
6.0000 | MEDICATED_PAD | Freq: Every day | CUTANEOUS | Status: DC
  Administered 2024-02-07 – 2024-02-10 (×4): 6 via TOPICAL

## 2024-02-07 MED ORDER — SODIUM CHLORIDE 0.9% FLUSH
3.0000 mL | Freq: Two times a day (BID) | INTRAVENOUS | Status: DC
  Administered 2024-02-07 – 2024-02-10 (×6): 3 mL via INTRAVENOUS

## 2024-02-07 MED ORDER — NOREPINEPHRINE 4 MG/250ML-% IV SOLN
INTRAVENOUS | Status: AC
  Filled 2024-02-07: qty 250

## 2024-02-07 MED ORDER — PRASUGREL HCL 10 MG PO TABS
ORAL_TABLET | ORAL | Status: AC
Start: 2024-02-07 — End: ?
  Filled 2024-02-07: qty 1

## 2024-02-07 MED ORDER — PRASUGREL HCL 10 MG PO TABS
ORAL_TABLET | ORAL | Status: AC
  Filled 2024-02-07: qty 5

## 2024-02-07 MED ORDER — VERAPAMIL HCL 2.5 MG/ML IV SOLN
INTRAVENOUS | Status: AC
  Filled 2024-02-07: qty 2

## 2024-02-07 MED ORDER — HYDRALAZINE HCL 50 MG PO TABS
50.0000 mg | ORAL_TABLET | Freq: Three times a day (TID) | ORAL | Status: DC
  Administered 2024-02-07 – 2024-02-08 (×2): 50 mg via ORAL
  Filled 2024-02-07 (×2): qty 1

## 2024-02-07 MED ORDER — LIDOCAINE HCL (PF) 1 % IJ SOLN
INTRAMUSCULAR | Status: AC
  Filled 2024-02-07: qty 30

## 2024-02-07 MED ORDER — MIDAZOLAM HCL 2 MG/2ML IJ SOLN
INTRAMUSCULAR | Status: DC | PRN
Start: 2024-02-07 — End: 2024-02-07
  Administered 2024-02-07: 1 mg via INTRAVENOUS

## 2024-02-07 MED ORDER — FENTANYL CITRATE (PF) 100 MCG/2ML IJ SOLN
INTRAMUSCULAR | Status: AC
  Filled 2024-02-07: qty 2

## 2024-02-07 MED ORDER — CAPTOPRIL 6.25 MG HALF TABLET: 6.2500 mg | ORAL_TABLET | Freq: Three times a day (TID) | ORAL | Status: DC

## 2024-02-07 MED ORDER — ATORVASTATIN CALCIUM 80 MG PO TABS
80.0000 mg | ORAL_TABLET | Freq: Every day | ORAL | Status: DC
  Administered 2024-02-07 – 2024-02-10 (×4): 80 mg via ORAL
  Filled 2024-02-07: qty 2
  Filled 2024-02-07 (×3): qty 1

## 2024-02-07 MED ORDER — LABETALOL HCL 5 MG/ML IV SOLN
10.0000 mg | INTRAVENOUS | Status: AC | PRN
Start: 2024-02-07 — End: 2024-02-08

## 2024-02-07 MED ORDER — LIDOCAINE HCL (PF) 1 % IJ SOLN
INTRAMUSCULAR | Status: DC | PRN
  Administered 2024-02-07: 2 mL via INTRADERMAL

## 2024-02-07 MED ORDER — SODIUM CHLORIDE 0.9 % IV SOLN: 250.0000 mL | INTRAVENOUS | Status: AC | PRN

## 2024-02-07 MED ORDER — PRASUGREL HCL 10 MG PO TABS
10.0000 mg | ORAL_TABLET | Freq: Every day | ORAL | Status: DC
  Administered 2024-02-08 – 2024-02-10 (×3): 10 mg via ORAL
  Filled 2024-02-07 (×3): qty 1

## 2024-02-07 MED ORDER — MIDAZOLAM HCL 2 MG/2ML IJ SOLN
INTRAMUSCULAR | Status: AC
  Filled 2024-02-07: qty 2

## 2024-02-07 MED ORDER — NITROGLYCERIN 0.4 MG SL SUBL: 0.4000 mg | SUBLINGUAL_TABLET | SUBLINGUAL | Status: DC | PRN

## 2024-02-07 MED ORDER — ASPIRIN 81 MG PO CHEW
81.0000 mg | CHEWABLE_TABLET | ORAL | Status: AC
  Administered 2024-02-07: 81 mg via ORAL
  Filled 2024-02-07: qty 1

## 2024-02-07 MED ORDER — HEPARIN SODIUM (PORCINE) 1000 UNIT/ML IJ SOLN
INTRAMUSCULAR | Status: DC | PRN
  Administered 2024-02-07: 3000 [IU] via INTRAVENOUS
  Administered 2024-02-07 (×2): 3500 [IU] via INTRAVENOUS

## 2024-02-07 MED ORDER — NITROGLYCERIN 1 MG/10 ML FOR IR/CATH LAB
INTRA_ARTERIAL | Status: AC
  Filled 2024-02-07: qty 10

## 2024-02-07 MED ORDER — NITROGLYCERIN 1 MG/10 ML FOR IR/CATH LAB
INTRA_ARTERIAL | Status: DC | PRN
  Administered 2024-02-07 (×2): 200 ug via INTRACORONARY

## 2024-02-07 MED ORDER — ASPIRIN 81 MG PO TBEC
81.0000 mg | DELAYED_RELEASE_TABLET | Freq: Every day | ORAL | Status: DC
  Administered 2024-02-08 – 2024-02-10 (×3): 81 mg via ORAL
  Filled 2024-02-07 (×3): qty 1

## 2024-02-07 MED ORDER — FENTANYL CITRATE (PF) 100 MCG/2ML IJ SOLN
INTRAMUSCULAR | Status: DC | PRN
  Administered 2024-02-07: 25 ug via INTRAVENOUS

## 2024-02-07 SURGICAL SUPPLY — 25 items
BALLOON EMERGE MR 2.5X15 (BALLOONS) IMPLANT
BALLOON SAPPHIRE 3.0X12 (BALLOONS) IMPLANT
BALLOON SAPPHIRE NC24 2.75X15 (BALLOONS) IMPLANT
BALLOON SAPPHIRE NC24 3.5X15 (BALLOONS) IMPLANT
BALLOON SAPPHIRE NC24 4.0X15 (BALLOONS) IMPLANT
CATH INFINITI 5 FR 3DRC (CATHETERS) IMPLANT
CATH INFINITI AMBI 5FR TG (CATHETERS) IMPLANT
CATH INFINITI JR4 5F (CATHETERS) IMPLANT
CATH LAUNCHER 6FR EBU3.5 (CATHETERS) IMPLANT
CATH OPTICROSS HD (CATHETERS) IMPLANT
DEVICE RAD TR BAND REGULAR (VASCULAR PRODUCTS) IMPLANT
DRAPE IVUS SLED (BAG) IMPLANT
ELECT DEFIB PAD ADLT CADENCE (PAD) IMPLANT
GLIDESHEATH SLEND A-KIT 6F 22G (SHEATH) IMPLANT
GUIDEWIRE INQWIRE 1.5J.035X260 (WIRE) IMPLANT
KIT ENCORE 26 ADVANTAGE (KITS) IMPLANT
KIT HEMO VALVE WATCHDOG (MISCELLANEOUS) IMPLANT
PACK CARDIAC CATHETERIZATION (CUSTOM PROCEDURE TRAY) ×1 IMPLANT
SET ATX-X65L (MISCELLANEOUS) IMPLANT
SHEATH 6FR 85 DEST SLENDER (SHEATH) IMPLANT
SHEATH PROBE COVER 6X72 (BAG) IMPLANT
STENT SYNERGY XD 2.50X48 (Permanent Stent) IMPLANT
WIRE ASAHI PROWATER 180CM (WIRE) IMPLANT
WIRE HI TORQ WHISPER MS 190CM (WIRE) IMPLANT
WIRE RUNTHROUGH .014X180CM (WIRE) IMPLANT

## 2024-02-07 NOTE — Progress Notes (Signed)
 PHARMACY - ANTICOAGULATION CONSULT NOTE  Pharmacy Consult for heparin  Indication: chest pain/ACS  No Known Allergies  Patient Measurements:  Height: 6' (182.9 cm) Weight: 63.5 kg (140 lb) IBW/kg (Calculated) : 77.6 HEPARIN DW (KG): 63.5  Vital Signs: Temp: 97.5 F (36.4 C) (06/02 1924) Temp Source: Oral (06/02 1924) BP: 148/108 (06/02 2150) Pulse Rate: 84 (06/02 2150)  Labs: Recent Labs    02/06/24 1538 02/06/24 1603 02/06/24 1748 02/06/24 2333  HGB 15.6 16.7  --   --   HCT 46.2 49.0  --   --   PLT 303  --   --   --   HEPARINUNFRC  --   --   --  0.36  CREATININE 1.02 1.00  --   --   TROPONINIHS 16,109*  --  16,810*  --     Estimated Creatinine Clearance: 73.2 mL/min (by C-G formula based on SCr of 1 mg/dL).   Medical History: Past Medical History:  Diagnosis Date   Acute kidney injury (HCC) 04/13/2017   related to "dehydration" (04/14/2017)   Blood transfusion without reported diagnosis    Cannabinoid hyperemesis syndrome    Esophageal tear    Gastric ulcer    Gastritis with intestinal metaplasia of stomach    Gastroparesis    H. pylori infection    Hypokalemia    Malnutrition of moderate degree (Gomez: 60% to less than 75% of standard weight) (HCC)    Posthemorrhagic anemia    Upper GI bleed     Assessment: Patient is a 58 y.o M who presented to the ED on 02/06/24 with c/o n/v and CP.  Troponin was found to be elevated.  Pharmacy has been consulted to dose heparin drip for ACS.  Today, 02/07/2024: Heparin level = 0.36 (therapeutic) with heparin gtt @ 800 units/hr No bleeding complications reported  Goal of Therapy:  Heparin level 0.3-0.7 units/ml Monitor platelets by anticoagulation protocol: Yes   Plan:  - Continue heparin gtt @ 800 units/hr - Check heparin level in 6 hr to confirm therapeutic dose  - Daily heparin level & CBC - monitor for s/sx bleeding  Jaequan Propes, Gabriel John, PharmD 02/07/2024,12:11 AM

## 2024-02-07 NOTE — Progress Notes (Signed)
 PHARMACY - ANTICOAGULATION CONSULT NOTE  Pharmacy Consult for heparin  Indication: chest pain/ACS  No Known Allergies  Patient Measurements:  Height: 6' (182.9 cm) Weight: 63.5 kg (140 lb) IBW/kg (Calculated) : 77.6 HEPARIN DW (KG): 63.5  Vital Signs: Temp: 99 F (37.2 C) (06/03 0642) Temp Source: Oral (06/03 0642) BP: 144/111 (06/03 0330) Pulse Rate: 83 (06/03 0330)  Labs: Recent Labs    02/06/24 1538 02/06/24 1603 02/06/24 1748 02/06/24 2333 02/07/24 0542  HGB 15.6 16.7  --   --  14.9  HCT 46.2 49.0  --   --  44.3  PLT 303  --   --   --  291  HEPARINUNFRC  --   --   --  0.36 0.20*  CREATININE 1.02 1.00  --   --   --   TROPONINIHS 16,109*  --  16,810*  --   --     Estimated Creatinine Clearance: 73.2 mL/min (by C-G formula based on SCr of 1 mg/dL).   Medical History: Past Medical History:  Diagnosis Date   Acute kidney injury (HCC) 04/13/2017   related to "dehydration" (04/14/2017)   Blood transfusion without reported diagnosis    Cannabinoid hyperemesis syndrome    Esophageal tear    Gastric ulcer    Gastritis with intestinal metaplasia of stomach    Gastroparesis    H. pylori infection    Hypokalemia    Malnutrition of moderate degree (Gomez: 60% to less than 75% of standard weight) (HCC)    Posthemorrhagic anemia    Upper GI bleed     Assessment: Patient is a 58 y.o M who presented to the ED on 02/06/24 with c/o n/v and CP.  Troponin was found to be elevated.  Pharmacy has been consulted to dose heparin drip for ACS.  Today, 02/07/2024: Heparin level SUBtherapeutic on current heparin gtt @ 800 units/hr CBC WNL No bleeding complications reported  Goal of Therapy:  Heparin level 0.3-0.7 units/ml Monitor platelets by anticoagulation protocol: Yes   Plan:  - Re-bolus 1000 units of IV heparin then - Increase IV Heparin rate from 800 to 950 units/hr - Check heparin level in 6 hr - Daily heparin level & CBC - monitor for s/sx bleeding   Luise Saint, PharmD, BCPS Secure Chat if ?s 02/07/2024 6:44 AM

## 2024-02-07 NOTE — ED Provider Notes (Signed)
 Patient pending for cardiology bed at [facility].  Repeat EKG shows no evidence of STEMI at this time.  Case discussed with cardiology and no emergent need to send patient to call at this time.  Troponins are downtrending.   Lind Repine, MD 02/07/24 [phone]

## 2024-02-07 NOTE — Interval H&P Note (Signed)
 History and Physical Interval Note:  02/07/2024 4:53 PM  Lee Greer  has presented today for surgery, with the diagnosis of NSTEMI.  The various methods of treatment have been discussed with the patient and family. After consideration of risks, benefits and other options for treatment, the patient has consented to  Procedure(s): RIGHT/LEFT HEART CATH AND CORONARY ANGIOGRAPHY (N/A) as a surgical intervention.  The patient's history has been reviewed, patient examined, no change in status, stable for surgery.  I have reviewed the patient's chart and labs.  Questions were answered to the patient's satisfaction.     Argyle Gustafson J Tien Spooner

## 2024-02-07 NOTE — ED Notes (Signed)
Pt having multiple episodes of emesis.

## 2024-02-07 NOTE — ED Notes (Signed)
 Pt continues to have multiple episodes of emesis

## 2024-02-07 NOTE — CV Procedure (Signed)
 Complex bifurcation PCI to Lcx/OM Acute vessel closure with initial wiring, successfully retrieved both branches Stented from OM into prox Lcx Synergy DES 2.5 X 48 mm, post dilated up to 4 mm PTCA Lcx with excellent result Small area of dissection distal to the stent in OM branch too distal for another stent. TIMI III flow with no chest pain and resolution of intra procedure ST elevation, therefore left alone. Residual mid RCA lesion, likely non culprit. Staged PCI either inpatient or outpatient. Full report to follow  Contrast used: 240 cc

## 2024-02-07 NOTE — ED Notes (Signed)
 Cardiology PAs are in evaluating pt and explaining POC.

## 2024-02-07 NOTE — H&P (Signed)
 Cardiology Admission History and Physical   Patient ID: Lee Greer MRN: [id number]; DOB: 04-Sep-1966   Admission date: 02/06/2024  PCP:  Collins Dean, NP    HeartCare Providers Cardiologist:  None     Chief Complaint:  Nausea, chest pain   Patient Profile: Lee Greer is a 58 y.o. male with past medical history of cannabinoid hyperemesis syndrome, esophageal tear, gastric ulcers, gastroparesis who is being seen 02/07/2024 for the evaluation of NSTEMI.  History of Present Illness: Mr. Lee Greer is a 58 year old male with above medical history.  Per chart review, patient has longstanding history of intermittent nausea and vomiting.  Does not appear to have any past cardiac history and does not follow with cardiology. Presented to the ED on 6/2 complaining of nausea and vomiting that have been ongoing for 3 days.  Also reported having 2 episodes of substernal chest discomfort, each lasting about 15 minutes.  In the ED, initial vital signs showed BP 132/97, oxygen 100% on room air, heart rate 117 bpm.  High-sensitivity troponin elevated 19,818> 16,810.  Drug screen positive for THC.  Urinalysis with many bacteria, moderate hemoglobin.  Lactic acid 3.1> 2.6.  Chest x-ray showed chronic hyperinflation, no acute findings. Initial EKG in the ED showed sinus tachycardia with heart rate 114 bpm, biatrial enlargement, LVH, ST depression in V3-V5  In the ED, patient was given Phenergan  for nausea/vomiting.  Also started on IV heparin with elevated troponin.  Started on aspirin 81 mg daily, Lipitor 80 mg daily.  On interview, patient reports that early on Friday morning around 1 AM, he was resting and watching television when he had sudden onset chest pain.  Has a difficult time describing pain, reports he feels like he "had a heart attack".  Also had diaphoresis.  Denies shortness of breath, nausea, syncope, near syncope.  After about 15 minutes, the pain went away on its own.  On  Friday during the day, he again had an episode of similar chest pain.  Pain again lasted about 15 minutes then resolved on its own.  He denies ever having chest pain before.  Denies personal cardiac history.  He admits to smoking marijuana but does not have any tobacco use history.  His mother had a heart attack when she was 42 years old.  On Saturday, patient had a large meal and started to vomit.  He was unable to keep water or ice chips down so he decided come to the ED for dehydration and nausea medications.  He has been given IV fluids and nausea medications, no longer has any complaints of nausea or vomiting.  He denies fever, chills, body aches.  Denies urinary symptoms.  Denies vomiting blood or melena.  Past Medical History:  Diagnosis Date   Acute kidney injury (HCC) 04/13/2017   related to "dehydration" (04/14/2017)   Blood transfusion without reported diagnosis    Cannabinoid hyperemesis syndrome    Esophageal tear    Gastric ulcer    Gastritis with intestinal metaplasia of stomach    Gastroparesis    H. pylori infection    Hypokalemia    Malnutrition of moderate degree (Gomez: 60% to less than 75% of standard weight) (HCC)    Posthemorrhagic anemia    Upper GI bleed    Past Surgical History:  Procedure Laterality Date   BIOPSY  10/04/2018   Procedure: BIOPSY;  Surgeon: Nannette Babe, MD;  Location: [facility] ENDOSCOPY;  Service: Endoscopy;;   COLONOSCOPY  02/02/2019  Dr.Pyrtle   ESOPHAGOGASTRODUODENOSCOPY (EGD) WITH PROPOFOL  N/A 09/27/2018   Procedure: ESOPHAGOGASTRODUODENOSCOPY (EGD) WITH PROPOFOL ;  Surgeon: Tami Falcon, MD;  Location: [facility] ENDOSCOPY;  Service: Endoscopy;  Laterality: N/A;   ESOPHAGOGASTRODUODENOSCOPY (EGD) WITH PROPOFOL  N/A 10/04/2018   Procedure: ESOPHAGOGASTRODUODENOSCOPY (EGD) WITH PROPOFOL ;  Surgeon: Nannette Babe, MD;  Location: [facility] ENDOSCOPY;  Service: Endoscopy;  Laterality: N/A;   HOT HEMOSTASIS N/A 10/04/2018   Procedure: HOT HEMOSTASIS (ARGON PLASMA  COAGULATION/BICAP);  Surgeon: Nannette Babe, MD;  Location: [facility];  Service: Endoscopy;  Laterality: N/A;   UPPER GASTROINTESTINAL ENDOSCOPY  02/02/2019   Dr.Pyrtle     Medications Prior to Admission: Prior to Admission medications   Medication Sig Start Date End Date Taking? Authorizing Provider  Multiple Vitamin (MULTIVITAMIN) tablet Take 1 tablet by mouth daily with breakfast.   Yes [provider]  metoCLOPramide  (REGLAN ) 10 MG tablet Take 1 tablet (10 mg total) by mouth every 6 (six) hours. Patient not taking: Reported on 02/06/2024 05/30/23   Ballard Bongo, MD  ondansetron  (ZOFRAN ) 4 MG tablet Take 1 tablet (4 mg total) by mouth every 6 (six) hours as needed for nausea or vomiting. Patient not taking: Reported on 02/06/2024 05/30/23   Ballard Bongo, MD     Allergies:   No Known Allergies  Social History:   Social History   Socioeconomic History   Marital status: Single    Spouse name: Not on file   Number of children: Not on file   Years of education: Not on file   Highest education level: Not on file  Occupational History   Not on file  Tobacco Use   Smoking status: Never   Smokeless tobacco: Never  Vaping Use   Vaping status: Never Used  Substance and Sexual Activity   Alcohol use: Not Currently    Comment: 04/14/2017 "sober since 03/2008"   Drug use: Not Currently    Types: Marijuana    Comment: last used 05/23/22   Sexual activity: Yes  Other Topics Concern   Not on file  Social History Narrative   Not on file   Social Drivers of Health   Financial Resource Strain: Not on file  Food Insecurity: No Food Insecurity (05/24/2023)   Hunger Vital Sign    Worried About Running Out of Food in the Last Year: Never true    Ran Out of Food in the Last Year: Never true  Transportation Needs: No Transportation Needs (05/24/2023)   PRAPARE - Administrator, Civil Service (Medical): No    Lack of Transportation (Non-Medical): No   Physical Activity: Not on file  Stress: Not on file  Social Connections: Not on file  Intimate Partner Violence: Not on file     Family History:   The patient's family history includes CAD in his mother. There is no history of Diabetes, Stroke, Colon cancer, Esophageal cancer, Ulcerative colitis, or Stomach cancer.    ROS:  Please see the history of present illness.  All other ROS reviewed and negative.     Physical Exam/Data: Vitals:   02/07/24 0021 02/07/24 0330 02/07/24 0642 02/07/24 0755  BP: (!) 165/112 (!) 144/111  (!) 128/106  Pulse: 81 83  (!) 113  Resp: 16 20  18   Temp: (!) 97.5 F (36.4 C) 98.3 F (36.8 C) 99 F (37.2 C)   TempSrc: Oral Oral Oral   SpO2: 99% 99%  98%  Weight:      Height:  Intake/Output Summary (Last 24 hours) at 02/07/2024 0825 Last data filed at 02/07/2024 0442 Gross per 24 hour  Intake 135.38 ml  Output --  Net 135.38 ml      02/06/2024    5:36 PM 02/06/2024    4:30 PM 05/28/2023   12:52 PM  Last 3 Weights  Weight (lbs) 140 lb 147 lb 11.3 oz 147 lb 11.3 oz  Weight (kg) 63.504 kg 67 kg 67 kg     Body mass index is 18.99 kg/m.  General:  Well nourished, well developed, in no acute distress.  Sitting upright in the bed HEENT: normal Neck: no JVD Vascular: Radial pulses 2+ bilaterally   Cardiac:  normal S1, S2; RRR; no murmur  Lungs:  clear to auscultation bilaterally, no wheezing, rhonchi or rales.  Normal work of breathing on room air Abd: soft, nontender Ext: no edema in bilateral lower extremities Musculoskeletal:  No deformities Skin: warm and dry  Neuro:  CNs 2-12 intact, no focal abnormalities noted Psych:  Normal affect   EKG:  The ECG that was done 6/2 was personally reviewed and demonstrates sinus tachycardia with heart rate 114 bpm, biatrial enlargement, LVH, ST depression in V3-V5  Relevant CV Studies:   Laboratory Data: High Sensitivity Troponin:   Recent Labs  Lab 02/06/24 1538 02/06/24 1748  TROPONINIHS  19,818* 16,810*      Chemistry Recent Labs  Lab 02/06/24 1538 02/06/24 1603 02/07/24 0735  NA 139 142 143  K 4.0 4.6 4.2  CL 103 108 103  CO2 20*  --  25  GLUCOSE 137* 129* 128*  BUN 18 21* 24*  CREATININE 1.02 1.00 0.82  CALCIUM 10.0  --  9.4  GFRNONAA >60  --  >60  ANIONGAP 16*  --  15    Recent Labs  Lab 02/06/24 1538 02/07/24 0735  PROT 9.5* 8.7*  ALBUMIN 5.0 4.5  AST 198* 103*  ALT 53* 42  ALKPHOS 78 74  BILITOT 0.7 1.0   Lipids  Recent Labs  Lab 02/07/24 0726  CHOL 271*  TRIG 74  HDL 109  LDLCALC 147*  CHOLHDL 2.5   Hematology Recent Labs  Lab 02/06/24 1538 02/06/24 1603 02/07/24 0542  WBC 16.9*  --  16.4*  RBC 5.19  --  4.93  HGB 15.6 16.7 14.9  HCT 46.2 49.0 44.3  MCV 89.0  --  89.9  MCH 30.1  --  30.2  MCHC 33.8  --  33.6  RDW 13.2  --  13.3  PLT 303  --  291   Thyroid  No results for input(s): "TSH", "FREET4" in the last 168 hours. BNPNo results for input(s): "BNP", "PROBNP" in the last 168 hours.  DDimer No results for input(s): "DDIMER" in the last 168 hours.  Radiology/Studies:  DG Chest 1 View Result Date: 02/06/2024 CLINICAL DATA:  Nausea and vomiting. EXAM: CHEST  1 VIEW COMPARISON:  05/28/2023 FINDINGS: Chronic hyperinflated. The heart is normal in size. Mediastinal contours are stable. No evidence of pneumomediastinum. No focal airspace disease, pleural effusion or pneumothorax. No osseous abnormalities are seen. IMPRESSION: Chronic hyperinflation.  No acute findings. Electronically Signed   By: Chadwick Colonel M.D.   On: 02/06/2024 16:58     Assessment and Plan:  Late presenting NSTEMI Lactic Acidosis  - Patient reportedly had an episode of chest discomfort on Friday 5/30 around 1 AM.  He was resting and watching television when pain suddenly began.  Also had diaphoresis.  Pain last about 15 minutes then  resolved on its own.  He had a second episode of chest pain later that day that again lasted 15 minutes then resolved on its  own.  Denies having chest pain since these episodes. - High-sensitivity troponin 19,818> 16,810 - EKG with LVH, ST depression in V3-V5 - Patient given aspirin 324 mg in the ED and started on IV heparin.  Continue IV heparin - Plan for left heart catheterization today.  Discussed risk benefits, patient in agreement with proceeding - Echocardiogram pending - Lactic acid 3.1> 2.6. No symptoms of heart failure.  BP stable at 128/106, low suspicion for cardiogenic shock.  Urinalysis showed many bacteria, patient denies any urinary symptoms. Nausea/vomiting has resolved.  He denies fever, chills, body aches.  - Ordered repeat lactic acid to trend  Marijuana use - Patient admits to smoking marijuana and has history of cannabinoid hyperemesis syndrome - Needs counseling on marijuana avoidance  Nausea/vomiting - Resolved with IV fluids, Phenergan   Hyperlipidemia - Lipid panel this a.m. with LDL 147, HDL 109, triglycerides 74 - Started on Lipitor 80 mg daily this admission   Informed Consent   Shared Decision Making/Informed Consent{ The risks [stroke (1 in 1000), death (1 in 1000), kidney failure [usually temporary] (1 in 500), bleeding (1 in 200), allergic reaction [possibly serious] (1 in 200)], benefits (diagnostic support and management of coronary artery disease) and alternatives of a cardiac catheterization were discussed in detail with Mr. Mcgregor and he is willing to proceed.      Risk Assessment/Risk Scores:  TIMI Risk Score for Unstable Angina or Non-ST Elevation MI:   The patient's TIMI risk score is 2, which indicates a 8% risk of all cause mortality, new or recurrent myocardial infarction or need for urgent revascularization in the next 14 days.{      Code Status: Full Code  Severity of Illness: The appropriate patient status for this patient is INPATIENT. Inpatient status is judged to be reasonable and necessary in order to provide the required intensity of service to ensure  the patient's safety. The patient's presenting symptoms, physical exam findings, and initial radiographic and laboratory data in the context of their chronic comorbidities is felt to place them at high risk for further clinical deterioration. Furthermore, it is not anticipated that the patient will be medically stable for discharge from the hospital within 2 midnights of admission.   * I certify that at the point of admission it is my clinical judgment that the patient will require inpatient hospital care spanning beyond 2 midnights from the point of admission due to high intensity of service, high risk for further deterioration and high frequency of surveillance required.*  For questions or updates, please contact [facility] Please consult [email or web address] for contact info under     Signed, Debria Fang, PA-C  02/07/2024 8:25 AM

## 2024-02-07 NOTE — H&P (Signed)
 Mr. Swiss is a 58 year old with NSTEMI. [facility] was contacted by Dr. Reba Camper about transfer yesterday afternoon.   At ~06:30 AM, I was under the impression that Mr. Lasser was at [facility] in the ED. I placed bare bone admission orders to expedite admission as we were expecting his transfer last night for a LHC for NSTEMI in the AM. Once I arrived to the ED at 6:40, I was notified that Mr. Bentson remains at [facility] ED and that he has not transferred to [facility] due to lack of available beds. I contacted the physician and RN at [facility] ED to discuss Mr. [facility] care.   AM Recommendations:  - Begin ASA 81 mg  - Continue heparin  - Begin Atorvastatin - Keep NPO for cath upon transfer  - If lactate is elevated, please contact cardiology team.  - For elevated blood pressures, please give 12.5 mg of captopril    I requested for ED attending sign back in as primary attending covering the chart as the basic admitting orders were placed in error  Velvet Gibbs, MD MS Overnight Cardiology [employer]

## 2024-02-07 NOTE — Progress Notes (Signed)
 Received patient from [facility] ED via [facility]. Pt A&Ox4 and denies CP or nausea. Hep gtt running at 950u/hr. PIV x2 in place. Confirmed ASA given per [facility] and pt verbalized taking med.

## 2024-02-08 ENCOUNTER — Encounter (HOSPITAL_COMMUNITY): Payer: Self-pay | Admitting: Cardiology

## 2024-02-08 DIAGNOSIS — E7800 Pure hypercholesterolemia, unspecified: Secondary | ICD-10-CM

## 2024-02-08 DIAGNOSIS — I498 Other specified cardiac arrhythmias: Secondary | ICD-10-CM

## 2024-02-08 DIAGNOSIS — I10 Essential (primary) hypertension: Secondary | ICD-10-CM

## 2024-02-08 DIAGNOSIS — I214 Non-ST elevation (NSTEMI) myocardial infarction: Principal | ICD-10-CM

## 2024-02-08 LAB — BASIC METABOLIC PANEL WITH GFR
Anion gap: 13 (ref 5–15)
BUN: 22 mg/dL — ABNORMAL HIGH (ref 6–20)
CO2: 20 mmol/L — ABNORMAL LOW (ref 22–32)
Calcium: 8.8 mg/dL — ABNORMAL LOW (ref 8.9–10.3)
Chloride: 107 mmol/L (ref 98–111)
Creatinine, Ser: 0.88 mg/dL (ref 0.61–1.24)
GFR, Estimated: >60 mL/min (ref >60)
Glucose, Bld: 127 mg/dL — ABNORMAL HIGH (ref 70–99)
Potassium: 3.6 mmol/L (ref 3.5–5.1)
Sodium: 140 mmol/L (ref 135–145)

## 2024-02-08 LAB — CBC
HCT: 41.5 % (ref 39.0–52.0)
Hemoglobin: 14.1 g/dL (ref 13.0–17.0)
MCH: 30.1 pg (ref 26.0–34.0)
MCHC: 34 g/dL (ref 30.0–36.0)
MCV: 88.5 fL (ref 80.0–100.0)
Platelets: 256 10*3/uL (ref 150–400)
RBC: 4.69 MIL/uL (ref 4.22–5.81)
RDW: 13.2 % (ref 11.5–15.5)
WBC: 17 10*3/uL — ABNORMAL HIGH (ref 4.0–10.5)
nRBC: 0 % (ref 0.0–0.2)

## 2024-02-08 MED ORDER — METOPROLOL SUCCINATE ER 25 MG PO TB24: 25.0000 mg | ORAL_TABLET | Freq: Every day | ORAL | Status: DC

## 2024-02-08 MED ORDER — PANTOPRAZOLE SODIUM 40 MG PO TBEC
40.0000 mg | DELAYED_RELEASE_TABLET | Freq: Every day | ORAL | Status: DC
  Administered 2024-02-08 – 2024-02-10 (×3): 40 mg via ORAL
  Filled 2024-02-08 (×3): qty 1

## 2024-02-08 MED ORDER — METOPROLOL TARTRATE 25 MG PO TABS
25.0000 mg | ORAL_TABLET | Freq: Two times a day (BID) | ORAL | Status: DC
  Administered 2024-02-08: 25 mg via ORAL
  Filled 2024-02-08: qty 1

## 2024-02-08 MED ORDER — LOSARTAN POTASSIUM 25 MG PO TABS
12.5000 mg | ORAL_TABLET | Freq: Every day | ORAL | Status: DC
  Administered 2024-02-08: 12.5 mg via ORAL
  Filled 2024-02-08: qty 1

## 2024-02-08 NOTE — Progress Notes (Deleted)
 PHARMACY - ANTICOAGULATION CONSULT NOTE  Pharmacy Consult for heparin  Indication: chest pain/ACS  No Known Allergies  Patient Measurements:  Height: 6' (182.9 cm) Weight: 53.8 kg (118 lb 9.7 oz) (not sure if accurate) IBW/kg (Calculated) : 77.6 HEPARIN DW (KG): 53.8  Vital Signs: Temp: 98.9 F (37.2 C) (06/04 0000) Temp Source: Oral (06/04 0000) BP: 134/102 (06/04 0100) Pulse Rate: 107 (06/04 0100)  Labs: Recent Labs    02/06/24 1538 02/06/24 1603 02/06/24 1748 02/06/24 2333 02/07/24 0542 02/07/24 0735 02/07/24 2123  HGB 15.6 16.7  --   --  14.9  --   --   HCT 46.2 49.0  --   --  44.3  --   --   PLT 303  --   --   --  291  --   --   HEPARINUNFRC  --   --   --  0.36 0.20*  --  0.59  CREATININE 1.02 1.00  --   --   --  0.82  --   TROPONINIHS 54,098*  --  16,810*  --   --   --   --     Estimated Creatinine Clearance: 75.6 mL/min (by C-G formula based on SCr of 0.82 mg/dL).   Medical History: Past Medical History:  Diagnosis Date   Acute kidney injury (HCC) 04/13/2017   related to "dehydration" (04/14/2017)   Blood transfusion without reported diagnosis    Cannabinoid hyperemesis syndrome    Esophageal tear    Gastric ulcer    Gastritis with intestinal metaplasia of stomach    Gastroparesis    H. pylori infection    Hypokalemia    Malnutrition of moderate degree (Gomez: 60% to less than 75% of standard weight) (HCC)    Posthemorrhagic anemia    Upper GI bleed     Assessment: Patient is a 58 y.o M who presented to the ED on 02/06/24 with c/o n/v and CP.  Troponin was found to be elevated.  Pharmacy has been consulted to dose heparin drip for ACS.  Today, 02/08/2024: Heparin level SUBtherapeutic on current heparin gtt @ 800 units/hr CBC WNL No bleeding complications reported  6/3 PM update: HL 0.59 (resulted during transferred to [facility] from [facility])  Goal of Therapy:  Heparin level 0.3-0.7 units/ml Monitor platelets by anticoagulation protocol: Yes   Plan:  -  Continue heparin infusion at 950 units/hr - confirm HL w/ AM labs 6/4 - Daily heparin level & CBC - monitor for s/sx bleeding   Marleta Simmer BS, PharmD, BCPS Clinical Pharmacist 02/08/2024 1:19 AM  Contact: [phone] after 3 PM  "Be curious, not judgmental..." -Rumalda Counter

## 2024-02-08 NOTE — Progress Notes (Signed)
   02/08/24 1600  Spiritual Encounters  Type of Visit Initial  Care provided to: Patient  Referral source Patient request  Reason for visit Advance directives  OnCall Visit Yes   Chaplain responded to Spiritual Consult for Advance Care Directive.  Chaplain arrived in room and delivered ACD education. Instructed Pt how to reach out to Spiritual Care if they have any questions and to contact us  when they are ready to move forward.  Chaplain services remain available as the need arises by Spiritual Consult or for emergent cases, paging [phone].  Chaplain Jalah Warmuth, LPN, CHC, ORDM

## 2024-02-08 NOTE — Progress Notes (Signed)
 Rounding Note   Patient Name: Lee Greer Date of Encounter: 02/08/2024  [facility] Cardiologist: None new   Subjective Feels great today. No chest pain or dyspnea.   Scheduled Meds:  aspirin EC  81 mg Oral Daily   atorvastatin  80 mg Oral Daily   Chlorhexidine Gluconate Cloth  6 each Topical Daily   metoprolol tartrate  25 mg Oral BID   prasugrel  10 mg Oral Daily   sodium chloride  flush  3 mL Intravenous Q12H   Continuous Infusions:  sodium chloride      promethazine  (PHENERGAN ) injection (IM or IVPB) Stopped (02/06/24 2308)   PRN Meds: sodium chloride , acetaminophen , promethazine  (PHENERGAN ) injection (IM or IVPB), sodium chloride  flush   Vital Signs  Vitals:   02/08/24 0400 02/08/24 0500 02/08/24 0600 02/08/24 0700  BP: (!) 125/93 (!) 126/98 (!) 130/105 124/78  Pulse: (!) 102 (!) 112 (!) 120 (!) 125  Resp: 17 (!) 22 20 (!) 23  Temp: 98.8 F (37.1 C)   (!) 97.4 F (36.3 C)  TempSrc: Oral   Axillary  SpO2: 100% 100% 100% 100%  Weight:      Height:        Intake/Output Summary (Last 24 hours) at 02/08/2024 0823 Last data filed at 02/08/2024 1610 Gross per 24 hour  Intake 1470.14 ml  Output 1025 ml  Net 445.14 ml      02/07/2024    7:48 PM 02/06/2024    5:36 PM 02/06/2024    4:30 PM  Last 3 Weights  Weight (lbs) 118 lb 9.7 oz 140 lb 147 lb 11.3 oz  Weight (kg) 53.8 kg 63.504 kg 67 kg      Telemetry Developed accelerated junctional rhythm at 11 pm. Persistent. Rate about 120 - Personally Reviewed  ECG  Accelerated junctional rhythm rate 120. ST depression V3-6. Incomplete RBBB - Personally Reviewed  Physical Exam  GEN: No acute distress.   Neck: No JVD Cardiac: RRR, no murmurs, rubs, or gallops.  Respiratory: Clear to auscultation bilaterally. GI: Soft, nontender, non-distended  MS: No edema; No deformity. Radial site looks good.  Neuro:  Nonfocal  Psych: Normal affect   Labs High Sensitivity Troponin:   Recent Labs  Lab  02/06/24 1538 02/06/24 1748  TROPONINIHS 19,818* 16,810*     Chemistry Recent Labs  Lab 02/06/24 1538 02/06/24 1603 02/07/24 0735  NA 139 142 143  K 4.0 4.6 4.2  CL 103 108 103  CO2 20*  --  25  GLUCOSE 137* 129* 128*  BUN 18 21* 24*  CREATININE 1.02 1.00 0.82  CALCIUM 10.0  --  9.4  PROT 9.5*  --  8.7*  ALBUMIN 5.0  --  4.5  AST 198*  --  103*  ALT 53*  --  42  ALKPHOS 78  --  74  BILITOT 0.7  --  1.0  GFRNONAA >60  --  >60  ANIONGAP 16*  --  15    Lipids  Recent Labs  Lab 02/07/24 0726  CHOL 271*  TRIG 74  HDL 109  LDLCALC 147*  CHOLHDL 2.5    Hematology Recent Labs  Lab 02/06/24 1538 02/06/24 1603 02/07/24 0542 02/08/24 0219  WBC 16.9*  --  16.4* 17.0*  RBC 5.19  --  4.93 4.69  HGB 15.6 16.7 14.9 14.1  HCT 46.2 49.0 44.3 41.5  MCV 89.0  --  89.9 88.5  MCH 30.1  --  30.2 30.1  MCHC 33.8  --  33.6 34.0  RDW 13.2  --  13.3 13.2  PLT 303  --  291 256   Thyroid  No results for input(s): "TSH", "FREET4" in the last 168 hours.  BNPNo results for input(s): "BNP", "PROBNP" in the last 168 hours.  DDimer No results for input(s): "DDIMER" in the last 168 hours.   Radiology  CARDIAC CATHETERIZATION Result Date: 02/08/2024 Images from the original result were not included. Coronary angiography & intervention 02/07/2024: LM: Normal LAD: Mid 40%, 30% stenoses Lcx: Prox thrombotic 95% stenosis extending into OM1 and mid LCx RCA: Prox 80% stenosis LVEDP 2 mmHg Successful percutaneous coronary intervention Lcx/OM        PTCA and stent placement OM1 2.5 X 48 mm Synergy drug-eluting stent           Post dilated up with [place] balloons up to 4.0 X 15 mm at 16 atm        PTCA LCx 2.5 X 15 mm balloon at 8 atm Unusually long and challenging procedure due to use of destination sheath, abrupt vessel closure and dissection requiring use of multiple wires, bifurcation stenting with provisional stenting approach. Contrast used: 240 cc Will need await recovery from this procedure and  reassessment of renal function tomorrow before decision on addressing prox RCA severe lesion. Cody Das, MD   ECHOCARDIOGRAM COMPLETE Result Date: 02/07/2024    ECHOCARDIOGRAM REPORT   Patient Name:   Lee Greer Date of Exam: 02/07/2024 Medical Rec #:  [id number]         Height:       72.0 in Accession #:    [id number]        Weight:       140.0 lb Date of Birth:  1966-03-08         BSA:          1.831 m Patient Age:    58 years          BP:           132/111 mmHg Patient Gender: M                 HR:           101 bpm. Exam Location:  Inpatient Procedure: 2D Echo, Cardiac Doppler, Color Doppler and Intracardiac            Opacification Agent (Both Spectral and Color Flow Doppler were            utilized during procedure). Indications:    NSTEMI  History:        Patient has no prior history of Echocardiogram examinations.  Sonographer:    Janette Medley Referring Phys: [id number] NKIRU C OSUDE IMPRESSIONS  1. Findings consistent with ischemia/infarction in the right coronary artery territory, with compensatory hyperdynamic contractility in other wall segments. Left ventricular ejection fraction, by estimation, is 45 to 50%. The left ventricle has mildly decreased function. The left ventricle demonstrates regional wall motion abnormalities (see scoring diagram/findings for description). Left ventricular diastolic parameters were normal. Elevated left ventricular end-diastolic pressure. There is severe hypokinesis of the left ventricular, basal-mid inferior wall, inferolateral wall and inferoseptal wall.  2. Right ventricular systolic function is mildly reduced. The right ventricular size is normal. Tricuspid regurgitation signal is inadequate for assessing PA pressure.  3. The mitral valve is normal in structure. Mild to moderate mitral valve regurgitation. No evidence of mitral stenosis.  4. The aortic valve is tricuspid. Aortic valve regurgitation is not visualized. No aortic stenosis is present.  5. The  inferior vena cava is normal in size with greater than 50% respiratory variability, suggesting right atrial pressure of 3 mmHg. FINDINGS  Left Ventricle: Findings consistent with ischemia/infarction in the right coronary artery territory, with compensatory hyperdynamic contractility in other wall segments. Left ventricular ejection fraction, by estimation, is 45 to 50%. The left ventricle  has mildly decreased function. The left ventricle demonstrates regional wall motion abnormalities. Severe hypokinesis of the left ventricular, basal-mid inferior wall, inferolateral wall and inferoseptal wall. The left ventricular internal cavity size was normal in size. There is no left ventricular hypertrophy. Left ventricular diastolic parameters were normal. Elevated left ventricular end-diastolic pressure.  LV Wall Scoring: The inferior wall, posterior wall, mid inferoseptal segment, and basal inferoseptal segment are hypokinetic. Right Ventricle: The right ventricular size is normal. No increase in right ventricular wall thickness. Right ventricular systolic function is mildly reduced. Tricuspid regurgitation signal is inadequate for assessing PA pressure. Left Atrium: Left atrial size was normal in size. Right Atrium: Right atrial size was normal in size. Pericardium: There is no evidence of pericardial effusion. Mitral Valve: The mitral valve is normal in structure. Mild to moderate mitral valve regurgitation, with centrally-directed jet. No evidence of mitral valve stenosis. Tricuspid Valve: The tricuspid valve is normal in structure. Tricuspid valve regurgitation is not demonstrated. No evidence of tricuspid stenosis. Aortic Valve: The aortic valve is tricuspid. Aortic valve regurgitation is not visualized. No aortic stenosis is present. Pulmonic Valve: The pulmonic valve was not well visualized. Pulmonic valve regurgitation is not visualized. No evidence of pulmonic stenosis. Aorta: The aortic root is normal in size and  structure. Venous: The inferior vena cava is normal in size with greater than 50% respiratory variability, suggesting right atrial pressure of 3 mmHg. IAS/Shunts: No atrial level shunt detected by color flow Doppler.  LEFT VENTRICLE PLAX 2D LVIDd:         4.00 cm   Diastology LVIDs:         2.90 cm   LV e' medial:    7.62 cm/s LV PW:         0.90 cm   LV E/e' medial:  8.6 LV IVS:        0.90 cm   LV e' lateral:   9.46 cm/s LVOT diam:     2.20 cm   LV E/e' lateral: 6.9 LV SV:         32 LV SV Index:   17 LVOT Area:     3.80 cm  RIGHT VENTRICLE            IVC RV S prime:     8.38 cm/s  IVC diam: 1.50 cm TAPSE (M-mode): 2.0 cm LEFT ATRIUM             Index       RIGHT ATRIUM          Index LA diam:        2.10 cm 1.15 cm/m  RA Area:     5.37 cm LA Vol (A2C):   10.1 ml 5.52 ml/m  RA Volume:   7.86 ml  4.29 ml/m LA Vol (A4C):   13.2 ml 7.21 ml/m LA Biplane Vol: 11.5 ml 6.28 ml/m  AORTIC VALVE LVOT Vmax:   63.90 cm/s LVOT Vmean:  41.900 cm/s LVOT VTI:    0.083 m  AORTA Ao Root diam: 3.00 cm MITRAL VALVE MV Area (PHT): 4.77 cm    SHUNTS MV Decel Time: 159 msec  Systemic VTI:  0.08 m MV E velocity: 65.50 cm/s  Systemic Diam: 2.20 cm Karyl Paget Croitoru MD Electronically signed by Luana Rumple MD Signature Date/Time: 02/07/2024/12:39:31 PM    Final    DG Chest 1 View Result Date: 02/06/2024 CLINICAL DATA:  Nausea and vomiting. EXAM: CHEST  1 VIEW COMPARISON:  05/28/2023 FINDINGS: Chronic hyperinflated. The heart is normal in size. Mediastinal contours are stable. No evidence of pneumomediastinum. No focal airspace disease, pleural effusion or pneumothorax. No osseous abnormalities are seen. IMPRESSION: Chronic hyperinflation.  No acute findings. Electronically Signed   By: Chadwick Colonel M.D.   On: 02/06/2024 16:58    Cardiac Studies Echo: IMPRESSIONS     1. Findings consistent with ischemia/infarction in the right coronary  artery territory, with compensatory hyperdynamic contractility in other  wall  segments. Left ventricular ejection fraction, by estimation, is 45 to  50%. The left ventricle has mildly  decreased function. The left ventricle demonstrates regional wall motion  abnormalities (see scoring diagram/findings for description). Left  ventricular diastolic parameters were normal. Elevated left ventricular  end-diastolic pressure. There is severe  hypokinesis of the left ventricular, basal-mid inferior wall,  inferolateral wall and inferoseptal wall.   2. Right ventricular systolic function is mildly reduced. The right  ventricular size is normal. Tricuspid regurgitation signal is inadequate  for assessing PA pressure.   3. The mitral valve is normal in structure. Mild to moderate mitral valve  regurgitation. No evidence of mitral stenosis.   4. The aortic valve is tricuspid. Aortic valve regurgitation is not  visualized. No aortic stenosis is present.   5. The inferior vena cava is normal in size with greater than 50%  respiratory variability, suggesting right atrial pressure of 3 mmHg.   LEFT HEART CATH AND CORONARY ANGIOGRAPHY  CORONARY STENT INTERVENTION  Coronary Ultrasound/IVUS   Conclusion  Coronary angiography & intervention 02/07/2024: LM: Normal LAD: Mid 40%, 30% stenoses Lcx: Prox thrombotic 95% stenosis extending into OM1 and mid LCx RCA: Prox 80% stenosis   LVEDP 2 mmHg   Successful percutaneous coronary intervention Lcx/OM        PTCA and stent placement OM1 2.5 X 48 mm Synergy drug-eluting stent           Post dilated up with [place] balloons up to 4.0 X 15 mm at 16 atm        PTCA LCx 2.5 X 15 mm balloon at 8 atm      Patient Profile   58 y.o. male with HTN, HLD untreated presents with NSTEMI  Assessment & Plan  NSTEMI. Peak troponin 19,818. Ecg with ST depression. EF mildly reduced 45-50% with inferior HK. Cardiac cath with severe 2 vessel disease. Culprit lesion in LCx with thrombus. S/p PCI with DES x 2. Procedure complicated but abrupt vessel  closure. Ended up with an excellent result. 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 Plan DAPT with ASA and Effient for one year. Would recommend staged PCI of the RCA while here. Awaiting renal profile today. Would anticipate tomorrow if stable. Will initiate beta blocker. If renal function ok start ARB. On high dose statin Accelerated junctional rhythm. Rate 120. Persistent. Asymptomatic. Will start metoprolol 25 mg bid and monitor LV dysfunction. Hopefully will improve with revascularization. Will start metoprolol and ARB.  HLD. LDL 147. Now on high dose statin HTN. Stable. Will stop hydralazine and add beta blocker and ARB for now.  N/V. Resolved.  Will add protonix .   For questions or updates, please contact [facility] Please consult [email or web address] for contact info under     Signed, Sollie Vultaggio [place], MD  02/08/2024, 8:23 AM

## 2024-02-08 NOTE — TOC Initial Note (Addendum)
 Transition of Care [facility]) - Initial/Assessment Note    Patient Details  Name: Lee Greer MRN: [id number] Date of Birth: 08-17-1966  [facility] CM/SW Contact:    Cosimo Diones, RN Phone Number: 02/08/2024, 1:45 PM  Clinical Narrative:  Patient presented for  chest pain-Nstemi as a transfer from [facility]. PTA patient states he was from home with the support of his significant other. Patient has PCP at the [facility] and [facility]. Patient is without insurance-information sent to the Financial Counselor to see if he is eligible for Medicaid. Case Manager will follow for [facility] assistance for medication as the patient progresses.      1401 02-08-24 FC states patient is over the income level for Medicaid Eligibility. No further needs at this time.            Expected Discharge Plan: Home/Self Care Barriers to Discharge: Continued Medical Work up   Patient Goals and CMS Choice Patient states their goals for this hospitalization and ongoing recovery are:: plan to return home once stable   Expected Discharge Plan and Services In-house Referral: Financial Counselor Discharge Planning Services: CM Consult Post Acute Care Choice: NA Living arrangements for the past 2 months: Single Family Home    HH Arranged: NA  Prior Living Arrangements/Services Living arrangements for the past 2 months: Single Family Home Lives with:: Significant Other Patient language and need for interpreter reviewed:: Yes Do you feel safe going back to the place where you live?: Yes      Need for Family Participation in Patient Care: No (Comment) Care giver support system in place?: No (comment)   Criminal Activity/Legal Involvement Pertinent to Current Situation/Hospitalization: No - Comment as needed  Activities of Daily Living   ADL Screening (condition at time of admission) Independently performs ADLs?: Yes (appropriate for developmental age) Is the patient deaf or have  difficulty hearing?: No Does the patient have difficulty seeing, even when wearing glasses/contacts?: No Does the patient have difficulty concentrating, remembering, or making decisions?: No  Permission Sought/Granted Permission sought to share information with : Family Supports, Case Manager   Emotional Assessment Appearance:: Appears stated age Attitude/Demeanor/Rapport: Engaged Affect (typically observed): Appropriate Orientation: : Oriented to Self, Oriented to Place, Oriented to  Time, Oriented to Situation Alcohol / Substance Use: Not Applicable Psych Involvement: No (comment)  Admission diagnosis:  Nausea [R11.0] Elevated troponin [R79.89] NSTEMI (non-ST elevated myocardial infarction) (HCC) [I21.4] Patient Active Problem List   Diagnosis Date Noted   Non-ST elevation (NSTEMI) myocardial infarction (HCC) 02/07/2024   Elevated troponin 02/06/2024   Upper GI bleed 10/04/2018   Acute GI bleeding    Anemia, posthemorrhagic, acute    Acute gastric ulcer with hemorrhage    Acute gastritis without hemorrhage    UGI bleed 10/03/2018   Diabetes mellitus without complication (HCC) 10/03/2018   Hypokalemia 10/03/2018   Hematemesis 09/26/2018   UGIB (upper gastrointestinal bleed) 09/26/2018   AKI (acute kidney injury) (HCC) 04/13/2017   Dehydration 04/13/2017   Nausea with vomiting 02/17/2015   PCP:  Collins Dean, NP Pharmacy:   [phone] - [place], [place] - [address] AT [facility] [address] [place] [address] Phone: [phone] Fax: [phone]  [facility] [address] [place] [address] Phone: [phone] Fax: [phone]  Social Drivers of Health (SDOH) Social History: SDOH Screenings   Food Insecurity: No Food Insecurity (02/07/2024)  Housing: Low Risk  (02/07/2024)  Transportation Needs: No Transportation Needs (02/07/2024)  Utilities: Not At Risk (02/07/2024)   Depression (PHQ2-9): Low Risk  (05/24/2023)  Tobacco Use: Low Risk  (02/06/2024)   Readmission Risk Interventions     No data to display

## 2024-02-08 NOTE — Plan of Care (Signed)
  Problem: Activity: Goal: Ability to tolerate increased activity will improve Outcome: Progressing   Problem: Cardiac: Goal: Ability to achieve and maintain adequate cardiovascular perfusion will improve Outcome: Progressing   Problem: Education: Goal: Understanding of CV disease, CV risk reduction, and recovery process will improve Outcome: Progressing Goal: Individualized Educational Video(s) Outcome: Progressing

## 2024-02-09 ENCOUNTER — Encounter (HOSPITAL_COMMUNITY)
Admission: EM | Disposition: A | Discharge status: Home / Self Care | Payer: Self-pay | Source: Home / Self Care | Attending: Internal Medicine

## 2024-02-09 ENCOUNTER — Encounter (HOSPITAL_COMMUNITY): Payer: Self-pay | Admitting: Cardiology

## 2024-02-09 HISTORY — PX: CORONARY STENT INTERVENTION: CATH118234

## 2024-02-09 LAB — CBC
HCT: 36 % — ABNORMAL LOW (ref 39.0–52.0)
HCT: 38.3 % — ABNORMAL LOW (ref 39.0–52.0)
Hemoglobin: 12.2 g/dL — ABNORMAL LOW (ref 13.0–17.0)
Hemoglobin: 12.9 g/dL — ABNORMAL LOW (ref 13.0–17.0)
MCH: 31 pg (ref 26.0–34.0)
MCH: 29.9 pg (ref 26.0–34.0)
MCHC: 33.9 g/dL (ref 30.0–36.0)
MCHC: 33.7 g/dL (ref 30.0–36.0)
MCV: 91.4 fL (ref 80.0–100.0)
MCV: 88.7 fL (ref 80.0–100.0)
Platelets: 204 10*3/uL (ref 150–400)
Platelets: 228 10*3/uL (ref 150–400)
RBC: 3.94 MIL/uL — ABNORMAL LOW (ref 4.22–5.81)
RBC: 4.32 MIL/uL (ref 4.22–5.81)
RDW: 13.2 % (ref 11.5–15.5)
RDW: 12.8 % (ref 11.5–15.5)
WBC: 13.2 10*3/uL — ABNORMAL HIGH (ref 4.0–10.5)
WBC: 11.4 10*3/uL — ABNORMAL HIGH (ref 4.0–10.5)
nRBC: 0 % (ref 0.0–0.2)
nRBC: 0 % (ref 0.0–0.2)

## 2024-02-09 LAB — BASIC METABOLIC PANEL WITH GFR
Anion gap: 8 (ref 5–15)
BUN: 25 mg/dL — ABNORMAL HIGH (ref 6–20)
CO2: 25 mmol/L (ref 22–32)
Calcium: 8.6 mg/dL — ABNORMAL LOW (ref 8.9–10.3)
Chloride: 106 mmol/L (ref 98–111)
Creatinine, Ser: 0.92 mg/dL (ref 0.61–1.24)
GFR, Estimated: >60 mL/min (ref >60)
Glucose, Bld: 106 mg/dL — ABNORMAL HIGH (ref 70–99)
Potassium: 3.5 mmol/L (ref 3.5–5.1)
Sodium: 139 mmol/L (ref 135–145)

## 2024-02-09 LAB — CREATININE, SERUM
Creatinine, Ser: 0.97 mg/dL (ref 0.61–1.24)
GFR, Estimated: >60 mL/min (ref >60)

## 2024-02-09 LAB — POCT ACTIVATED CLOTTING TIME: Activated Clotting Time: 291 s

## 2024-02-09 LAB — LIPOPROTEIN A (LPA): Lipoprotein (a): 233.2 nmol/L — ABNORMAL HIGH

## 2024-02-09 LAB — LACTIC ACID, PLASMA: Lactic Acid, Venous: 1 mmol/L (ref 0.5–1.9)

## 2024-02-09 SURGERY — CORONARY STENT INTERVENTION
Anesthesia: LOCAL

## 2024-02-09 MED ORDER — HEPARIN SODIUM (PORCINE) 1000 UNIT/ML IJ SOLN
INTRAMUSCULAR | Status: AC
  Filled 2024-02-09: qty 10

## 2024-02-09 MED ORDER — FENTANYL CITRATE (PF) 100 MCG/2ML IJ SOLN
INTRAMUSCULAR | Status: AC
  Filled 2024-02-09: qty 2

## 2024-02-09 MED ORDER — FENTANYL CITRATE (PF) 100 MCG/2ML IJ SOLN
INTRAMUSCULAR | Status: DC | PRN
  Administered 2024-02-09: 50 ug via INTRAVENOUS

## 2024-02-09 MED ORDER — SODIUM CHLORIDE 0.9% FLUSH: 3.0000 mL | INTRAVENOUS | Status: DC | PRN

## 2024-02-09 MED ORDER — SODIUM CHLORIDE 0.9% FLUSH
3.0000 mL | Freq: Two times a day (BID) | INTRAVENOUS | Status: DC
  Administered 2024-02-09 – 2024-02-10 (×2): 3 mL via INTRAVENOUS

## 2024-02-09 MED ORDER — HYDRALAZINE HCL 20 MG/ML IJ SOLN: 10.0000 mg | INTRAMUSCULAR | Status: AC | PRN

## 2024-02-09 MED ORDER — VERAPAMIL HCL 2.5 MG/ML IV SOLN
INTRAVENOUS | Status: AC
Start: 2024-02-09 — End: ?
  Filled 2024-02-09: qty 2

## 2024-02-09 MED ORDER — LIDOCAINE HCL (PF) 1 % IJ SOLN
INTRAMUSCULAR | Status: AC
Start: 2024-02-09 — End: ?
  Filled 2024-02-09: qty 30

## 2024-02-09 MED ORDER — MIDAZOLAM HCL 2 MG/2ML IJ SOLN
INTRAMUSCULAR | Status: DC | PRN
Start: 2024-02-09 — End: 2024-02-09
  Administered 2024-02-09: 1 mg via INTRAVENOUS

## 2024-02-09 MED ORDER — SODIUM CHLORIDE 0.9 % IV SOLN: INTRAVENOUS | Status: AC

## 2024-02-09 MED ORDER — LIDOCAINE HCL (PF) 1 % IJ SOLN
INTRAMUSCULAR | Status: DC | PRN
  Administered 2024-02-09: 2 mL

## 2024-02-09 MED ORDER — SODIUM CHLORIDE 0.9 % IV SOLN
INTRAVENOUS | Status: DC
Start: 2024-02-09 — End: 2024-02-09

## 2024-02-09 MED ORDER — HEPARIN SODIUM (PORCINE) 5000 UNIT/ML IJ SOLN
5000.0000 [IU] | Freq: Three times a day (TID) | INTRAMUSCULAR | Status: DC
  Administered 2024-02-09 – 2024-02-10 (×2): 5000 [IU] via SUBCUTANEOUS
  Filled 2024-02-09: qty 1

## 2024-02-09 MED ORDER — NITROGLYCERIN 1 MG/10 ML FOR IR/CATH LAB
INTRA_ARTERIAL | Status: AC
  Filled 2024-02-09: qty 10

## 2024-02-09 MED ORDER — HEPARIN SODIUM (PORCINE) 1000 UNIT/ML IJ SOLN
INTRAMUSCULAR | Status: DC | PRN
Start: 2024-02-09 — End: 2024-02-09
  Administered 2024-02-09: 6000 [IU] via INTRAVENOUS

## 2024-02-09 MED ORDER — SODIUM CHLORIDE 0.9 % IV SOLN: 250.0000 mL | INTRAVENOUS | Status: DC | PRN

## 2024-02-09 MED ORDER — IOHEXOL 350 MG/ML SOLN
INTRAVENOUS | Status: DC | PRN
  Administered 2024-02-09: 25 mL

## 2024-02-09 MED ORDER — MIDAZOLAM HCL 2 MG/2ML IJ SOLN
INTRAMUSCULAR | Status: AC
Start: 2024-02-09 — End: ?
  Filled 2024-02-09: qty 2

## 2024-02-09 MED ORDER — VERAPAMIL HCL 2.5 MG/ML IV SOLN
INTRAVENOUS | Status: DC | PRN
  Administered 2024-02-09: 10 mL via INTRA_ARTERIAL

## 2024-02-09 MED ORDER — HEPARIN (PORCINE) IN NACL 1000-0.9 UT/500ML-% IV SOLN
INTRAVENOUS | Status: DC | PRN
  Administered 2024-02-09 (×2): 500 mL

## 2024-02-09 MED ORDER — LABETALOL HCL 5 MG/ML IV SOLN: 10.0000 mg | INTRAVENOUS | Status: AC | PRN

## 2024-02-09 SURGICAL SUPPLY — 15 items
BALLOON EMERGE MR 2.5X15 (BALLOONS) IMPLANT
BALLOON ~~LOC~~ EMERGE MR 3.25X12 (BALLOONS) IMPLANT
CATH INFINITI JR4 5F (CATHETERS) IMPLANT
CATH VISTA GUIDE 6FR AR1 MULPK (CATHETERS) IMPLANT
DEVICE RAD COMP TR BAND LRG (VASCULAR PRODUCTS) IMPLANT
GLIDESHEATH SLEND A-KIT 6F 22G (SHEATH) IMPLANT
GUIDEWIRE INQWIRE 1.5J.035X260 (WIRE) IMPLANT
KIT ENCORE 26 ADVANTAGE (KITS) IMPLANT
KIT HEMO VALVE WATCHDOG (MISCELLANEOUS) IMPLANT
PACK CARDIAC CATHETERIZATION (CUSTOM PROCEDURE TRAY) ×1 IMPLANT
SET ATX-X65L (MISCELLANEOUS) IMPLANT
SHEATH 6FR 85 DEST SLENDER (SHEATH) IMPLANT
SHEATH PROBE COVER 6X72 (BAG) IMPLANT
STENT SYNERGY XD 3.0X20 (Permanent Stent) IMPLANT
WIRE ASAHI PROWATER 180CM (WIRE) IMPLANT

## 2024-02-09 NOTE — Progress Notes (Signed)
 Rounding Note   Patient Name: Lee Greer Date of Encounter: 02/09/2024  [facility] Cardiologist: None new   Subjective Complains of anxiety with upcoming procedure. N/V with dry heaves this am. No chest pain.   Scheduled Meds:  aspirin EC  81 mg Oral Daily   atorvastatin  80 mg Oral Daily   Chlorhexidine Gluconate Cloth  6 each Topical Daily   pantoprazole   40 mg Oral Daily   prasugrel  10 mg Oral Daily   sodium chloride  flush  3 mL Intravenous Q12H   Continuous Infusions:  sodium chloride  50 mL/hr at 02/09/24 0647   promethazine  (PHENERGAN ) injection (IM or IVPB) Stopped (02/06/24 2308)   PRN Meds: acetaminophen , promethazine  (PHENERGAN ) injection (IM or IVPB), sodium chloride  flush   Vital Signs  Vitals:   02/09/24 0500 02/09/24 0559 02/09/24 0600 02/09/24 0700  BP: (!) 122/107  121/73 (!) 146/75  Pulse: 77  78 78  Resp: 16  13 14   Temp:      TempSrc:      SpO2: 99%  98% 98%  Weight:  52.7 kg    Height:        Intake/Output Summary (Last 24 hours) at 02/09/2024 0705 Last data filed at 02/09/2024 0647 Gross per 24 hour  Intake 390.43 ml  Output 150 ml  Net 240.43 ml      02/09/2024    5:59 AM 02/07/2024    7:48 PM 02/06/2024    5:36 PM  Last 3 Weights  Weight (lbs) 116 lb 2.9 oz 118 lb 9.7 oz 140 lb  Weight (kg) 52.7 kg 53.8 kg 63.504 kg      Telemetry NSR. Accelerated junctional rhythm resolved yesterday am. Personally Reviewed  ECG  None today  Physical Exam  GEN: No acute distress.   Neck: No JVD Cardiac: RRR, no murmurs, rubs, or gallops.  Respiratory: Clear to auscultation bilaterally. GI: Soft, nontender, non-distended  MS: No edema; No deformity. Radial site looks good.  Neuro:  Nonfocal  Psych: Normal affect   Labs High Sensitivity Troponin:   Recent Labs  Lab 02/06/24 1538 02/06/24 1748  TROPONINIHS 19,818* 16,810*     Chemistry Recent Labs  Lab 02/06/24 1538 02/06/24 1603 02/07/24 0735 02/08/24 0825  02/09/24 0346  NA 139   < > 143 140 139  K 4.0   < > 4.2 3.6 3.5  CL 103   < > 103 107 106  CO2 20*  --  25 20* 25  GLUCOSE 137*   < > 128* 127* 106*  BUN 18   < > 24* 22* 25*  CREATININE 1.02   < > 0.82 0.88 0.92  CALCIUM 10.0  --  9.4 8.8* 8.6*  PROT 9.5*  --  8.7*  --   --   ALBUMIN 5.0  --  4.5  --   --   AST 198*  --  103*  --   --   ALT 53*  --  42  --   --   ALKPHOS 78  --  74  --   --   BILITOT 0.7  --  1.0  --   --   GFRNONAA >60  --  >60 >60 >60  ANIONGAP 16*  --  15 13 8    < > = values in this interval not displayed.    Lipids  Recent Labs  Lab 02/07/24 0726  CHOL 271*  TRIG 74  HDL 109  LDLCALC 147*  CHOLHDL 2.5  Hematology Recent Labs  Lab 02/07/24 0542 02/08/24 0219 02/09/24 0346  WBC 16.4* 17.0* 13.2*  RBC 4.93 4.69 3.94*  HGB 14.9 14.1 12.2*  HCT 44.3 41.5 36.0*  MCV 89.9 88.5 91.4  MCH 30.2 30.1 31.0  MCHC 33.6 34.0 33.9  RDW 13.3 13.2 13.2  PLT 291 256 204   Thyroid  No results for input(s): "TSH", "FREET4" in the last 168 hours.  BNPNo results for input(s): "BNP", "PROBNP" in the last 168 hours.  DDimer No results for input(s): "DDIMER" in the last 168 hours.   Radiology  CARDIAC CATHETERIZATION Result Date: 02/08/2024 Images from the original result were not included. Coronary angiography & intervention 02/07/2024: LM: Normal LAD: Mid 40%, 30% stenoses Lcx: Prox thrombotic 95% stenosis extending into OM1 and mid LCx RCA: Prox 80% stenosis LVEDP 2 mmHg Successful percutaneous coronary intervention Lcx/OM        PTCA and stent placement OM1 2.5 X 48 mm Synergy drug-eluting stent           Post dilated up with [place] balloons up to 4.0 X 15 mm at 16 atm        PTCA LCx 2.5 X 15 mm balloon at 8 atm Unusually long and challenging procedure due to use of destination sheath, abrupt vessel closure and dissection requiring use of multiple wires, bifurcation stenting with provisional stenting approach. Contrast used: 240 cc Will need await recovery from this  procedure and reassessment of renal function tomorrow before decision on addressing prox RCA severe lesion. Cody Das, MD   ECHOCARDIOGRAM COMPLETE Result Date: 02/07/2024    ECHOCARDIOGRAM REPORT   Patient Name:   Lee Greer Date of Exam: 02/07/2024 Medical Rec #:  [id number]         Height:       72.0 in Accession #:    [id number]        Weight:       140.0 lb Date of Birth:  03-07-1966         BSA:          1.831 m Patient Age:    58 years          BP:           132/111 mmHg Patient Gender: M                 HR:           101 bpm. Exam Location:  Inpatient Procedure: 2D Echo, Cardiac Doppler, Color Doppler and Intracardiac            Opacification Agent (Both Spectral and Color Flow Doppler were            utilized during procedure). Indications:    NSTEMI  History:        Patient has no prior history of Echocardiogram examinations.  Sonographer:    Janette Medley Referring Phys: [id number] NKIRU C OSUDE IMPRESSIONS  1. Findings consistent with ischemia/infarction in the right coronary artery territory, with compensatory hyperdynamic contractility in other wall segments. Left ventricular ejection fraction, by estimation, is 45 to 50%. The left ventricle has mildly decreased function. The left ventricle demonstrates regional wall motion abnormalities (see scoring diagram/findings for description). Left ventricular diastolic parameters were normal. Elevated left ventricular end-diastolic pressure. There is severe hypokinesis of the left ventricular, basal-mid inferior wall, inferolateral wall and inferoseptal wall.  2. Right ventricular systolic function is mildly reduced. The right ventricular size is normal. Tricuspid regurgitation signal is inadequate for  assessing PA pressure.  3. The mitral valve is normal in structure. Mild to moderate mitral valve regurgitation. No evidence of mitral stenosis.  4. The aortic valve is tricuspid. Aortic valve regurgitation is not visualized. No aortic stenosis is  present.  5. The inferior vena cava is normal in size with greater than 50% respiratory variability, suggesting right atrial pressure of 3 mmHg. FINDINGS  Left Ventricle: Findings consistent with ischemia/infarction in the right coronary artery territory, with compensatory hyperdynamic contractility in other wall segments. Left ventricular ejection fraction, by estimation, is 45 to 50%. The left ventricle  has mildly decreased function. The left ventricle demonstrates regional wall motion abnormalities. Severe hypokinesis of the left ventricular, basal-mid inferior wall, inferolateral wall and inferoseptal wall. The left ventricular internal cavity size was normal in size. There is no left ventricular hypertrophy. Left ventricular diastolic parameters were normal. Elevated left ventricular end-diastolic pressure.  LV Wall Scoring: The inferior wall, posterior wall, mid inferoseptal segment, and basal inferoseptal segment are hypokinetic. Right Ventricle: The right ventricular size is normal. No increase in right ventricular wall thickness. Right ventricular systolic function is mildly reduced. Tricuspid regurgitation signal is inadequate for assessing PA pressure. Left Atrium: Left atrial size was normal in size. Right Atrium: Right atrial size was normal in size. Pericardium: There is no evidence of pericardial effusion. Mitral Valve: The mitral valve is normal in structure. Mild to moderate mitral valve regurgitation, with centrally-directed jet. No evidence of mitral valve stenosis. Tricuspid Valve: The tricuspid valve is normal in structure. Tricuspid valve regurgitation is not demonstrated. No evidence of tricuspid stenosis. Aortic Valve: The aortic valve is tricuspid. Aortic valve regurgitation is not visualized. No aortic stenosis is present. Pulmonic Valve: The pulmonic valve was not well visualized. Pulmonic valve regurgitation is not visualized. No evidence of pulmonic stenosis. Aorta: The aortic root is  normal in size and structure. Venous: The inferior vena cava is normal in size with greater than 50% respiratory variability, suggesting right atrial pressure of 3 mmHg. IAS/Shunts: No atrial level shunt detected by color flow Doppler.  LEFT VENTRICLE PLAX 2D LVIDd:         4.00 cm   Diastology LVIDs:         2.90 cm   LV e' medial:    7.62 cm/s LV PW:         0.90 cm   LV E/e' medial:  8.6 LV IVS:        0.90 cm   LV e' lateral:   9.46 cm/s LVOT diam:     2.20 cm   LV E/e' lateral: 6.9 LV SV:         32 LV SV Index:   17 LVOT Area:     3.80 cm  RIGHT VENTRICLE            IVC RV S prime:     8.38 cm/s  IVC diam: 1.50 cm TAPSE (M-mode): 2.0 cm LEFT ATRIUM             Index       RIGHT ATRIUM          Index LA diam:        2.10 cm 1.15 cm/m  RA Area:     5.37 cm LA Vol (A2C):   10.1 ml 5.52 ml/m  RA Volume:   7.86 ml  4.29 ml/m LA Vol (A4C):   13.2 ml 7.21 ml/m LA Biplane Vol: 11.5 ml 6.28 ml/m  AORTIC VALVE LVOT Vmax:  63.90 cm/s LVOT Vmean:  41.900 cm/s LVOT VTI:    0.083 m  AORTA Ao Root diam: 3.00 cm MITRAL VALVE MV Area (PHT): 4.77 cm    SHUNTS MV Decel Time: 159 msec    Systemic VTI:  0.08 m MV E velocity: 65.50 cm/s  Systemic Diam: 2.20 cm Karyl Paget Croitoru MD Electronically signed by Luana Rumple MD Signature Date/Time: 02/07/2024/12:39:31 PM    Final     Cardiac Studies Echo: IMPRESSIONS     1. Findings consistent with ischemia/infarction in the right coronary  artery territory, with compensatory hyperdynamic contractility in other  wall segments. Left ventricular ejection fraction, by estimation, is 45 to  50%. The left ventricle has mildly  decreased function. The left ventricle demonstrates regional wall motion  abnormalities (see scoring diagram/findings for description). Left  ventricular diastolic parameters were normal. Elevated left ventricular  end-diastolic pressure. There is severe  hypokinesis of the left ventricular, basal-mid inferior wall,  inferolateral wall and  inferoseptal wall.   2. Right ventricular systolic function is mildly reduced. The right  ventricular size is normal. Tricuspid regurgitation signal is inadequate  for assessing PA pressure.   3. The mitral valve is normal in structure. Mild to moderate mitral valve  regurgitation. No evidence of mitral stenosis.   4. The aortic valve is tricuspid. Aortic valve regurgitation is not  visualized. No aortic stenosis is present.   5. The inferior vena cava is normal in size with greater than 50%  respiratory variability, suggesting right atrial pressure of 3 mmHg.   LEFT HEART CATH AND CORONARY ANGIOGRAPHY  CORONARY STENT INTERVENTION  Coronary Ultrasound/IVUS   Conclusion  Coronary angiography & intervention 02/07/2024: LM: Normal LAD: Mid 40%, 30% stenoses Lcx: Prox thrombotic 95% stenosis extending into OM1 and mid LCx RCA: Prox 80% stenosis   LVEDP 2 mmHg   Successful percutaneous coronary intervention Lcx/OM        PTCA and stent placement OM1 2.5 X 48 mm Synergy drug-eluting stent           Post dilated up with [place] balloons up to 4.0 X 15 mm at 16 atm        PTCA LCx 2.5 X 15 mm balloon at 8 atm      Patient Profile   58 y.o. male with HTN, HLD untreated presents with NSTEMI  Assessment & Plan  NSTEMI. Peak troponin 19,818. Ecg with ST depression. EF mildly reduced 45-50% with inferior HK. Cardiac cath with severe 2 vessel disease. Culprit lesion in LCx with thrombus. S/p PCI with DES x 2. Procedure complicated but abrupt vessel closure. Ended up with an excellent result. 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 Plan DAPT with ASA and Effient for one year. Plan  staged PCI of the RCA while here. Given low BP through the night and nausea will likely stay on telemetry tonight. Beta blocker and ARB held for low BP  Accelerated junctional rhythm. Rate 120. Post MI. Now resolved. Hold BB due to low BP  LV dysfunction.  Hopefully will improve with revascularization. Medical therapy limited by low BP - will reassess prior to DC HLD. LDL 147. Now on high dose statin HTN.BP low last night. Antihypertensives on old.  N/V. Resolved. Will add protonix .   For questions or updates, please contact [facility] Please consult [email or web address] for contact info under     Signed, Milan Clare [place], MD  02/09/2024, 7:05 AM

## 2024-02-09 NOTE — Plan of Care (Signed)
   Problem: Education: Goal: Understanding of cardiac disease, CV risk reduction, and recovery process will improve Outcome: Progressing Goal: Individualized Educational Video(s) Outcome: Progressing   Problem: Activity: Goal: Ability to tolerate increased activity will improve Outcome: Progressing   Problem: Cardiac: Goal: Ability to achieve and maintain adequate cardiovascular perfusion will improve Outcome: Progressing   Problem: Health Behavior/Discharge Planning: Goal: Ability to safely manage health-related needs after discharge will improve Outcome: Progressing   Problem: Education: Goal: Understanding of CV disease, CV risk reduction, and recovery process will improve Outcome: Progressing Goal: Individualized Educational Video(s) Outcome: Progressing   Problem: Activity: Goal: Ability to return to baseline activity level will improve Outcome: Progressing   Problem: Cardiovascular: Goal: Ability to achieve and maintain adequate cardiovascular perfusion will improve Outcome: Progressing Goal: Vascular access site(s) Level 0-1 will be maintained Outcome: Progressing   Problem: Health Behavior/Discharge Planning: Goal: Ability to safely manage health-related needs after discharge will improve Outcome: Progressing   Problem: Education: Goal: Knowledge of General Education information will improve Description: Including pain rating scale, medication(s)/side effects and non-pharmacologic comfort measures Outcome: Progressing   Problem: Health Behavior/Discharge Planning: Goal: Ability to manage health-related needs will improve Outcome: Progressing   Problem: Clinical Measurements: Goal: Ability to maintain clinical measurements within normal limits will improve Outcome: Progressing Goal: Will remain free from infection Outcome: Progressing Goal: Diagnostic test results will improve Outcome: Progressing Goal: Respiratory complications will improve Outcome:  Progressing Goal: Cardiovascular complication will be avoided Outcome: Progressing   Problem: Activity: Goal: Risk for activity intolerance will decrease Outcome: Progressing   Problem: Nutrition: Goal: Adequate nutrition will be maintained Outcome: Progressing   Problem: Coping: Goal: Level of anxiety will decrease Outcome: Progressing   Problem: Elimination: Goal: Will not experience complications related to bowel motility Outcome: Progressing Goal: Will not experience complications related to urinary retention Outcome: Progressing   Problem: Pain Managment: Goal: General experience of comfort will improve and/or be controlled Outcome: Progressing   Problem: Safety: Goal: Ability to remain free from injury will improve Outcome: Progressing   Problem: Skin Integrity: Goal: Risk for impaired skin integrity will decrease Outcome: Progressing

## 2024-02-10 ENCOUNTER — Telehealth: Payer: Self-pay | Admitting: Cardiology

## 2024-02-10 ENCOUNTER — Other Ambulatory Visit (HOSPITAL_COMMUNITY): Payer: Self-pay

## 2024-02-10 DIAGNOSIS — E785 Hyperlipidemia, unspecified: Secondary | ICD-10-CM | POA: Insufficient documentation

## 2024-02-10 LAB — CBC
HCT: 36.4 % — ABNORMAL LOW (ref 39.0–52.0)
Hemoglobin: 12 g/dL — ABNORMAL LOW (ref 13.0–17.0)
MCH: 29.5 pg (ref 26.0–34.0)
MCHC: 33 g/dL (ref 30.0–36.0)
MCV: 89.4 fL (ref 80.0–100.0)
Platelets: 222 10*3/uL (ref 150–400)
RBC: 4.07 MIL/uL — ABNORMAL LOW (ref 4.22–5.81)
RDW: 12.7 % (ref 11.5–15.5)
WBC: 12 10*3/uL — ABNORMAL HIGH (ref 4.0–10.5)
nRBC: 0 % (ref 0.0–0.2)

## 2024-02-10 LAB — BASIC METABOLIC PANEL WITH GFR
Anion gap: 12 (ref 5–15)
BUN: 20 mg/dL (ref 6–20)
CO2: 22 mmol/L (ref 22–32)
Calcium: 8.8 mg/dL — ABNORMAL LOW (ref 8.9–10.3)
Chloride: 107 mmol/L (ref 98–111)
Creatinine, Ser: 0.97 mg/dL (ref 0.61–1.24)
GFR, Estimated: >60 mL/min (ref >60)
Glucose, Bld: 172 mg/dL — ABNORMAL HIGH (ref 70–99)
Potassium: 3.1 mmol/L — ABNORMAL LOW (ref 3.5–5.1)
Sodium: 141 mmol/L (ref 135–145)

## 2024-02-10 MED ORDER — ASPIRIN 81 MG PO TBEC
81.0000 mg | DELAYED_RELEASE_TABLET | Freq: Every day | ORAL | 2 refills | Status: AC
  Filled 2024-02-10: qty 90, 90d supply, fill #0

## 2024-02-10 MED ORDER — PANTOPRAZOLE SODIUM 40 MG PO TBEC
40.0000 mg | DELAYED_RELEASE_TABLET | Freq: Every day | ORAL | 0 refills | Status: DC
  Filled 2024-02-10: qty 30, 30d supply, fill #0

## 2024-02-10 MED ORDER — POTASSIUM CHLORIDE CRYS ER 20 MEQ PO TBCR
60.0000 meq | EXTENDED_RELEASE_TABLET | Freq: Once | ORAL | Status: AC
  Administered 2024-02-10: 60 meq via ORAL
  Filled 2024-02-10: qty 3

## 2024-02-10 MED ORDER — NITROGLYCERIN 0.4 MG SL SUBL
0.4000 mg | SUBLINGUAL_TABLET | SUBLINGUAL | 2 refills | Status: DC | PRN
  Filled 2024-02-10: qty 25, 7d supply, fill #0

## 2024-02-10 MED ORDER — PRASUGREL HCL 10 MG PO TABS
10.0000 mg | ORAL_TABLET | Freq: Every day | ORAL | 2 refills | Status: DC
  Filled 2024-02-10: qty 30, 30d supply, fill #0

## 2024-02-10 MED ORDER — ATORVASTATIN CALCIUM 80 MG PO TABS
80.0000 mg | ORAL_TABLET | Freq: Every day | ORAL | 2 refills | Status: DC
  Filled 2024-02-10: qty 30, 30d supply, fill #0

## 2024-02-10 MED FILL — Prasugrel HCl Tab 10 MG (Base Equiv): ORAL | Qty: 1 | Status: AC

## 2024-02-10 NOTE — Telephone Encounter (Signed)
   Transition of Care Follow-up Phone Call Request    Patient Name: Lee Greer Date of Birth: 15-Apr-1966 Date of Encounter: 02/10/2024  Primary Care Provider:  Collins Dean, NP Primary Cardiologist:  Cody Das, MD  Lee Greer has been scheduled for a transition of care follow up appointment with a HeartCare provider:  Katlyn West 6/13  Please reach out to Lee Greer within 48 hours of discharge to confirm appointment and review transition of care protocol questionnaire. Anticipated discharge date: 6/6  Johnie Nailer, NP  02/10/2024, 12:03 PM

## 2024-02-10 NOTE — Progress Notes (Signed)
 Rounding Note   Patient Name: Lee Greer Date of Encounter: 02/10/2024  [facility] Cardiologist: None new   Subjective Feels great today, no dyspnea, chest pain or dizziness.  Scheduled Meds:  aspirin EC  81 mg Oral Daily   atorvastatin  80 mg Oral Daily   Chlorhexidine Gluconate Cloth  6 each Topical Daily   heparin  5,000 Units Subcutaneous Q8H   pantoprazole   40 mg Oral Daily   prasugrel  10 mg Oral Daily   sodium chloride  flush  3 mL Intravenous Q12H   sodium chloride  flush  3 mL Intravenous Q12H   Continuous Infusions:  sodium chloride      promethazine  (PHENERGAN ) injection (IM or IVPB) Stopped (02/09/24 0820)   PRN Meds: sodium chloride , acetaminophen , promethazine  (PHENERGAN ) injection (IM or IVPB), sodium chloride  flush, sodium chloride  flush   Vital Signs  Vitals:   02/10/24 0300 02/10/24 0500 02/10/24 0600 02/10/24 0700  BP: 99/69 (!) 87/62 97/65   Pulse: 79 76 75   Resp: (!) 7 16 14    Temp:    97.7 F (36.5 C)  TempSrc:    Oral  SpO2: 100% 100% 98%   Weight:   60.2 kg   Height:        Intake/Output Summary (Last 24 hours) at 02/10/2024 0820 Last data filed at 02/10/2024 0600 Gross per 24 hour  Intake 1407.37 ml  Output 530 ml  Net 877.37 ml      02/10/2024    6:00 AM 02/09/2024    5:59 AM 02/07/2024    7:48 PM  Last 3 Weights  Weight (lbs) 132 lb 11.5 oz 116 lb 2.9 oz 118 lb 9.7 oz  Weight (kg) 60.2 kg 52.7 kg 53.8 kg      Telemetry NSR/sinus tachycardia  ECG  None today  Physical Exam  GEN: No acute distress.   Neck: No JVD Cardiac: RRR, no murmurs, rubs, or gallops.  Respiratory: Clear to auscultation bilaterally. GI: Soft, nontender, non-distended  MS: No edema; No deformity. Radial site looks good.  Neuro:  Nonfocal  Psych: Normal affect   Labs High Sensitivity Troponin:   Recent Labs  Lab 02/06/24 1538 02/06/24 1748  TROPONINIHS 19,818* 16,810*     Chemistry Recent Labs  Lab 02/06/24 1538 02/06/24 1603  02/07/24 0735 02/08/24 0825 02/09/24 0346 02/09/24 1623  NA 139   < > 143 140 139  --   K 4.0   < > 4.2 3.6 3.5  --   CL 103   < > 103 107 106  --   CO2 20*  --  25 20* 25  --   GLUCOSE 137*   < > 128* 127* 106*  --   BUN 18   < > 24* 22* 25*  --   CREATININE 1.02   < > 0.82 0.88 0.92 0.97  CALCIUM 10.0  --  9.4 8.8* 8.6*  --   PROT 9.5*  --  8.7*  --   --   --   ALBUMIN 5.0  --  4.5  --   --   --   AST 198*  --  103*  --   --   --   ALT 53*  --  42  --   --   --   ALKPHOS 78  --  74  --   --   --   BILITOT 0.7  --  1.0  --   --   --   GFRNONAA >60  --  >  60 >60 >60 >60  ANIONGAP 16*  --  15 13 8   --    < > = values in this interval not displayed.    Lipids  Recent Labs  Lab 02/07/24 0726  CHOL 271*  TRIG 74  HDL 109  LDLCALC 147*  CHOLHDL 2.5    Hematology Recent Labs  Lab 02/09/24 0346 02/09/24 1623 02/10/24 0407  WBC 13.2* 11.4* 12.0*  RBC 3.94* 4.32 4.07*  HGB 12.2* 12.9* 12.0*  HCT 36.0* 38.3* 36.4*  MCV 91.4 88.7 89.4  MCH 31.0 29.9 29.5  MCHC 33.9 33.7 33.0  RDW 13.2 12.8 12.7  PLT 204 228 222   Thyroid  No results for input(s): "TSH", "FREET4" in the last 168 hours.  BNPNo results for input(s): "BNP", "PROBNP" in the last 168 hours.  DDimer No results for input(s): "DDIMER" in the last 168 hours.   Radiology  CARDIAC CATHETERIZATION Result Date: 02/09/2024 Images from the original result were not included. Coronary intervention 02/09/2024: (See separate coronary angiogram and intervention note dated 02/07/2024) RCA: Proximal diffuse 50%, mid 80%, distal focal 70% stenoses Successful percutaneous coronary intervention mid RCA        PTCA and stent placement 3.0 X 20 mm Synergy drug-eluting stent Recommend medical management for residual mild to moderate disease. Manish Corliss Dies, MD    Cardiac Studies Echo: IMPRESSIONS     1. Findings consistent with ischemia/infarction in the right coronary  artery territory, with compensatory hyperdynamic  contractility in other  wall segments. Left ventricular ejection fraction, by estimation, is 45 to  50%. The left ventricle has mildly  decreased function. The left ventricle demonstrates regional wall motion  abnormalities (see scoring diagram/findings for description). Left  ventricular diastolic parameters were normal. Elevated left ventricular  end-diastolic pressure. There is severe  hypokinesis of the left ventricular, basal-mid inferior wall,  inferolateral wall and inferoseptal wall.   2. Right ventricular systolic function is mildly reduced. The right  ventricular size is normal. Tricuspid regurgitation signal is inadequate  for assessing PA pressure.   3. The mitral valve is normal in structure. Mild to moderate mitral valve  regurgitation. No evidence of mitral stenosis.   4. The aortic valve is tricuspid. Aortic valve regurgitation is not  visualized. No aortic stenosis is present.   5. The inferior vena cava is normal in size with greater than 50%  respiratory variability, suggesting right atrial pressure of 3 mmHg.   LEFT HEART CATH AND CORONARY ANGIOGRAPHY  CORONARY STENT INTERVENTION  Coronary Ultrasound/IVUS   Conclusion  Coronary angiography & intervention 02/07/2024: LM: Normal LAD: Mid 40%, 30% stenoses Lcx: Prox thrombotic 95% stenosis extending into OM1 and mid LCx RCA: Prox 80% stenosis   LVEDP 2 mmHg   Successful percutaneous coronary intervention Lcx/OM        PTCA and stent placement OM1 2.5 X 48 mm Synergy drug-eluting stent           Post dilated up with [place] balloons up to 4.0 X 15 mm at 16 atm        PTCA LCx 2.5 X 15 mm balloon at 8 atm      Patient Profile   58 y.o. male with HTN, HLD untreated presents with NSTEMI  Assessment & Plan  NSTEMI. Peak troponin 19,818. Ecg with ST depression. EF mildly reduced 45-50% with inferior HK. Cardiac cath with severe 2 vessel disease. Culprit lesion in LCx with thrombus. S/p PCI with DES x 2. Procedure  complicated but abrupt vessel  closure. Ended up with an excellent result. S/p staged PCI of the mid RCA yesterday. Plan DAPT with ASA and Effient for one year. BP low so not a candidate for Beta blocker or ARB at this time. Lactate yesterday 1.0  Accelerated junctional rhythm. Rate 120. Post MI. Now resolved. Hold BB due to low BP  LV dysfunction. Hopefully will improve with revascularization. Medical therapy limited by low BP. Will need to reassess as outpatient.  HLD. LDL 147. Now on high dose statin  N/V. Resolved. Will add protonix .  Plan DC home today  For questions or updates, please contact [facility] Please consult [email or web address] for contact info under     Signed, Jebadiah Imperato [place], MD  02/10/2024, 8:20 AM

## 2024-02-10 NOTE — Plan of Care (Signed)
  Problem: Clinical Measurements: Goal: Respiratory complications will improve Outcome: Progressing   Problem: Cardiac: Goal: Ability to achieve and maintain adequate cardiovascular perfusion will improve Outcome: Progressing   Problem: Clinical Measurements: Goal: Diagnostic test results will improve Outcome: Progressing   Problem: Education: Goal: Understanding of CV disease, CV risk reduction, and recovery process will improve Outcome: Progressing

## 2024-02-10 NOTE — Progress Notes (Signed)
 Pt has been ambulating without difficulty. Discussed with pt MI, stents, restrictions, Effient importance, diet, exercise, NTG and CRPII. Pt receptive, eager to make changes. He especially voices that he is done smoking marijuana. Will refer to [facility] CRPII however will need insurance coverage. [id number] German Koller BS, ACSM-CEP 02/10/2024 10:30 AM

## 2024-02-10 NOTE — Telephone Encounter (Signed)
 Pt discharged today from [facility].  Triage nursing to place [facility] call to the pt on next Monday 02/13/24.

## 2024-02-10 NOTE — Discharge Summary (Signed)
 Discharge Summary   Patient ID: Lee Greer MRN: [id number]; DOB: 07-10-1966  Admit date: 02/06/2024 Discharge date: 02/10/2024  PCP:  Collins Dean, NP   [facility] Providers Cardiologist:  Cody Das, MD     Discharge Diagnoses  Principal Problem:   Elevated troponin Active Problems:   Hypokalemia   Non-ST elevation (NSTEMI) myocardial infarction [facility])   Hyperlipidemia  Diagnostic Studies/Procedures   Coronary angiography & intervention 02/07/2024: LM: Normal LAD: Mid 40%, 30% stenoses Lcx: Prox thrombotic 95% stenosis extending into OM1 and mid LCx RCA: Prox 80% stenosis   LVEDP 2 mmHg   Successful percutaneous coronary intervention Lcx/OM        PTCA and stent placement OM1 2.5 X 48 mm Synergy drug-eluting stent           Post dilated up with [place] balloons up to 4.0 X 15 mm at 16 atm        PTCA LCx 2.5 X 15 mm balloon at 8 atm        Unusually long and challenging procedure due to use of destination sheath, abrupt vessel closure and dissection requiring use of multiple wires, bifurcation stenting with provisional stenting approach.   Contrast used: 240 cc Will need await recovery from this procedure and reassessment of renal function tomorrow before decision on addressing prox RCA severe lesion.   Cody Das, MD  Coronary intervention 02/09/2024: (See separate coronary angiogram and intervention note dated 02/07/2024) RCA: Proximal diffuse 50%, mid 80%, distal focal 70% stenoses   Successful percutaneous coronary intervention mid RCA        PTCA and stent placement 3.0 X 20 mm Synergy drug-eluting stent      Recommend medical management for residual mild to moderate disease.     Cody Das, MD   Echo: 02/07/2024  IMPRESSIONS     1. Findings consistent with ischemia/infarction in the right coronary  artery territory, with compensatory hyperdynamic contractility in other  wall segments. Left ventricular ejection  fraction, by estimation, is 45 to  50%. The left ventricle has mildly  decreased function. The left ventricle demonstrates regional wall motion  abnormalities (see scoring diagram/findings for description). Left  ventricular diastolic parameters were normal. Elevated left ventricular  end-diastolic pressure. There is severe  hypokinesis of the left ventricular, basal-mid inferior wall,  inferolateral wall and inferoseptal wall.   2. Right ventricular systolic function is mildly reduced. The right  ventricular size is normal. Tricuspid regurgitation signal is inadequate  for assessing PA pressure.   3. The mitral valve is normal in structure. Mild to moderate mitral valve  regurgitation. No evidence of mitral stenosis.   4. The aortic valve is tricuspid. Aortic valve regurgitation is not  visualized. No aortic stenosis is present.   5. The inferior vena cava is normal in size with greater than 50%  respiratory variability, suggesting right atrial pressure of 3 mmHg.   FINDINGS   Left Ventricle: Findings consistent with ischemia/infarction in the right  coronary artery territory, with compensatory hyperdynamic contractility in  other wall segments. Left ventricular ejection fraction, by estimation, is  45 to 50%. The left ventricle   has mildly decreased function. The left ventricle demonstrates regional  wall motion abnormalities. Severe hypokinesis of the left ventricular,  basal-mid inferior wall, inferolateral wall and inferoseptal wall. The  left ventricular internal cavity size  was normal in size. There is no left ventricular hypertrophy. Left  ventricular diastolic parameters were normal. Elevated left ventricular  end-diastolic pressure.     LV Wall Scoring:  The inferior wall, posterior wall, mid inferoseptal segment, and basal  inferoseptal segment are hypokinetic.   Right Ventricle: The right ventricular size is normal. No increase in  right ventricular wall thickness.  Right ventricular systolic function is  mildly reduced. Tricuspid regurgitation signal is inadequate for assessing  PA pressure.   Left Atrium: Left atrial size was normal in size.   Right Atrium: Right atrial size was normal in size.   Pericardium: There is no evidence of pericardial effusion.   Mitral Valve: The mitral valve is normal in structure. Mild to moderate  mitral valve regurgitation, with centrally-directed jet. No evidence of  mitral valve stenosis.   Tricuspid Valve: The tricuspid valve is normal in structure. Tricuspid  valve regurgitation is not demonstrated. No evidence of tricuspid  stenosis.   Aortic Valve: The aortic valve is tricuspid. Aortic valve regurgitation is  not visualized. No aortic stenosis is present.   Pulmonic Valve: The pulmonic valve was not well visualized. Pulmonic valve  regurgitation is not visualized. No evidence of pulmonic stenosis.   Aorta: The aortic root is normal in size and structure.   Venous: The inferior vena cava is normal in size with greater than 50%  respiratory variability, suggesting right atrial pressure of 3 mmHg.   IAS/Shunts: No atrial level shunt detected by color flow Doppler.   _____________   History of Present Illness   Lee Greer is a 58 y.o. male with cannabinoid hyperemesis syndrome, esophageal tear, gastric ulcers, gastroparesis who is being seen 02/07/2024 for the evaluation of NSTEMI. Per chart review, patient has longstanding history of intermittent nausea and vomiting.  Does not appear to have any past cardiac history and does not follow with cardiology. Presented to the ED on 6/2 complaining of nausea and vomiting that have been ongoing for 3 days.  Also reported having 2 episodes of substernal chest discomfort, each lasting about 15 minutes.   In the ED, initial vital signs showed BP 132/97, oxygen 100% on room air, heart rate 117 bpm.  High-sensitivity troponin elevated 19,818> 16,810.  Drug screen  positive for THC.  Urinalysis with many bacteria, moderate hemoglobin.  Lactic acid 3.1> 2.6.  Chest x-ray showed chronic hyperinflation, no acute findings. Initial EKG in the ED showed sinus tachycardia with heart rate 114 bpm, biatrial enlargement, LVH, ST depression in V3-V5   In the ED, patient was given Phenergan  for nausea/vomiting.  Also started on IV heparin with elevated troponin.  Started on aspirin 81 mg daily, Lipitor 80 mg daily.   Patient reported that early on Friday morning around 1 AM, he was resting and watching television when he had sudden onset chest pain.  Has a difficult time describing pain, reported he feels like he "had a heart attack".  Also had diaphoresis.  Denied shortness of breath, nausea, syncope, near syncope.  After about 15 minutes, the pain went away on its own.  On Friday during the day, he again had an episode of similar chest pain.  Pain again lasted about 15 minutes then resolved on its own.  He denies ever having chest pain before.  Denied personal cardiac history.  He admits to smoking marijuana but does not have any tobacco use history.  His mother had a heart attack when she was 58 years old.   On Saturday, patient had a large meal and started to vomit.  He was unable to keep water or ice chips down  so he decided come to the ED for dehydration and nausea medications.  He had been given IV fluids and nausea medications, no longer has any complaints of nausea or vomiting.  He denied fever, chills, body aches.  Denies urinary symptoms.  Denies vomiting blood or melena.    Hospital Course    NSTEMI -- hsTn peaked at [phone], underwent cardiac cath successful PCI/DES x2 mid circumflex extending into OM1.  Procedure was complicated by abrupt vessel closure, but good result at the end of the case.  Staged intervention PCI/DES to mid RCA.  Recommendations for DAPT with aspirin/Effient for at least 1 year.  Accelerated junctional rhythm -- Noted with heart rate in the  120s post MI, resolved -- No plans for beta-blocker at this time secondary to soft BP  ICM -- Echocardiogram with LVEF of 45 to 50%, severe hypokinesis of the LV, basal inferior and inferior lateral wall, mildly reduced RV -- GDMT limited by low BP, consider additional therapy as an outpatient  Hyperlipidemia -- LDL 147 -- Continue atorvastatin 80 mg daily  Hypokalemia -- K+ 3.1, supplemented prior to discharge  Did the patient have an acute coronary syndrome (MI, NSTEMI, STEMI, etc) this admission?:  Yes                               AHA/ACC ACS Clinical Performance & Quality Measures: Aspirin prescribed? - Yes ADP Receptor Inhibitor (Plavix/Clopidogrel, Brilinta/Ticagrelor or Effient/Prasugrel) prescribed (includes medically managed patients)? - Yes Beta Blocker prescribed? - No - soft BP High Intensity Statin (Lipitor 40-80mg  or Crestor 20-40mg ) prescribed? - Yes EF assessed during THIS hospitalization? - Yes For EF <40%, was ACEI/ARB prescribed? - Not Applicable (EF >/= 40%) For EF <40%, Aldosterone Antagonist (Spironolactone or Eplerenone) prescribed? - Not Applicable (EF >/= 40%) Cardiac Rehab Phase II ordered (including medically managed patients)? - Yes     The patient will be scheduled for a TOC follow up appointment in 10-14 days.  A message has been sent to the [facility] and Scheduling Pool at the office where the patient should be seen for follow up.  _____________  Discharge Vitals Blood pressure 102/76, pulse 87, temperature 97.7 F (36.5 C), temperature source Oral, resp. rate 16, height 6' (1.829 m), weight 60.2 kg, SpO2 98%.  Filed Weights   02/07/24 1948 02/09/24 0559 02/10/24 0600  Weight: 53.8 kg 52.7 kg 60.2 kg    Labs & Radiologic Studies  CBC Recent Labs    02/09/24 1623 02/10/24 0407  WBC 11.4* 12.0*  HGB 12.9* 12.0*  HCT 38.3* 36.4*  MCV 88.7 89.4  PLT 228 222   Basic Metabolic Panel Recent Labs    60/45/40 0346 02/09/24 1623  02/10/24 1028  NA 139  --  141  K 3.5  --  3.1*  CL 106  --  107  CO2 25  --  22  GLUCOSE 106*  --  172*  BUN 25*  --  20  CREATININE 0.92 0.97 0.97  CALCIUM 8.6*  --  8.8*   Liver Function Tests No results for input(s): "AST", "ALT", "ALKPHOS", "BILITOT", "PROT", "ALBUMIN" in the last 72 hours. No results for input(s): "LIPASE", "AMYLASE" in the last 72 hours. High Sensitivity Troponin:   Recent Labs  Lab 02/06/24 1538 02/06/24 1748  TROPONINIHS 19,818* 16,810*    No results for input(s): "TRNPT" in the last 720 hours.  BNP Invalid input(s): "POCBNP" No results for input(s): "PROBNP"  in the last 72 hours.  No results for input(s): "BNP" in the last 72 hours.  D-Dimer No results for input(s): "DDIMER" in the last 72 hours. Hemoglobin A1C No results for input(s): "HGBA1C" in the last 72 hours. Fasting Lipid Panel No results for input(s): "CHOL", "HDL", "LDLCALC", "TRIG", "CHOLHDL", "LDLDIRECT" in the last 72 hours. Lipoprotein (a)  Date/Time Value Ref Range Status  02/08/2024 02:19 AM 233.2 (H) <75.0 nmol/L Final    Comment:    (NOTE) Note:  Values greater than or equal to 75.0 nmol/L may       indicate an independent risk factor for CHD,       but must be evaluated with caution when applied       to non-Caucasian populations due to the       influence of genetic factors on Lp(a) across       ethnicities. Performed At: [facility] [address] [place], [place] [id number] Pearlean Botts MD QM:[id number]     Thyroid  Function Tests No results for input(s): "TSH", "T4TOTAL", "T3FREE", "THYROIDAB" in the last 72 hours.  Invalid input(s): "FREET3" _____________  CARDIAC CATHETERIZATION Result Date: 02/09/2024 Images from the original result were not included. Coronary intervention 02/09/2024: (See separate coronary angiogram and intervention note dated 02/07/2024) RCA: Proximal diffuse 50%, mid 80%, distal focal 70% stenoses Successful percutaneous coronary  intervention mid RCA        PTCA and stent placement 3.0 X 20 mm Synergy drug-eluting stent Recommend medical management for residual mild to moderate disease. Cody Das, MD   CARDIAC CATHETERIZATION Result Date: 02/08/2024 Images from the original result were not included. Coronary angiography & intervention 02/07/2024: LM: Normal LAD: Mid 40%, 30% stenoses Lcx: Prox thrombotic 95% stenosis extending into OM1 and mid LCx RCA: Prox 80% stenosis LVEDP 2 mmHg Successful percutaneous coronary intervention Lcx/OM        PTCA and stent placement OM1 2.5 X 48 mm Synergy drug-eluting stent           Post dilated up with [place] balloons up to 4.0 X 15 mm at 16 atm        PTCA LCx 2.5 X 15 mm balloon at 8 atm Unusually long and challenging procedure due to use of destination sheath, abrupt vessel closure and dissection requiring use of multiple wires, bifurcation stenting with provisional stenting approach. Contrast used: 240 cc Will need await recovery from this procedure and reassessment of renal function tomorrow before decision on addressing prox RCA severe lesion. Cody Das, MD   ECHOCARDIOGRAM COMPLETE Result Date: 02/07/2024    ECHOCARDIOGRAM REPORT   Patient Name:   Lee Greer Date of Exam: 02/07/2024 Medical Rec #:  [id number]         Height:       72.0 in Accession #:    [id number]        Weight:       140.0 lb Date of Birth:  May 02, 1966         BSA:          1.831 m Patient Age:    58 years          BP:           132/111 mmHg Patient Gender: M                 HR:           101 bpm. Exam Location:  Inpatient Procedure: 2D Echo, Cardiac Doppler, Color Doppler  and Intracardiac            Opacification Agent (Both Spectral and Color Flow Doppler were            utilized during procedure). Indications:    NSTEMI  History:        Patient has no prior history of Echocardiogram examinations.  Sonographer:    Janette Medley Referring Phys: [id number] NKIRU C OSUDE IMPRESSIONS  1. Findings consistent with  ischemia/infarction in the right coronary artery territory, with compensatory hyperdynamic contractility in other wall segments. Left ventricular ejection fraction, by estimation, is 45 to 50%. The left ventricle has mildly decreased function. The left ventricle demonstrates regional wall motion abnormalities (see scoring diagram/findings for description). Left ventricular diastolic parameters were normal. Elevated left ventricular end-diastolic pressure. There is severe hypokinesis of the left ventricular, basal-mid inferior wall, inferolateral wall and inferoseptal wall.  2. Right ventricular systolic function is mildly reduced. The right ventricular size is normal. Tricuspid regurgitation signal is inadequate for assessing PA pressure.  3. The mitral valve is normal in structure. Mild to moderate mitral valve regurgitation. No evidence of mitral stenosis.  4. The aortic valve is tricuspid. Aortic valve regurgitation is not visualized. No aortic stenosis is present.  5. The inferior vena cava is normal in size with greater than 50% respiratory variability, suggesting right atrial pressure of 3 mmHg. FINDINGS  Left Ventricle: Findings consistent with ischemia/infarction in the right coronary artery territory, with compensatory hyperdynamic contractility in other wall segments. Left ventricular ejection fraction, by estimation, is 45 to 50%. The left ventricle  has mildly decreased function. The left ventricle demonstrates regional wall motion abnormalities. Severe hypokinesis of the left ventricular, basal-mid inferior wall, inferolateral wall and inferoseptal wall. The left ventricular internal cavity size was normal in size. There is no left ventricular hypertrophy. Left ventricular diastolic parameters were normal. Elevated left ventricular end-diastolic pressure.  LV Wall Scoring: The inferior wall, posterior wall, mid inferoseptal segment, and basal inferoseptal segment are hypokinetic. Right Ventricle: The  right ventricular size is normal. No increase in right ventricular wall thickness. Right ventricular systolic function is mildly reduced. Tricuspid regurgitation signal is inadequate for assessing PA pressure. Left Atrium: Left atrial size was normal in size. Right Atrium: Right atrial size was normal in size. Pericardium: There is no evidence of pericardial effusion. Mitral Valve: The mitral valve is normal in structure. Mild to moderate mitral valve regurgitation, with centrally-directed jet. No evidence of mitral valve stenosis. Tricuspid Valve: The tricuspid valve is normal in structure. Tricuspid valve regurgitation is not demonstrated. No evidence of tricuspid stenosis. Aortic Valve: The aortic valve is tricuspid. Aortic valve regurgitation is not visualized. No aortic stenosis is present. Pulmonic Valve: The pulmonic valve was not well visualized. Pulmonic valve regurgitation is not visualized. No evidence of pulmonic stenosis. Aorta: The aortic root is normal in size and structure. Venous: The inferior vena cava is normal in size with greater than 50% respiratory variability, suggesting right atrial pressure of 3 mmHg. IAS/Shunts: No atrial level shunt detected by color flow Doppler.  LEFT VENTRICLE PLAX 2D LVIDd:         4.00 cm   Diastology LVIDs:         2.90 cm   LV e' medial:    7.62 cm/s LV PW:         0.90 cm   LV E/e' medial:  8.6 LV IVS:        0.90 cm   LV e' lateral:  9.46 cm/s LVOT diam:     2.20 cm   LV E/e' lateral: 6.9 LV SV:         32 LV SV Index:   17 LVOT Area:     3.80 cm  RIGHT VENTRICLE            IVC RV S prime:     8.38 cm/s  IVC diam: 1.50 cm TAPSE (M-mode): 2.0 cm LEFT ATRIUM             Index       RIGHT ATRIUM          Index LA diam:        2.10 cm 1.15 cm/m  RA Area:     5.37 cm LA Vol (A2C):   10.1 ml 5.52 ml/m  RA Volume:   7.86 ml  4.29 ml/m LA Vol (A4C):   13.2 ml 7.21 ml/m LA Biplane Vol: 11.5 ml 6.28 ml/m  AORTIC VALVE LVOT Vmax:   63.90 cm/s LVOT Vmean:  41.900  cm/s LVOT VTI:    0.083 m  AORTA Ao Root diam: 3.00 cm MITRAL VALVE MV Area (PHT): 4.77 cm    SHUNTS MV Decel Time: 159 msec    Systemic VTI:  0.08 m MV E velocity: 65.50 cm/s  Systemic Diam: 2.20 cm Karyl Paget Croitoru MD Electronically signed by Luana Rumple MD Signature Date/Time: 02/07/2024/12:39:31 PM    Final    DG Chest 1 View Result Date: 02/06/2024 CLINICAL DATA:  Nausea and vomiting. EXAM: CHEST  1 VIEW COMPARISON:  05/28/2023 FINDINGS: Chronic hyperinflated. The heart is normal in size. Mediastinal contours are stable. No evidence of pneumomediastinum. No focal airspace disease, pleural effusion or pneumothorax. No osseous abnormalities are seen. IMPRESSION: Chronic hyperinflation.  No acute findings. Electronically Signed   By: Chadwick Colonel M.D.   On: 02/06/2024 16:58    Disposition Pt is being discharged home today in good condition.  Follow-up Plans & Appointments  Discharge Instructions     Amb Referral to Cardiac Rehabilitation   Complete by: As directed    Diagnosis:  Coronary Stents NSTEMI PTCA     After initial evaluation and assessments completed: Virtual Based Care may be provided alone or in conjunction with Phase 2 Cardiac Rehab based on patient barriers.: Yes   Intensive Cardiac Rehabilitation (ICR) [facility] location only OR Traditional Cardiac Rehabilitation (TCR) *If criteria for ICR are not met will enroll in TCR [facility] only): Yes   Call MD for:  difficulty breathing, headache or visual disturbances   Complete by: As directed    Call MD for:  persistant dizziness or light-headedness   Complete by: As directed    Call MD for:  redness, tenderness, or signs of infection (pain, swelling, redness, odor or green/yellow discharge around incision site)   Complete by: As directed    Diet - low sodium heart healthy   Complete by: As directed    Discharge instructions   Complete by: As directed    Radial Site Care Refer to this sheet in the next few weeks. These instructions  provide you with information on caring for yourself after your procedure. Your caregiver may also give you more specific instructions. Your treatment has been planned according to current medical practices, but problems sometimes occur. Call your caregiver if you have any problems or questions after your procedure. HOME CARE INSTRUCTIONS You may shower the day after the procedure. Remove the bandage (dressing) and gently wash the site with plain soap  and water. Gently pat the site dry.  Do not apply powder or lotion to the site.  Do not submerge the affected site in water for 3 to 5 days.  Inspect the site at least twice daily.  Do not flex or bend the affected arm for 24 hours.  No lifting over 5 pounds (2.3 kg) for 5 days after your procedure.  Do not drive home if you are discharged the same day of the procedure. Have someone else drive you.  You may drive 24 hours after the procedure unless otherwise instructed by your caregiver.  What to expect: Any bruising will usually fade within 1 to 2 weeks.  Blood that collects in the tissue (hematoma) may be painful to the touch. It should usually decrease in size and tenderness within 1 to 2 weeks.  SEEK IMMEDIATE MEDICAL CARE IF: You have unusual pain at the radial site.  You have redness, warmth, swelling, or pain at the radial site.  You have drainage (other than a small amount of blood on the dressing).  You have chills.  You have a fever or persistent symptoms for more than 72 hours.  You have a fever and your symptoms suddenly get worse.  Your arm becomes pale, cool, tingly, or numb.  You have heavy bleeding from the site. Hold pressure on the site.   PLEASE DO NOT MISS ANY DOSES OF YOUR EFFIENT!!!!! Also keep a log of you blood pressures and bring back to your follow up appt. Please call the office with any questions.   Patients taking blood thinners should generally stay away from medicines like ibuprofen, Advil, Motrin, naproxen, and  Aleve due to risk of stomach bleeding. You may take Tylenol  as directed or talk to your primary doctor about alternatives.   PLEASE ENSURE THAT YOU DO NOT RUN OUT OF YOUR EFFIENT. This medication is very important to remain on for at least one year. IF you have issues obtaining this medication due to cost please CALL the office 3-5 business days prior to running out in order to prevent missing doses of this medication.   Increase activity slowly   Complete by: As directed        Discharge Medications Allergies as of 02/10/2024   No Known Allergies      Medication List     STOP taking these medications    metoCLOPramide  10 MG tablet Commonly known as: REGLAN    ondansetron  4 MG tablet Commonly known as: ZOFRAN        TAKE these medications    aspirin EC 81 MG tablet Take 1 tablet (81 mg total) by mouth daily. Swallow whole. Start taking on: February 11, 2024   atorvastatin 80 MG tablet Commonly known as: LIPITOR Take 1 tablet (80 mg total) by mouth daily. Start taking on: February 11, 2024   multivitamin tablet Take 1 tablet by mouth daily with breakfast.   nitroGLYCERIN 0.4 MG SL tablet Commonly known as: Nitrostat Place 1 tablet (0.4 mg total) under the tongue every 5 (five) minutes as needed.   pantoprazole  40 MG tablet Commonly known as: PROTONIX  Take 1 tablet (40 mg total) by mouth daily. Start taking on: February 11, 2024   prasugrel 10 MG Tabs tablet Commonly known as: EFFIENT Take 1 tablet (10 mg total) by mouth daily. Start taking on: February 11, 2024        Outstanding Labs/Studies  FLP/LFTs in 4 weeks   Duration of Discharge Encounter: APP Time: 20 minutes  Signed, Johnie Nailer, NP 02/10/2024, 11:55 AM

## 2024-02-13 NOTE — Telephone Encounter (Signed)
 Transition Care Management Follow-up Telephone Call Date of discharge and from where: D/C from [facility] 02/10/24 How have you been since you were released from the hospital? Feels good Any questions or concerns? Yes  Items Reviewed: Did the pt receive and understand the discharge instructions provided? Yes  Medications obtained and verified? Yes  Other? No  Any new allergies since your discharge? Yes  Dietary orders reviewed? Yes Do you have support at home? Yes   Home Care and Equipment/Supplies: Were home health services ordered? yes If so, what is the name of the agency? N/A  Has the agency set up a time to come to the patient's home? not applicable Were any new equipment or medical supplies ordered?  No What is the name of the medical supply agency? N/A Were you able to get the supplies/equipment? not applicable Do you have any questions related to the use of the equipment or supplies? No  Functional Questionnaire: (I = Independent and D = Dependent) ADLs: I  Bathing/Dressing- I  Meal Prep- I  Eating- I  Maintaining continence- I  Transferring/Ambulation- I  Managing Meds- I  Follow up appointments reviewed:  PCP Hospital f/u appt confirmed? Yes  Scheduled to see Katlyn West on 02/17/24 Specialist Hospital f/u appt confirmed? N/A Are transportation arrangements needed? No  If their condition worsens, is the pt aware to call PCP or go to the Emergency Dept.? Yes Was the patient provided with contact information for the PCP's office or ED? Yes Was to pt encouraged to call back with questions or concerns? Yes

## 2024-02-15 ENCOUNTER — Telehealth (HOSPITAL_COMMUNITY): Payer: Self-pay

## 2024-02-15 NOTE — Telephone Encounter (Signed)
 Pt unable to do the program do to not having insurance.   Closed referral.

## 2024-02-16 NOTE — Progress Notes (Unsigned)
 Cardiology Office Note    Date:  02/16/2024  ID:  Roxine Cordia, DOB [birth year], MRN [id number] PCP:  Collins Dean, NP  Cardiologist:  Cody Das, MD  Electrophysiologist:  None   Chief Complaint: ***  History of Present Illness: .    Said Rueb Matera is a 58 y.o. male with visit-pertinent history of cannabinoid hyperemesis syndrome, esophageal tear, gastric ulcers, gastroparesis, CAD.  Patient presented on 6/2 complaining of nausea and vomiting that been ongoing for 3 days, also reported having 2 episodes of substernal chest discomfort each lasting about 15 minutes.  In the ED initial troponin [phone].  Drug screen positive for THC.  Urinalysis with no bacteria, moderate hemoglobin.  Lactic acid 3.1>2.6.  Chest x-ray showed chronic hyperinflation, no acute findings.  Initial EKG in the ED showed sinus tachycardia with heart rate 114 bpm, biatrial enlargement, LVH, ST depression in V3 through V5.  In the ED patient was given Phenergan  for nausea/vomiting.  He was also started on IV heparin  with elevated troponin.  Patient reported that early on Friday morning he was resting watching television when he had sudden onset chest pain with associated diaphoresis.  He denied shortness of breath, nausea, syncope, near syncope.  After 15 minutes the pain went without sound.  Later in the day he had another episode of similar chest pain that lasted about 15 minutes and resolved on its own.  On Saturday patient had a large meal and started vomiting, he was unable to keep water or ice chips down so decided come to the ED.  Dehydration and nausea medications.  Patient's highest of the troponin peaked at 19,818, underwent cardiac catheterization with successful PCI/DES x 2 to the mid circumflex extending into OM1, procedure was complicated by abrupt vessel closure, however good result at the end of the case.  He then had a staged intervention PCI/DES to the mid RCA with  recommendations for DAPT with aspirin /Effient  for at least 1 year.  Post MI noted to have accelerated junctional rhythm with heart rate in the 120s, resolved.  Echocardiogram indicated LVEF of 45 to 50%, severe hypokinesis of the LV, basal inferior and inferior lateral wall with mildly reduced RV.  GDMT in hospital was limited by low blood pressure.  CAD: Patient admitted on 02/06/2024 with chest pain, high sensitive troponin peaked at 19,818.  Underwent cardiac cath with successful PCI/DES x 2 in the mid circumflex extending into OM1, procedure complicated by abrupt vessel closure however had good result in the case.  He underwent staged intervention of PCI/DES to mid RCA.  To continue DAPT with aspirin  and Effient  for at least 1 year. Today he reports that he Right radial cath site Cardiac rehab ICM: Echocardiogram during admission indicated LVEF of 45 to 30%, severe hypokinesis of the LV, basal inferior and inferolateral wall with mildly reduced RV.  GDMT while in hospital was limited by low blood pressures. Today he appears GDMT Hyperlipidemia: LDL 147 during admission.  Started on atorvastatin  80 mg daily Check fasting lipid provide LFTs in 6 to 8 weeks.  Labwork independently reviewed:   ROS: .   *** denies chest pain, shortness of breath, lower extremity edema, fatigue, palpitations, melena, hematuria, hemoptysis, diaphoresis, weakness, presyncope, syncope, orthopnea, and PND.  All other systems are reviewed and otherwise negative.  Studies Reviewed: Aaron Aas    EKG:  EKG is ordered today, personally reviewed, demonstrating ***     CV Studies: Cardiac studies reviewed are outlined and summarized above.  Otherwise please see EMR for full report. Cardiac Studies & Procedures   ______________________________________________________________________________________________ CARDIAC CATHETERIZATION  CARDIAC CATHETERIZATION 02/09/2024  Conclusion Images from the original result were not  included. Coronary intervention 02/09/2024: (See separate coronary angiogram and intervention note dated 02/07/2024) RCA: Proximal diffuse 50%, mid 80%, distal focal 70% stenoses  Successful percutaneous coronary intervention mid RCA PTCA and stent placement 3.0 X 20 mm Synergy drug-eluting stent    Recommend medical management for residual mild to moderate disease.   Cody Das, MD  Findings Coronary Findings Diagnostic  Dominance: Right  Left Anterior Descending Mid LAD-1 lesion is 40% stenosed. Mid LAD-2 lesion is 30% stenosed.  Left Circumflex Non-stenotic Prox Cx to Mid Cx lesion was previously treated. The lesion is thrombotic.  First Obtuse Marginal Branch Non-stenotic 1st Mrg lesion was previously treated. The lesion is thrombotic.  Right Coronary Artery Prox RCA lesion is 50% stenosed. Mid RCA lesion is 80% stenosed. Dist RCA lesion is 70% stenosed.  Intervention  No interventions have been documented.   CARDIAC CATHETERIZATION 02/07/2024  Conclusion Images from the original result were not included. Coronary angiography & intervention 02/07/2024: LM: Normal LAD: Mid 40%, 30% stenoses Lcx: Prox thrombotic 95% stenosis extending into OM1 and mid LCx RCA: Prox 80% stenosis  LVEDP 2 mmHg  Successful percutaneous coronary intervention Lcx/OM PTCA and stent placement OM1 2.5 X 48 mm Synergy drug-eluting stent Post dilated up with [place] balloons up to 4.0 X 15 mm at 16 atm PTCA LCx 2.5 X 15 mm balloon at 8 atm     Unusually long and challenging procedure due to use of destination sheath, abrupt vessel closure and dissection requiring use of multiple wires, bifurcation stenting with provisional stenting approach.  Contrast used: 240 cc Will need await recovery from this procedure and reassessment of renal function tomorrow before decision on addressing prox RCA severe lesion.  Cody Das, MD  Findings Coronary Findings Diagnostic   Dominance: Right  Left Anterior Descending Mid LAD-1 lesion is 40% stenosed. Mid LAD-2 lesion is 30% stenosed.  Left Circumflex Prox Cx to Mid Cx lesion is 95% stenosed. The lesion is thrombotic.  First Obtuse Marginal Branch 1st Mrg lesion is 95% stenosed. The lesion is thrombotic.  Right Coronary Artery Prox RCA to Mid RCA lesion is 80% stenosed.  Intervention  Prox Cx to Mid Cx lesion Provisional (Also treats lesions: 1st Mrg) Provisional stenting was successfully performed in the main branch from the Ost Cx to the Prox Cx and in the side branch at the 1st Mrg. Angioplasty independent of stent deployment was performed using a BALLOON EMERGE MR 2.5X15 non-compliant balloon in the side branch. Maximum pressure: 8 atm. Inflation time: 15 sec. A STENT SYNERGY XD [id number] drug-eluting stent was successfully placed. Maximum pressure: 14 atm. Inflation time: 30 sec. Post-stent angioplasty was performed in the main branch and side branch. A BALLOON SAPPHIRE NC24 4.0X15 non-compliant balloon was used in the main branch. Maximum pressure: 16 atm. Inflation time: 20 sec. A BALLOON EMERGE MR 2.5X15 semi-compliant balloon was used in the side branch. Maximum pressure: 8 atm. Inflation time: 15 sec. Angioplasty Balloon angioplasty was performed using a BALLOON EMERGE MR 2.5X15. Maximum pressure: 8 atm. Inflation time: 15 sec. Post-Intervention Lesion Assessment The intervention was successful. Pre-interventional TIMI flow is 0. Post-intervention TIMI flow is 3. No complications occurred at this lesion. There is a 0% residual stenosis post intervention.  1st Mrg lesion Provisional (Also treats lesions: Prox Cx to Mid Cx) Provisional stenting  was successfully performed in the main branch from the Ost Cx to the Prox Cx and in the side branch at the 1st Mrg. Angioplasty independent of stent deployment was performed using a BALLOON EMERGE MR 2.5X15 non-compliant balloon in the side branch. Maximum  pressure: 8 atm. Inflation time: 15 sec. A STENT SYNERGY XD [id number] drug-eluting stent was successfully placed. Maximum pressure: 14 atm. Inflation time: 30 sec. Post-stent angioplasty was performed in the main branch and side branch. A BALLOON SAPPHIRE NC24 4.0X15 non-compliant balloon was used in the main branch. Maximum pressure: 16 atm. Inflation time: 20 sec. A BALLOON EMERGE MR 2.5X15 semi-compliant balloon was used in the side branch. Maximum pressure: 8 atm. Inflation time: 15 sec. Post-Intervention Lesion Assessment The intervention was successful. Pre-interventional TIMI flow is 0. Post-intervention TIMI flow is 3. There is a 0% residual stenosis post intervention.     ECHOCARDIOGRAM  ECHOCARDIOGRAM COMPLETE 02/07/2024  Narrative ECHOCARDIOGRAM REPORT    Patient Name:   DRAVYN SEVERS Date of Exam: 02/07/2024 Medical Rec #:  [id number]         Height:       72.0 in Accession #:    [id number]        Weight:       140.0 lb Date of Birth:  [birth year]         BSA:          1.831 m Patient Age:    58 years          BP:           132/111 mmHg Patient Gender: M                 HR:           101 bpm. Exam Location:  Inpatient  Procedure: 2D Echo, Cardiac Doppler, Color Doppler and Intracardiac Opacification Agent (Both Spectral and Color Flow Doppler were utilized during procedure).  Indications:    NSTEMI  History:        Patient has no prior history of Echocardiogram examinations.  Sonographer:    Janette Medley Referring Phys: [id number] NKIRU C OSUDE  IMPRESSIONS   1. Findings consistent with ischemia/infarction in the right coronary artery territory, with compensatory hyperdynamic contractility in other wall segments. Left ventricular ejection fraction, by estimation, is 45 to 50%. The left ventricle has mildly decreased function. The left ventricle demonstrates regional wall motion abnormalities (see scoring diagram/findings for description). Left ventricular diastolic  parameters were normal. Elevated left ventricular end-diastolic pressure. There is severe hypokinesis of the left ventricular, basal-mid inferior wall, inferolateral wall and inferoseptal wall. 2. Right ventricular systolic function is mildly reduced. The right ventricular size is normal. Tricuspid regurgitation signal is inadequate for assessing PA pressure. 3. The mitral valve is normal in structure. Mild to moderate mitral valve regurgitation. No evidence of mitral stenosis. 4. The aortic valve is tricuspid. Aortic valve regurgitation is not visualized. No aortic stenosis is present. 5. The inferior vena cava is normal in size with greater than 50% respiratory variability, suggesting right atrial pressure of 3 mmHg.  FINDINGS Left Ventricle: Findings consistent with ischemia/infarction in the right coronary artery territory, with compensatory hyperdynamic contractility in other wall segments. Left ventricular ejection fraction, by estimation, is 45 to 50%. The left ventricle has mildly decreased function. The left ventricle demonstrates regional wall motion abnormalities. Severe hypokinesis of the left ventricular, basal-mid inferior wall, inferolateral wall and inferoseptal wall. The left ventricular internal cavity size was normal in size.  There is no left ventricular hypertrophy. Left ventricular diastolic parameters were normal. Elevated left ventricular end-diastolic pressure.   LV Wall Scoring: The inferior wall, posterior wall, mid inferoseptal segment, and basal inferoseptal segment are hypokinetic.  Right Ventricle: The right ventricular size is normal. No increase in right ventricular wall thickness. Right ventricular systolic function is mildly reduced. Tricuspid regurgitation signal is inadequate for assessing PA pressure.  Left Atrium: Left atrial size was normal in size.  Right Atrium: Right atrial size was normal in size.  Pericardium: There is no evidence of pericardial  effusion.  Mitral Valve: The mitral valve is normal in structure. Mild to moderate mitral valve regurgitation, with centrally-directed jet. No evidence of mitral valve stenosis.  Tricuspid Valve: The tricuspid valve is normal in structure. Tricuspid valve regurgitation is not demonstrated. No evidence of tricuspid stenosis.  Aortic Valve: The aortic valve is tricuspid. Aortic valve regurgitation is not visualized. No aortic stenosis is present.  Pulmonic Valve: The pulmonic valve was not well visualized. Pulmonic valve regurgitation is not visualized. No evidence of pulmonic stenosis.  Aorta: The aortic root is normal in size and structure.  Venous: The inferior vena cava is normal in size with greater than 50% respiratory variability, suggesting right atrial pressure of 3 mmHg.  IAS/Shunts: No atrial level shunt detected by color flow Doppler.   LEFT VENTRICLE PLAX 2D LVIDd:         4.00 cm   Diastology LVIDs:         2.90 cm   LV e' medial:    7.62 cm/s LV PW:         0.90 cm   LV E/e' medial:  8.6 LV IVS:        0.90 cm   LV e' lateral:   9.46 cm/s LVOT diam:     2.20 cm   LV E/e' lateral: 6.9 LV SV:         32 LV SV Index:   17 LVOT Area:     3.80 cm   RIGHT VENTRICLE            IVC RV S prime:     8.38 cm/s  IVC diam: 1.50 cm TAPSE (M-mode): 2.0 cm  LEFT ATRIUM             Index       RIGHT ATRIUM          Index LA diam:        2.10 cm 1.15 cm/m  RA Area:     5.37 cm LA Vol (A2C):   10.1 ml 5.52 ml/m  RA Volume:   7.86 ml  4.29 ml/m LA Vol (A4C):   13.2 ml 7.21 ml/m LA Biplane Vol: 11.5 ml 6.28 ml/m AORTIC VALVE LVOT Vmax:   63.90 cm/s LVOT Vmean:  41.900 cm/s LVOT VTI:    0.083 m  AORTA Ao Root diam: 3.00 cm  MITRAL VALVE MV Area (PHT): 4.77 cm    SHUNTS MV Decel Time: 159 msec    Systemic VTI:  0.08 m MV E velocity: 65.50 cm/s  Systemic Diam: 2.20 cm  Karyl Paget Croitoru MD Electronically signed by Luana Rumple MD Signature Date/Time: 02/07/2024/12:39:31  PM    Final          ______________________________________________________________________________________________       Current Reported Medications:.    No outpatient medications have been marked as taking for the 02/17/24 encounter (Appointment) with Shonita Rinck D, NP.    Physical Exam:  VS:  There were no vitals taken for this visit.   Wt Readings from Last 3 Encounters:  02/10/24 132 lb 11.5 oz (60.2 kg)  05/28/23 147 lb 11.3 oz (67 kg)  05/24/23 148 lb 3.2 oz (67.2 kg)    GEN: Well nourished, well developed in no acute distress NECK: No JVD; No carotid bruits CARDIAC: ***RRR, no murmurs, rubs, gallops RESPIRATORY:  Clear to auscultation without rales, wheezing or rhonchi  ABDOMEN: Soft, non-tender, non-distended EXTREMITIES:  No edema; No acute deformity     Asessement and Plan:.     ***     Disposition: F/u with ***  Signed, Jhonnie Aliano D Prima Rayner, NP

## 2024-02-17 ENCOUNTER — Ambulatory Visit: Payer: Self-pay | Attending: Cardiology | Admitting: Cardiology

## 2024-02-17 VITALS — BP 100/78 | HR 85 | Ht 73.0 in | Wt 137.4 lb

## 2024-02-17 DIAGNOSIS — E782 Mixed hyperlipidemia: Secondary | ICD-10-CM

## 2024-02-17 DIAGNOSIS — E860 Dehydration: Secondary | ICD-10-CM

## 2024-02-17 DIAGNOSIS — I255 Ischemic cardiomyopathy: Secondary | ICD-10-CM

## 2024-02-17 DIAGNOSIS — E876 Hypokalemia: Secondary | ICD-10-CM

## 2024-02-17 DIAGNOSIS — I2510 Atherosclerotic heart disease of native coronary artery without angina pectoris: Secondary | ICD-10-CM

## 2024-02-17 DIAGNOSIS — I214 Non-ST elevation (NSTEMI) myocardial infarction: Secondary | ICD-10-CM

## 2024-02-17 MED ORDER — NITROGLYCERIN 0.4 MG SL SUBL
0.4000 mg | SUBLINGUAL_TABLET | SUBLINGUAL | 2 refills | Status: AC | PRN
Start: 2024-02-17 — End: ?

## 2024-02-17 MED ORDER — PRASUGREL HCL 10 MG PO TABS: 10.0000 mg | ORAL_TABLET | Freq: Every day | ORAL | 3 refills | Status: AC

## 2024-02-17 MED ORDER — PANTOPRAZOLE SODIUM 40 MG PO TBEC: 40.0000 mg | DELAYED_RELEASE_TABLET | Freq: Every day | ORAL | 3 refills | Status: AC

## 2024-02-17 MED ORDER — ATORVASTATIN CALCIUM 80 MG PO TABS: 80.0000 mg | ORAL_TABLET | Freq: Every day | ORAL | 3 refills | Status: DC

## 2024-02-17 NOTE — Patient Instructions (Signed)
 Medication Instructions:  No changes *If you need a refill on your cardiac medications before your next appointment, please call your pharmacy*  Lab Work: Today we are going to draw a Bmet In 6-8 weeks we are going to need a fasting lipid panel and Lft's If you have labs (blood work) drawn today and your tests are completely normal, you will receive your results only by: MyChart Message (if you have MyChart) OR A paper copy in the mail If you have any lab test that is abnormal or we need to change your treatment, we will call you to review the results.  Testing/Procedures: No testing  Follow-Up: At [facility], you and your health needs are our priority.  As part of our continuing mission to provide you with exceptional heart care, our providers are all part of one team.  This team includes your primary Cardiologist (physician) and Advanced Practice Providers or APPs (Physician Assistants and Nurse Practitioners) who all work together to provide you with the care you need, when you need it.  Your next appointment:   2-3 month(s)  Provider:   Katlyn West, NP or Any APP  We recommend signing up for the patient portal called MyChart.  Sign up information is provided on this After Visit Summary.  MyChart is used to connect with patients for Virtual Visits (Telemedicine).  Patients are able to view lab/test results, encounter notes, upcoming appointments, etc.  Non-urgent messages can be sent to your provider as well.   To learn more about what you can do with MyChart, go to [email or web address].

## 2024-02-18 ENCOUNTER — Encounter: Payer: Self-pay | Admitting: Cardiology

## 2024-04-04 ENCOUNTER — Ambulatory Visit: Payer: Self-pay | Admitting: Cardiology

## 2024-04-04 LAB — LIPID PANEL
Chol/HDL Ratio: 2.3 ratio (ref 0.0–5.0)
Cholesterol, Total: 178 mg/dL (ref 100–199)
HDL: 76 mg/dL (ref >39)
LDL Chol Calc (NIH): 80 mg/dL (ref 0–99)
Triglycerides: 127 mg/dL (ref 0–149)
VLDL Cholesterol Cal: 22 mg/dL (ref 5–40)

## 2024-04-04 LAB — BASIC METABOLIC PANEL WITH GFR
BUN/Creatinine Ratio: 16 (ref 9–20)
BUN: 14 mg/dL (ref 6–24)
CO2: 20 mmol/L (ref 20–29)
Calcium: 8.8 mg/dL (ref 8.7–10.2)
Chloride: 105 mmol/L (ref 96–106)
Creatinine, Ser: 0.85 mg/dL (ref 0.76–1.27)
Glucose: 69 mg/dL — ABNORMAL LOW (ref 70–99)
Potassium: 4.4 mmol/L (ref 3.5–5.2)
Sodium: 139 mmol/L (ref 134–144)
eGFR: 101 mL/min/1.73 (ref >59)

## 2024-04-04 LAB — HEPATIC FUNCTION PANEL
ALT: 19 IU/L (ref 0–44)
AST: 20 IU/L (ref 0–40)
Albumin: 4.2 g/dL (ref 3.8–4.9)
Alkaline Phosphatase: 79 IU/L (ref 44–121)
Bilirubin Total: 0.3 mg/dL (ref 0.0–1.2)
Bilirubin, Direct: 0.12 mg/dL (ref 0.00–0.40)
Total Protein: 7 g/dL (ref 6.0–8.5)

## 2024-04-09 NOTE — Telephone Encounter (Signed)
 Called patient advised of below they verbalized understanding Patient would like to think about starting the new medication will call him back on Friday to get his answer

## 2024-04-09 NOTE — Telephone Encounter (Signed)
-----   Message from Katlyn D [place] sent at 04/04/2024 10:49 AM EDT ----- Please let Mr. Halls know that his kidney function and electrolytes are normal. His liver function is normal. His LDL while improved remains above goal, recommend starting Ezetimibe 10 mg daily. He  will need repeat fasting lipid profile and LFTs in 6-8 weeks.  ----- Message ----- From: Rebecka Memos Lab Results In Sent: 04/04/2024   5:37 AM EDT To: Katlyn D West, NP

## 2024-04-17 MED ORDER — EZETIMIBE 10 MG PO TABS: 10.0000 mg | ORAL_TABLET | Freq: Every day | ORAL | 3 refills | Status: DC

## 2024-04-17 NOTE — Telephone Encounter (Signed)
 Called patient they are going to start the new medication

## 2024-04-17 NOTE — Telephone Encounter (Deleted)
-----   Message from Katlyn D [place] sent at 04/04/2024 10:49 AM EDT ----- Please let Mr. Halls know that his kidney function and electrolytes are normal. His liver function is normal. His LDL while improved remains above goal, recommend starting Ezetimibe 10 mg daily. He  will need repeat fasting lipid profile and LFTs in 6-8 weeks.  ----- Message ----- From: Rebecka Memos Lab Results In Sent: 04/04/2024   5:37 AM EDT To: Katlyn D West, NP

## 2024-04-23 NOTE — Progress Notes (Deleted)
 Cardiology Office Note    Date:  04/23/2024  ID:  Lee Greer, DOB 23-Apr-1966, MRN [id number] PCP:  Theotis Haze ORN, NP  Cardiologist:  [name], MD  Electrophysiologist:  None   Chief Complaint: Follow up for CAD   History of Present Illness: .    Lee Greer is a 58 y.o. male with visit-pertinent history of cannabinoid hyperemesis syndrome, esophageal tear, gastric ulcers, gastroparesis, CAD.  Patient presented on 6/2 complaining of nausea and vomiting that been ongoing for 3 days, also reported having 2 episodes of substernal chest discomfort each lasting about 15 minutes.  In the ED initial troponin [id number].  Drug screen positive for THC.  Urinalysis with no bacteria, moderate hemoglobin.  Lactic acid 3.1>2.6.  Chest x-ray showed chronic hyperinflation, no acute findings.  Initial EKG in the ED showed sinus tachycardia with heart rate 114 bpm, biatrial enlargement, LVH, ST depression in V3 through V5.  In the ED patient was given Phenergan  for nausea/vomiting.  He was also started on IV heparin  with elevated troponin.  Patient reported that early on Friday morning he was resting watching television when he had sudden onset chest pain with associated diaphoresis.  He denied shortness of breath, nausea, syncope, near syncope.  After 15 minutes the pain went without sound.  Later in the day he had another episode of similar chest pain that lasted about 15 minutes and resolved on its own.  On Saturday patient had a large meal and started vomiting, he was unable to keep water or ice chips down so decided come to the ED.  Dehydration and nausea medications.  Patient's highest of the troponin peaked at 19,818, underwent cardiac catheterization with successful PCI/DES x 2 to the mid circumflex extending into OM1, procedure was complicated by abrupt vessel closure, however good result at the end of the case.  He then had a staged intervention PCI/DES to the  mid RCA with recommendations for DAPT with aspirin /Effient  for at least 1 year.  Post MI noted to have accelerated junctional rhythm with heart rate in the 120s, resolved.  Echocardiogram indicated LVEF of 45 to 50%, severe hypokinesis of the LV, basal inferior and inferior lateral wall with mildly reduced RV.  GDMT in hospital was limited by low blood pressure.  Patient was seen in clinic on 02/17/2024 for follow-up.  He reports that he was doing very well, he did not any chest pain, shortness of breath, lower extreme edema, orthopnea or PND.  He reported that he had resumed his normal activity and was tolerating very well.  GDMT was limited by low blood pressure.  Today he presents for follow-up.  He reports that he  CAD: Patient admitted on 02/06/2024 with chest pain, high sensitive troponin peaked at 19,818.  Underwent cardiac cath with successful PCI/DES x 2 in the mid circumflex extending into OM1, procedure complicated by abrupt vessel closure however had good result in the case.  He underwent staged intervention of PCI/DES to mid RCA.  To continue DAPT with aspirin  and Effient  for at least 1 year. Today he reports that he is doing well, he denies any chest pain or shortness of breath.  He reports that he has returned to his normal activity, he reports he is not interested in cardiac rehab at this time.  His right radial cath site is clean and intact without evidence hematoma.  Continue aspirin  81 mg daily, Lipitor 80 mg daily and Effient  10 mg daily.  Refills provided.  ICM: Echocardiogram during admission indicated LVEF of 45 to 30%, severe hypokinesis of the LV, basal inferior and inferolateral wall with mildly reduced RV.  GDMT while in hospital was limited by low blood pressures.  Patient is doing very well, denies any increased lower extremity edema, orthopnea or PND.  Denies any shortness of breath.  Patient reports his weights have been stable.  GDMT limited by low blood pressures.     Hyperlipidemia: LDL 147 during admission.  Started on atorvastatin  80 mg daily. Check fasting lipid provide LFTs in 6 to 8 weeks. Refill provided.   Labwork independently reviewed:   ROS: .   *** denies chest pain, shortness of breath, lower extremity edema, fatigue, palpitations, melena, hematuria, hemoptysis, diaphoresis, weakness, presyncope, syncope, orthopnea, and PND.  All other systems are reviewed and otherwise negative.  Studies Reviewed: SABRA    EKG:  EKG is ordered today, personally reviewed, demonstrating ***     CV Studies: Cardiac studies reviewed are outlined and summarized above. Otherwise please see EMR for full report. Cardiac Studies & Procedures   ______________________________________________________________________________________________ CARDIAC CATHETERIZATION  CARDIAC CATHETERIZATION 02/09/2024  Conclusion Images from the original result were not included. Coronary intervention 02/09/2024: (See separate coronary angiogram and intervention note dated 02/07/2024) RCA: Proximal diffuse 50%, mid 80%, distal focal 70% stenoses  Successful percutaneous coronary intervention mid RCA PTCA and stent placement 3.0 X 20 mm Synergy drug-eluting stent    Recommend medical management for residual mild to moderate disease.   [name], MD  Findings Coronary Findings Diagnostic  Dominance: Right  Left Anterior Descending Mid LAD-1 lesion is 40% stenosed. Mid LAD-2 lesion is 30% stenosed.  Left Circumflex Non-stenotic Prox Cx to Mid Cx lesion was previously treated. The lesion is thrombotic.  First Obtuse Marginal Branch Non-stenotic 1st Mrg lesion was previously treated. The lesion is thrombotic.  Right Coronary Artery Prox RCA lesion is 50% stenosed. Mid RCA lesion is 80% stenosed. Dist RCA lesion is 70% stenosed.  Intervention  No interventions have been documented.   CARDIAC CATHETERIZATION 02/07/2024  Conclusion Images from the original  result were not included. Coronary angiography & intervention 02/07/2024: LM: Normal LAD: Mid 40%, 30% stenoses Lcx: Prox thrombotic 95% stenosis extending into OM1 and mid LCx RCA: Prox 80% stenosis  LVEDP 2 mmHg  Successful percutaneous coronary intervention Lcx/OM PTCA and stent placement OM1 2.5 X 48 mm Synergy drug-eluting stent Post dilated up with [place] balloons up to 4.0 X 15 mm at 16 atm PTCA LCx 2.5 X 15 mm balloon at 8 atm     Unusually long and challenging procedure due to use of destination sheath, abrupt vessel closure and dissection requiring use of multiple wires, bifurcation stenting with provisional stenting approach.  Contrast used: 240 cc Will need await recovery from this procedure and reassessment of renal function tomorrow before decision on addressing prox RCA severe lesion.  [name], MD  Findings Coronary Findings Diagnostic  Dominance: Right  Left Anterior Descending Mid LAD-1 lesion is 40% stenosed. Mid LAD-2 lesion is 30% stenosed.  Left Circumflex Prox Cx to Mid Cx lesion is 95% stenosed. The lesion is thrombotic.  First Obtuse Marginal Branch 1st Mrg lesion is 95% stenosed. The lesion is thrombotic.  Right Coronary Artery Prox RCA to Mid RCA lesion is 80% stenosed.  Intervention  Prox Cx to Mid Cx lesion Provisional (Also treats lesions: 1st Mrg) Provisional stenting was successfully performed in the main branch from the Ost Cx to the Prox Cx and  in the side branch at the 1st Mrg. Angioplasty independent of stent deployment was performed using a BALLOON EMERGE MR 2.5X15 non-compliant balloon in the side branch. Maximum pressure: 8 atm. Inflation time: 15 sec. A STENT SYNERGY XD [id number] drug-eluting stent was successfully placed. Maximum pressure: 14 atm. Inflation time: 30 sec. Post-stent angioplasty was performed in the main branch and side branch. A BALLOON SAPPHIRE NC24 4.0X15 non-compliant balloon was used in the main branch.  Maximum pressure: 16 atm. Inflation time: 20 sec. A BALLOON EMERGE MR 2.5X15 semi-compliant balloon was used in the side branch. Maximum pressure: 8 atm. Inflation time: 15 sec. Angioplasty Balloon angioplasty was performed using a BALLOON EMERGE MR 2.5X15. Maximum pressure: 8 atm. Inflation time: 15 sec. Post-Intervention Lesion Assessment The intervention was successful. Pre-interventional TIMI flow is 0. Post-intervention TIMI flow is 3. No complications occurred at this lesion. There is a 0% residual stenosis post intervention.  1st Mrg lesion Provisional (Also treats lesions: Prox Cx to Mid Cx) Provisional stenting was successfully performed in the main branch from the Ost Cx to the Prox Cx and in the side branch at the 1st Mrg. Angioplasty independent of stent deployment was performed using a BALLOON EMERGE MR 2.5X15 non-compliant balloon in the side branch. Maximum pressure: 8 atm. Inflation time: 15 sec. A STENT SYNERGY XD [id number] drug-eluting stent was successfully placed. Maximum pressure: 14 atm. Inflation time: 30 sec. Post-stent angioplasty was performed in the main branch and side branch. A BALLOON SAPPHIRE NC24 4.0X15 non-compliant balloon was used in the main branch. Maximum pressure: 16 atm. Inflation time: 20 sec. A BALLOON EMERGE MR 2.5X15 semi-compliant balloon was used in the side branch. Maximum pressure: 8 atm. Inflation time: 15 sec. Post-Intervention Lesion Assessment The intervention was successful. Pre-interventional TIMI flow is 0. Post-intervention TIMI flow is 3. There is a 0% residual stenosis post intervention.     ECHOCARDIOGRAM  ECHOCARDIOGRAM COMPLETE 02/07/2024  Narrative ECHOCARDIOGRAM REPORT    Patient Name:   DEVINE KLINGEL Date of Exam: 02/07/2024 Medical Rec #:  [id number]         Height:       72.0 in Accession #:    [id number]        Weight:       140.0 lb Date of Birth:  1966/08/05         BSA:          1.831 m Patient Age:    58 years           BP:           132/111 mmHg Patient Gender: M                 HR:           101 bpm. Exam Location:  Inpatient  Procedure: 2D Echo, Cardiac Doppler, Color Doppler and Intracardiac Opacification Agent (Both Spectral and Color Flow Doppler were utilized during procedure).  Indications:    NSTEMI  History:        Patient has no prior history of Echocardiogram examinations.  Sonographer:    Philomena Daring Referring Phys: [id number] NKIRU C OSUDE  IMPRESSIONS   1. Findings consistent with ischemia/infarction in the right coronary artery territory, with compensatory hyperdynamic contractility in other wall segments. Left ventricular ejection fraction, by estimation, is 45 to 50%. The left ventricle has mildly decreased function. The left ventricle demonstrates regional wall motion abnormalities (see scoring diagram/findings for description). Left ventricular diastolic parameters were normal. Elevated left  ventricular end-diastolic pressure. There is severe hypokinesis of the left ventricular, basal-mid inferior wall, inferolateral wall and inferoseptal wall. 2. Right ventricular systolic function is mildly reduced. The right ventricular size is normal. Tricuspid regurgitation signal is inadequate for assessing PA pressure. 3. The mitral valve is normal in structure. Mild to moderate mitral valve regurgitation. No evidence of mitral stenosis. 4. The aortic valve is tricuspid. Aortic valve regurgitation is not visualized. No aortic stenosis is present. 5. The inferior vena cava is normal in size with greater than 50% respiratory variability, suggesting right atrial pressure of 3 mmHg.  FINDINGS Left Ventricle: Findings consistent with ischemia/infarction in the right coronary artery territory, with compensatory hyperdynamic contractility in other wall segments. Left ventricular ejection fraction, by estimation, is 45 to 50%. The left ventricle has mildly decreased function. The left ventricle  demonstrates regional wall motion abnormalities. Severe hypokinesis of the left ventricular, basal-mid inferior wall, inferolateral wall and inferoseptal wall. The left ventricular internal cavity size was normal in size. There is no left ventricular hypertrophy. Left ventricular diastolic parameters were normal. Elevated left ventricular end-diastolic pressure.   LV Wall Scoring: The inferior wall, posterior wall, mid inferoseptal segment, and basal inferoseptal segment are hypokinetic.  Right Ventricle: The right ventricular size is normal. No increase in right ventricular wall thickness. Right ventricular systolic function is mildly reduced. Tricuspid regurgitation signal is inadequate for assessing PA pressure.  Left Atrium: Left atrial size was normal in size.  Right Atrium: Right atrial size was normal in size.  Pericardium: There is no evidence of pericardial effusion.  Mitral Valve: The mitral valve is normal in structure. Mild to moderate mitral valve regurgitation, with centrally-directed jet. No evidence of mitral valve stenosis.  Tricuspid Valve: The tricuspid valve is normal in structure. Tricuspid valve regurgitation is not demonstrated. No evidence of tricuspid stenosis.  Aortic Valve: The aortic valve is tricuspid. Aortic valve regurgitation is not visualized. No aortic stenosis is present.  Pulmonic Valve: The pulmonic valve was not well visualized. Pulmonic valve regurgitation is not visualized. No evidence of pulmonic stenosis.  Aorta: The aortic root is normal in size and structure.  Venous: The inferior vena cava is normal in size with greater than 50% respiratory variability, suggesting right atrial pressure of 3 mmHg.  IAS/Shunts: No atrial level shunt detected by color flow Doppler.   LEFT VENTRICLE PLAX 2D LVIDd:         4.00 cm   Diastology LVIDs:         2.90 cm   LV e' medial:    7.62 cm/s LV PW:         0.90 cm   LV E/e' medial:  8.6 LV IVS:         0.90 cm   LV e' lateral:   9.46 cm/s LVOT diam:     2.20 cm   LV E/e' lateral: 6.9 LV SV:         32 LV SV Index:   17 LVOT Area:     3.80 cm   RIGHT VENTRICLE            IVC RV S prime:     8.38 cm/s  IVC diam: 1.50 cm TAPSE (M-mode): 2.0 cm  LEFT ATRIUM             Index       RIGHT ATRIUM          Index LA diam:        2.10 cm 1.15  cm/m  RA Area:     5.37 cm LA Vol (A2C):   10.1 ml 5.52 ml/m  RA Volume:   7.86 ml  4.29 ml/m LA Vol (A4C):   13.2 ml 7.21 ml/m LA Biplane Vol: 11.5 ml 6.28 ml/m AORTIC VALVE LVOT Vmax:   63.90 cm/s LVOT Vmean:  41.900 cm/s LVOT VTI:    0.083 m  AORTA Ao Root diam: 3.00 cm  MITRAL VALVE MV Area (PHT): 4.77 cm    SHUNTS MV Decel Time: 159 msec    Systemic VTI:  0.08 m MV E velocity: 65.50 cm/s  Systemic Diam: 2.20 cm  Jerel Croitoru MD Electronically signed by Jerel Balding MD Signature Date/Time: 02/07/2024/12:39:31 PM    Final          ______________________________________________________________________________________________       Current Reported Medications:.    No outpatient medications have been marked as taking for the 04/24/24 encounter (Appointment) with Dusten Ellinwood D, NP.    Physical Exam:    VS:  There were no vitals taken for this visit.   Wt Readings from Last 3 Encounters:  02/17/24 137 lb 6.4 oz (62.3 kg)  02/10/24 132 lb 11.5 oz (60.2 kg)  05/28/23 147 lb 11.3 oz (67 kg)    GEN: Well nourished, well developed in no acute distress NECK: No JVD; No carotid bruits CARDIAC: ***RRR, no murmurs, rubs, gallops RESPIRATORY:  Clear to auscultation without rales, wheezing or rhonchi  ABDOMEN: Soft, non-tender, non-distended EXTREMITIES:  No edema; No acute deformity     Asessement and Plan:.     ***     Disposition: F/u with ***  Signed, Bena Kobel D Lucyle Alumbaugh, NP

## 2024-04-24 ENCOUNTER — Ambulatory Visit: Payer: Self-pay | Admitting: Cardiology

## 2024-05-30 NOTE — Progress Notes (Unsigned)
 Cardiology Office Note    Date:  05/31/2024  ID:  [name], Eyerman Oct 24, 1965, MRN [id number] PCP:  Theotis Haze ORN, NP  Cardiologist:  [name], MD  Electrophysiologist:  None   Chief Complaint: Follow up for CAD   History of Present Illness: .   Lee Greer is a 58 y.o. male with visit-pertinent history of cannabinoid hyperemesis syndrome, esophageal tear, gastric ulcers, gastroparesis, CAD.   Patient presented on 6/2 complaining of nausea and vomiting that been ongoing for 3 days, also reported having 2 episodes of substernal chest discomfort each lasting about 15 minutes.  In the ED initial troponin [phone].  Drug screen positive for THC.  Urinalysis with no bacteria, moderate hemoglobin.  Lactic acid 3.1>2.6.  Chest x-ray showed chronic hyperinflation, no acute findings.  Initial EKG in the ED showed sinus tachycardia with heart rate 114 bpm, biatrial enlargement, LVH, ST depression in V3 through V5.   In the ED patient was given Phenergan  for nausea/vomiting.  He was also started on IV heparin  with elevated troponin.   Patient reported that early on Friday morning he was resting watching television when he had sudden onset chest pain with associated diaphoresis.  He denied shortness of breath, nausea, syncope, near syncope.  After 15 minutes the pain went without sound.  Later in the day he had another episode of similar chest pain that lasted about 15 minutes and resolved on its own.  On Saturday patient had a large meal and started vomiting, he was unable to keep water or ice chips down so decided come to the ED.  Dehydration and nausea medications.  Patient's highest of the troponin peaked at 19,818, underwent cardiac catheterization with successful PCI/DES x 2 to the mid circumflex extending into OM1, procedure was complicated by abrupt vessel closure, however good result at the end of the case.  He then had a staged intervention PCI/DES to  the mid RCA with recommendations for DAPT with aspirin /Effient  for at least 1 year.  Post MI noted to have accelerated junctional rhythm with heart rate in the 120s, resolved.  Echocardiogram indicated LVEF of 45 to 50%, severe hypokinesis of the LV, basal inferior and inferior lateral wall with mildly reduced RV.  GDMT in hospital was limited by low blood pressure.   Patient was seen in clinic on 02/17/2024 for follow-up.  He reports that he was doing very well, he did not any chest pain, shortness of breath, lower extreme edema, orthopnea or PND.  He reported that he had resumed his normal activity and was tolerating very well.  GDMT was limited by low blood pressure.   Today he presents for follow-up.  He reports that he has been doing well overall, he denies chest pain, shortness of breath, lower extremity edema, orthopnea or PND.  He denies any palpitations, presyncope or syncope.  He reports that he is exercising and lifting weights every other day, is tolerating very well, he has been working to gain weight and increase muscle mass.  Patient notes that his biggest concern is intermittent cramping in his legs which he feels is related to his statin medication. ROS: .   Today he denies chest pain, shortness of breath, lower extremity edema, fatigue, palpitations, melena, hematuria, hemoptysis, diaphoresis, weakness, presyncope, syncope, orthopnea, and PND.  All other systems are reviewed and otherwise negative. Studies Reviewed: SABRA   EKG:  EKG is not ordered today.  CV Studies: Cardiac studies reviewed are outlined and summarized above.  Otherwise please see EMR for full report. Cardiac Studies & Procedures   ______________________________________________________________________________________________ CARDIAC CATHETERIZATION  CARDIAC CATHETERIZATION 02/09/2024  Conclusion Images from the original result were not included. Coronary intervention 02/09/2024: (See separate coronary angiogram and  intervention note dated 02/07/2024) RCA: Proximal diffuse 50%, mid 80%, distal focal 70% stenoses  Successful percutaneous coronary intervention mid RCA PTCA and stent placement 3.0 X 20 mm Synergy drug-eluting stent    Recommend medical management for residual mild to moderate disease.   [name], MD  Findings Coronary Findings Diagnostic  Dominance: Right  Left Anterior Descending Mid LAD-1 lesion is 40% stenosed. Mid LAD-2 lesion is 30% stenosed.  Left Circumflex Non-stenotic Prox Cx to Mid Cx lesion was previously treated. The lesion is thrombotic.  First Obtuse Marginal Branch Non-stenotic 1st Mrg lesion was previously treated. The lesion is thrombotic.  Right Coronary Artery Prox RCA lesion is 50% stenosed. Mid RCA lesion is 80% stenosed. Dist RCA lesion is 70% stenosed.  Intervention  No interventions have been documented.   CARDIAC CATHETERIZATION 02/07/2024  Conclusion Images from the original result were not included. Coronary angiography & intervention 02/07/2024: LM: Normal LAD: Mid 40%, 30% stenoses Lcx: Prox thrombotic 95% stenosis extending into OM1 and mid LCx RCA: Prox 80% stenosis  LVEDP 2 mmHg  Successful percutaneous coronary intervention Lcx/OM PTCA and stent placement OM1 2.5 X 48 mm Synergy drug-eluting stent Post dilated up with [place] balloons up to 4.0 X 15 mm at 16 atm PTCA LCx 2.5 X 15 mm balloon at 8 atm     Unusually long and challenging procedure due to use of destination sheath, abrupt vessel closure and dissection requiring use of multiple wires, bifurcation stenting with provisional stenting approach.  Contrast used: 240 cc Will need await recovery from this procedure and reassessment of renal function tomorrow before decision on addressing prox RCA severe lesion.  [name], MD  Findings Coronary Findings Diagnostic  Dominance: Right  Left Anterior Descending Mid LAD-1 lesion is 40% stenosed. Mid LAD-2  lesion is 30% stenosed.  Left Circumflex Prox Cx to Mid Cx lesion is 95% stenosed. The lesion is thrombotic.  First Obtuse Marginal Branch 1st Mrg lesion is 95% stenosed. The lesion is thrombotic.  Right Coronary Artery Prox RCA to Mid RCA lesion is 80% stenosed.  Intervention  Prox Cx to Mid Cx lesion Provisional (Also treats lesions: 1st Mrg) Provisional stenting was successfully performed in the main branch from the Ost Cx to the Prox Cx and in the side branch at the 1st Mrg. Angioplasty independent of stent deployment was performed using a BALLOON EMERGE MR 2.5X15 non-compliant balloon in the side branch. Maximum pressure: 8 atm. Inflation time: 15 sec. A STENT SYNERGY XD [id number] drug-eluting stent was successfully placed. Maximum pressure: 14 atm. Inflation time: 30 sec. Post-stent angioplasty was performed in the main branch and side branch. A BALLOON SAPPHIRE NC24 4.0X15 non-compliant balloon was used in the main branch. Maximum pressure: 16 atm. Inflation time: 20 sec. A BALLOON EMERGE MR 2.5X15 semi-compliant balloon was used in the side branch. Maximum pressure: 8 atm. Inflation time: 15 sec. Angioplasty Balloon angioplasty was performed using a BALLOON EMERGE MR 2.5X15. Maximum pressure: 8 atm. Inflation time: 15 sec. Post-Intervention Lesion Assessment The intervention was successful. Pre-interventional TIMI flow is 0. Post-intervention TIMI flow is 3. No complications occurred at this lesion. There is a 0% residual stenosis post intervention.  1st Mrg lesion Provisional (Also treats lesions: Prox Cx to Mid Cx) Provisional stenting  was successfully performed in the main branch from the Ost Cx to the Prox Cx and in the side branch at the 1st Mrg. Angioplasty independent of stent deployment was performed using a BALLOON EMERGE MR 2.5X15 non-compliant balloon in the side branch. Maximum pressure: 8 atm. Inflation time: 15 sec. A STENT SYNERGY XD [id number] drug-eluting stent was  successfully placed. Maximum pressure: 14 atm. Inflation time: 30 sec. Post-stent angioplasty was performed in the main branch and side branch. A BALLOON SAPPHIRE NC24 4.0X15 non-compliant balloon was used in the main branch. Maximum pressure: 16 atm. Inflation time: 20 sec. A BALLOON EMERGE MR 2.5X15 semi-compliant balloon was used in the side branch. Maximum pressure: 8 atm. Inflation time: 15 sec. Post-Intervention Lesion Assessment The intervention was successful. Pre-interventional TIMI flow is 0. Post-intervention TIMI flow is 3. There is a 0% residual stenosis post intervention.     ECHOCARDIOGRAM  ECHOCARDIOGRAM COMPLETE 02/07/2024  Narrative ECHOCARDIOGRAM REPORT    Patient Name:   Lee Greer Date of Exam: 02/07/2024 Medical Rec #:  [id number]         Height:       72.0 in Accession #:    [id number]        Weight:       140.0 lb Date of Birth:  Mar 06, 1966         BSA:          1.831 m Patient Age:    58 years          BP:           132/111 mmHg Patient Gender: M                 HR:           101 bpm. Exam Location:  Inpatient  Procedure: 2D Echo, Cardiac Doppler, Color Doppler and Intracardiac Opacification Agent (Both Spectral and Color Flow Doppler were utilized during procedure).  Indications:    NSTEMI  History:        Patient has no prior history of Echocardiogram examinations.  Sonographer:    Philomena Daring Referring Phys: [id number] NKIRU C OSUDE  IMPRESSIONS   1. Findings consistent with ischemia/infarction in the right coronary artery territory, with compensatory hyperdynamic contractility in other wall segments. Left ventricular ejection fraction, by estimation, is 45 to 50%. The left ventricle has mildly decreased function. The left ventricle demonstrates regional wall motion abnormalities (see scoring diagram/findings for description). Left ventricular diastolic parameters were normal. Elevated left ventricular end-diastolic pressure. There is  severe hypokinesis of the left ventricular, basal-mid inferior wall, inferolateral wall and inferoseptal wall. 2. Right ventricular systolic function is mildly reduced. The right ventricular size is normal. Tricuspid regurgitation signal is inadequate for assessing PA pressure. 3. The mitral valve is normal in structure. Mild to moderate mitral valve regurgitation. No evidence of mitral stenosis. 4. The aortic valve is tricuspid. Aortic valve regurgitation is not visualized. No aortic stenosis is present. 5. The inferior vena cava is normal in size with greater than 50% respiratory variability, suggesting right atrial pressure of 3 mmHg.  FINDINGS Left Ventricle: Findings consistent with ischemia/infarction in the right coronary artery territory, with compensatory hyperdynamic contractility in other wall segments. Left ventricular ejection fraction, by estimation, is 45 to 50%. The left ventricle has mildly decreased function. The left ventricle demonstrates regional wall motion abnormalities. Severe hypokinesis of the left ventricular, basal-mid inferior wall, inferolateral wall and inferoseptal wall. The left ventricular internal cavity size was normal in size.  There is no left ventricular hypertrophy. Left ventricular diastolic parameters were normal. Elevated left ventricular end-diastolic pressure.   LV Wall Scoring: The inferior wall, posterior wall, mid inferoseptal segment, and basal inferoseptal segment are hypokinetic.  Right Ventricle: The right ventricular size is normal. No increase in right ventricular wall thickness. Right ventricular systolic function is mildly reduced. Tricuspid regurgitation signal is inadequate for assessing PA pressure.  Left Atrium: Left atrial size was normal in size.  Right Atrium: Right atrial size was normal in size.  Pericardium: There is no evidence of pericardial effusion.  Mitral Valve: The mitral valve is normal in structure. Mild to moderate  mitral valve regurgitation, with centrally-directed jet. No evidence of mitral valve stenosis.  Tricuspid Valve: The tricuspid valve is normal in structure. Tricuspid valve regurgitation is not demonstrated. No evidence of tricuspid stenosis.  Aortic Valve: The aortic valve is tricuspid. Aortic valve regurgitation is not visualized. No aortic stenosis is present.  Pulmonic Valve: The pulmonic valve was not well visualized. Pulmonic valve regurgitation is not visualized. No evidence of pulmonic stenosis.  Aorta: The aortic root is normal in size and structure.  Venous: The inferior vena cava is normal in size with greater than 50% respiratory variability, suggesting right atrial pressure of 3 mmHg.  IAS/Shunts: No atrial level shunt detected by color flow Doppler.   LEFT VENTRICLE PLAX 2D LVIDd:         4.00 cm   Diastology LVIDs:         2.90 cm   LV e' medial:    7.62 cm/s LV PW:         0.90 cm   LV E/e' medial:  8.6 LV IVS:        0.90 cm   LV e' lateral:   9.46 cm/s LVOT diam:     2.20 cm   LV E/e' lateral: 6.9 LV SV:         32 LV SV Index:   17 LVOT Area:     3.80 cm   RIGHT VENTRICLE            IVC RV S prime:     8.38 cm/s  IVC diam: 1.50 cm TAPSE (M-mode): 2.0 cm  LEFT ATRIUM             Index       RIGHT ATRIUM          Index LA diam:        2.10 cm 1.15 cm/m  RA Area:     5.37 cm LA Vol (A2C):   10.1 ml 5.52 ml/m  RA Volume:   7.86 ml  4.29 ml/m LA Vol (A4C):   13.2 ml 7.21 ml/m LA Biplane Vol: 11.5 ml 6.28 ml/m AORTIC VALVE LVOT Vmax:   63.90 cm/s LVOT Vmean:  41.900 cm/s LVOT VTI:    0.083 m  AORTA Ao Root diam: 3.00 cm  MITRAL VALVE MV Area (PHT): 4.77 cm    SHUNTS MV Decel Time: 159 msec    Systemic VTI:  0.08 m MV E velocity: 65.50 cm/s  Systemic Diam: 2.20 cm  Jerel Croitoru MD Electronically signed by Jerel Balding MD Signature Date/Time: 02/07/2024/12:39:31 PM    Final           ______________________________________________________________________________________________       Current Reported Medications:.    Current Meds  Medication Sig   aspirin  EC 81 MG tablet Take 1 tablet (81 mg total) by mouth daily. Swallow whole.  ezetimibe  (ZETIA ) 10 MG tablet Take 1 tablet (10 mg total) by mouth daily.   Multiple Vitamin (MULTIVITAMIN) tablet Take 1 tablet by mouth daily with breakfast.   nitroGLYCERIN  (NITROSTAT ) 0.4 MG SL tablet Place 1 tablet (0.4 mg total) under the tongue every 5 (five) minutes as needed.   prasugrel  (EFFIENT ) 10 MG TABS tablet Take 1 tablet (10 mg total) by mouth daily.   rosuvastatin  (CRESTOR ) 20 MG tablet Take 1 tablet (20 mg total) by mouth daily.   [DISCONTINUED] atorvastatin  (LIPITOR) 80 MG tablet Take 1 tablet (80 mg total) by mouth daily.   Physical Exam:    VS:  BP 102/66   Pulse 68   Ht 6' 1 (1.854 m)   Wt 169 lb (76.7 kg)   SpO2 98%   BMI 22.30 kg/m    Wt Readings from Last 3 Encounters:  05/31/24 169 lb (76.7 kg)  02/17/24 137 lb 6.4 oz (62.3 kg)  02/10/24 132 lb 11.5 oz (60.2 kg)    GEN: Well nourished, well developed in no acute distress NECK: No JVD; No carotid bruits CARDIAC: RRR, no murmurs, rubs, gallops RESPIRATORY:  Clear to auscultation without rales, wheezing or rhonchi  ABDOMEN: Soft, non-tender, non-distended EXTREMITIES:  No edema; No acute deformity     Asessement and Plan:.    CAD: Patient admitted on 02/06/2024 with chest pain, high sensitive troponin peaked at 19,818.  Underwent cardiac cath with successful PCI/DES x 2 in the mid circumflex extending into OM1, procedure complicated by abrupt vessel closure however had good result in the case.  He underwent staged intervention of PCI/DES to mid RCA.  To continue DAPT with aspirin  and Effient  for at least 1 year. Stable with no anginal symptoms. No indication for ischemic evaluation.  Heart healthy diet and regular cardiovascular exercise encouraged.   Reviewed ED precautions. Continue aspirin  81 mg daily, and Effient  10 mg daily.     ICM: Echocardiogram during admission indicated LVEF of 45 to 50%, severe hypokinesis of the LV, basal inferior and inferolateral wall with mildly reduced RV.  GDMT while in hospital was limited by low blood pressures. Today he reports that he is doing well, denies shortness of breath, lower show any edema, orthopnea or PND.  He appears euvolemic and well compensated on exam.  GDMT limited by low blood pressures, patient notes that he is not interested in starting on any further medications.  Reviewed ED precautions.    Hyperlipidemia: Last lipid profile in 04/03/2024 indicated total cholesterol 178, HDL 76, triglycerides 127 and LDL 80.  Patient notes that he has had increased cramping in his legs since starting on statin medication, instructed patient to stop atorvastatin .  He will start rosuvastatin  20 mg daily in 2 weeks, instructed patient to notify the office if cramps persist on rosuvastatin , may need to consider referral to Pharm.D. lipid clinic for PCSK9 inhibitors.  Check LFTs today.  Check fasting lipid profile and LFTs in 8 to 10 weeks.    Disposition: F/u with Dr. [place] or Kennya Schwenn, NP in 6 months or sooner if needed.   Signed, Dietrich Samuelson D Reyaan Thoma, NP

## 2024-05-31 ENCOUNTER — Ambulatory Visit: Payer: Self-pay | Attending: Cardiology | Admitting: Cardiology

## 2024-05-31 ENCOUNTER — Encounter: Payer: Self-pay | Admitting: Cardiology

## 2024-05-31 VITALS — BP 102/66 | HR 68 | Ht 73.0 in | Wt 169.0 lb

## 2024-05-31 DIAGNOSIS — I255 Ischemic cardiomyopathy: Secondary | ICD-10-CM

## 2024-05-31 DIAGNOSIS — I5020 Unspecified systolic (congestive) heart failure: Secondary | ICD-10-CM

## 2024-05-31 DIAGNOSIS — E782 Mixed hyperlipidemia: Secondary | ICD-10-CM

## 2024-05-31 DIAGNOSIS — I2510 Atherosclerotic heart disease of native coronary artery without angina pectoris: Secondary | ICD-10-CM

## 2024-05-31 DIAGNOSIS — I214 Non-ST elevation (NSTEMI) myocardial infarction: Secondary | ICD-10-CM

## 2024-05-31 MED ORDER — ROSUVASTATIN CALCIUM 20 MG PO TABS: 20.0000 mg | ORAL_TABLET | Freq: Every day | ORAL | 3 refills | Status: DC

## 2024-05-31 NOTE — Patient Instructions (Signed)
 Medication Instructions:  Your physician has recommended you make the following change in your medication:   STOP Atorvastatin   IN 2 WEEKS, START Rosuvastatin  20 mg taking 1 daily  *If you need a refill on your cardiac medications before your next appointment, please call your pharmacy*  Lab Work: TODAY:   LFT  DECEMBER 1ST, COME BACK TO THE LAB, FASTING, FOR:  LIPID & LFT   If you have labs (blood work) drawn today and your tests are completely normal, you will receive your results only by: MyChart Message (if you have MyChart) OR A paper copy in the mail If you have any lab test that is abnormal or we need to change your treatment, we will call you to review the results.  Testing/Procedures: Your physician has requested that you have an echocardiogram. Echocardiography is a painless test that uses sound waves to create images of your heart. It provides your doctor with information about the size and shape of your heart and how well your heart's chambers and valves are working. This procedure takes approximately one hour. There are no restrictions for this procedure. Please do NOT wear cologne, perfume, aftershave, or lotions (deodorant is allowed). Please arrive 15 minutes prior to your appointment time.  Please note: We ask at that you not bring children with you during ultrasound (echo/ vascular) testing. Due to room size and safety concerns, children are not allowed in the ultrasound rooms during exams. Our front office staff cannot provide observation of children in our lobby area while testing is being conducted. An adult accompanying a patient to their appointment will only be allowed in the ultrasound room at the discretion of the ultrasound technician under special circumstances. We apologize for any inconvenience.   Follow-Up: At [facility], you and your health needs are our priority.  As part of our continuing mission to provide you with exceptional heart care, our  providers are all part of one team.  This team includes your primary Cardiologist (physician) and Advanced Practice Providers or APPs (Physician Assistants and Nurse Practitioners) who all work together to provide you with the care you need, when you need it.  Your next appointment:   6 month(s)  Provider:   [name], MD or Katlyn West, NP          We recommend signing up for the patient portal called MyChart.  Sign up information is provided on this After Visit Summary.  MyChart is used to connect with patients for Virtual Visits (Telemedicine).  Patients are able to view lab/test results, encounter notes, upcoming appointments, etc.  Non-urgent messages can be sent to your provider as well.   To learn more about what you can do with MyChart, go to [email or web address].   Other Instructions

## 2024-06-01 ENCOUNTER — Ambulatory Visit (HOSPITAL_BASED_OUTPATIENT_CLINIC_OR_DEPARTMENT_OTHER): Payer: Self-pay | Admitting: Cardiology

## 2024-06-01 LAB — HEPATIC FUNCTION PANEL
ALT: 30 IU/L (ref 0–44)
AST: 31 IU/L (ref 0–40)
Albumin: 4.3 g/dL (ref 3.8–4.9)
Alkaline Phosphatase: 79 IU/L (ref 47–123)
Bilirubin Total: 0.5 mg/dL (ref 0.0–1.2)
Bilirubin, Direct: 0.14 mg/dL (ref 0.00–0.40)
Total Protein: 6.9 g/dL (ref 6.0–8.5)

## 2024-06-01 NOTE — Progress Notes (Signed)
 Patient never viewed in mychart. Printed, highlighted doctor comments and mailed to patient

## 2024-07-05 ENCOUNTER — Other Ambulatory Visit: Payer: Self-pay

## 2024-07-05 ENCOUNTER — Telehealth (HOSPITAL_BASED_OUTPATIENT_CLINIC_OR_DEPARTMENT_OTHER): Payer: Self-pay | Admitting: Licensed Clinical Social Worker

## 2024-07-05 DIAGNOSIS — Z789 Other specified health status: Secondary | ICD-10-CM

## 2024-07-05 NOTE — Progress Notes (Signed)
 Heart and Vascular Care Navigation  07/05/2024  Lee Greer [birth year] [id number]  Reason for Referral:  Patient is participating in a Managed Medicaid Plan:no, self pay only  Engaged with patient by telephone for initial visit for Heart and Vascular Care Coordination.                                                                                                   Assessment:                                     LCSW was able to reach pt today at [phone]. Introduced self, role, reason for call. Confirmed full name, DOB, and current home address. Pt significant other remains emergency contact. Shares he was screened over income during hospital stay in June and hasn't had any changes in income since that time. He does not have any coverage currently, none offered through employer. Pt denies any issues with food, housing, utilities, or transportation. Able to get medications without issue but had some issues with cramping even when provider made recent medication change. Shares he planned on stopping by clinic hoping to speak with provider. I shared we do not take walk ins, if any emergent symptoms or pain arises urgent care/ED would be the best course of action but I will route his med concerns to provider.   I shared there are a few options that may help pt with costs related to medical bills, medications and we discussed how to apply for both. Encouraged pt once received to call me with any questions/concerns that may arise. No additional questions at this time.   HRT/VAS Care Coordination     Patients Home Cardiology Office [facility]   Outpatient Care Team Social Worker   Social Worker Name: Marit Lark, [place], [phone]   Living arrangements for the past 2 months Single Family Home   Lives with: Significant Other   Patient Current Insurance Coverage Self-Pay   Patient Has Concern With Paying Medical Bills Yes   Patient Concerns With Medical Bills over  income for Medicaid, no coverage   Medical Bill Referrals: [employer], Marketplace flyer   Does Patient Have Prescription Coverage? No   Patient Prescription Assistance Programs [place] [facility]   Home Assistive Devices/Equipment None   DME Agency NA   [facility] Agency NA       Social History:                                                                             SDOH Screenings   Food Insecurity: No Food Insecurity (07/05/2024)  Housing: Low Risk  (07/05/2024)  Transportation Needs: No Transportation Needs (07/05/2024)  Utilities: Not At Risk (07/05/2024)  Depression (PHQ2-9): Low Risk  (  05/24/2023)  Financial Resource Strain: Medium Risk (07/05/2024)  Tobacco Use: Low Risk  (05/31/2024)  Health Literacy: Adequate Health Literacy (07/05/2024)    SDOH Interventions: Financial Resources:  Surveyor, Quantity Strain Interventions: Artist ([employer]FA; Marketplace insurance support; [employer])  Food Insecurity:  Food Insecurity Interventions: Intervention Not Indicated  Housing Insecurity:  Housing Interventions: Intervention Not Indicated  Transportation:   Transportation Interventions: Intervention Not Indicated   Other Care Navigation Interventions:     Provided Pharmacy assistance resources [place] [facility]   Follow-up plan:   LCSW has sent the following: my card, Furniture Conservator/restorer. LCSW will f/u to ensure these arrive and answer any questions/concerns that may arise when reviewing them.

## 2024-07-05 NOTE — Telephone Encounter (Signed)
 Noted. Spoke with pt. Pt is ok with seeing pharm-d and would like to know other medication options. Pt is aware that he will receive a call from someone in our office to schedule pharm-d apt. Order placed. Please arrange.

## 2024-07-05 NOTE — Telephone Encounter (Signed)
 Please let patient know I would recommend referral to pharmD lipid clinic given intolerance to both rosuvastatin  and atorvastatin  with history of CAD. Thanks!

## 2024-07-09 ENCOUNTER — Ambulatory Visit (HOSPITAL_COMMUNITY)
Admission: RE | Admit: 2024-07-09 | Discharge: 2024-07-09 | Disposition: A | Discharge status: Home / Self Care | Payer: Self-pay | Source: Ambulatory Visit | Attending: Cardiology | Admitting: Cardiology

## 2024-07-09 DIAGNOSIS — E782 Mixed hyperlipidemia: Secondary | ICD-10-CM

## 2024-07-09 DIAGNOSIS — I5020 Unspecified systolic (congestive) heart failure: Secondary | ICD-10-CM

## 2024-07-09 DIAGNOSIS — I2510 Atherosclerotic heart disease of native coronary artery without angina pectoris: Secondary | ICD-10-CM

## 2024-07-09 DIAGNOSIS — I214 Non-ST elevation (NSTEMI) myocardial infarction: Secondary | ICD-10-CM

## 2024-07-09 DIAGNOSIS — I255 Ischemic cardiomyopathy: Secondary | ICD-10-CM

## 2024-07-09 LAB — ECHOCARDIOGRAM COMPLETE
Area-P 1/2: 3.39 cm2
S' Lateral: 3.3 cm

## 2024-07-17 ENCOUNTER — Telehealth: Payer: Self-pay | Admitting: Licensed Clinical Social Worker

## 2024-07-17 NOTE — Telephone Encounter (Signed)
 H&V Care Navigation CSW Progress Note  Clinical Social Worker contacted patient by phone to f/u on patient assistance forms mailed to him. Pt answered phone at [phone], states he hasn't checked his mail in awhile so will check tomorrow when he gets home, no additional questions at this time. Encouraged him to call me and let me know either way, happy to send an additional copy as needed.  Patient is participating in a Managed Medicaid Plan:  No, self pay only  SDOH Screenings   Food Insecurity: No Food Insecurity (07/05/2024)  Housing: Low Risk  (07/05/2024)  Transportation Needs: No Transportation Needs (07/05/2024)  Utilities: Not At Risk (07/05/2024)  Depression (PHQ2-9): Low Risk  (05/24/2023)  Financial Resource Strain: Medium Risk (07/05/2024)  Tobacco Use: Low Risk  (05/31/2024)  Health Literacy: Adequate Health Literacy (07/05/2024)    Marit Lark, MSW, LCSW Clinical Social Worker II [facility] Heart/Vascular Care Navigation  [phone]- work cell phone (preferred)

## 2024-07-23 ENCOUNTER — Telehealth: Payer: Self-pay | Admitting: Licensed Clinical Social Worker

## 2024-07-23 NOTE — Telephone Encounter (Signed)
 H&V Care Navigation CSW Progress Note  Clinical Social Worker contacted patient by phone to f/u on patient assistance forms mailed to him. Pt answered phone at [phone], states he hasn't checked his mail still so will check when he gets home, no additional questions at this time, reminded him of what was sent. Encouraged him again to call me and let me know.   Patient is participating in a Managed Medicaid Plan:  No, self pay only  SDOH Screenings   Food Insecurity: No Food Insecurity (07/05/2024)  Housing: Low Risk  (07/05/2024)  Transportation Needs: No Transportation Needs (07/05/2024)  Utilities: Not At Risk (07/05/2024)  Depression (PHQ2-9): Low Risk  (05/24/2023)  Financial Resource Strain: Medium Risk (07/05/2024)  Tobacco Use: Low Risk  (05/31/2024)  Health Literacy: Adequate Health Literacy (07/05/2024)    Marit Lark, MSW, LCSW Clinical Social Worker II [facility] Heart/Vascular Care Navigation  [phone]- work cell phone (preferred)

## 2024-08-06 ENCOUNTER — Telehealth (HOSPITAL_BASED_OUTPATIENT_CLINIC_OR_DEPARTMENT_OTHER): Payer: Self-pay | Admitting: Licensed Clinical Social Worker

## 2024-08-06 NOTE — Telephone Encounter (Signed)
 H&V Care Navigation CSW Progress Note  Clinical Social Worker contacted patient by phone to f/u on patient assistance forms sent. Reached him at [phone]. Pt shares he reviewed paperwork, thinks he is just going to file for bankruptcy. LCSW offered additional assistance with CAFA/NCMedassist forms for medical bills and medication assistance but pt declines. Offered to send information about credit counseling through [facility] and pt declines that as well. Remain available should pt have any additional questions/concerns.  Patient is participating in a Managed Medicaid Plan:  No, self pay only  SDOH Screenings   Food Insecurity: No Food Insecurity (07/05/2024)  Housing: Low Risk  (07/05/2024)  Transportation Needs: No Transportation Needs (07/05/2024)  Utilities: Not At Risk (07/05/2024)  Depression (PHQ2-9): Low Risk  (05/24/2023)  Financial Resource Strain: Medium Risk (07/05/2024)  Tobacco Use: Low Risk  (05/31/2024)  Health Literacy: Adequate Health Literacy (07/05/2024)    Marit Lark, MSW, LCSW Clinical Social Worker II [facility] Heart/Vascular Care Navigation  [phone]- work cell phone (preferred)

## 2024-08-07 LAB — LIPID PANEL
Chol/HDL Ratio: 2.7 ratio (ref 0.0–5.0)
Cholesterol, Total: 206 mg/dL — ABNORMAL HIGH (ref 100–199)
HDL: 75 mg/dL
LDL Chol Calc (NIH): 121 mg/dL — ABNORMAL HIGH (ref 0–99)
Triglycerides: 57 mg/dL (ref 0–149)
VLDL Cholesterol Cal: 10 mg/dL (ref 5–40)

## 2024-08-07 LAB — HEPATIC FUNCTION PANEL
ALT: 16 IU/L (ref 0–44)
AST: 20 IU/L (ref 0–40)
Albumin: 4.5 g/dL (ref 3.8–4.9)
Alkaline Phosphatase: 94 IU/L (ref 47–123)
Bilirubin Total: 0.5 mg/dL (ref 0.0–1.2)
Bilirubin, Direct: 0.14 mg/dL (ref 0.00–0.40)
Total Protein: 7.5 g/dL (ref 6.0–8.5)

## 2024-08-08 NOTE — Telephone Encounter (Signed)
 Pt read message form Katlyn West, NP via MyChart on 08/07/24

## 2024-08-28 ENCOUNTER — Encounter: Payer: Self-pay | Admitting: Pharmacist

## 2024-08-28 ENCOUNTER — Ambulatory Visit: Payer: Self-pay | Admitting: Pharmacist

## 2024-08-28 DIAGNOSIS — E785 Hyperlipidemia, unspecified: Secondary | ICD-10-CM

## 2024-08-28 NOTE — Patient Instructions (Addendum)
 STOP ezetimibe   START rosuvastatin  10mg  daily (cut the 20mg  tablets in half) If you have issues with the 10mg  daily, you can cut back to every other day  Recheck labs in 2 months

## 2024-08-28 NOTE — Progress Notes (Signed)
 Patient ID: Lee Greer                 DOB: March 21, 1966                    MRN: [id number]      HPI: Lee Greer is a 58 y.o. male patient referred to lipid clinic by Katlyn West. PMH is significant for cannabinoid hyperemesis syndrome, esophageal tear, gastric ulcers, gastroparesis, CAD.   In June 2025 patient presented with nausea and found to have MI.He underwent cardiac catheterization with successful PCI/DES x 2 to the mid circumflex extending into OM1, procedure was complicated by abrupt vessel closure, however good result at the end of the case. He then had a staged intervention PCI/DES to the mid RCA with recommendations for DAPT with aspirin /Effient  for at least 1 year. Patient reported leg cramping on atorvastatin  80mh. He was then tried on rosuvastatin  20mg .  He took 2 doses and then reported muscle pains.  Patient presents today to clinic.  He does not have insurance and does not qualify for Medicaid.  Initially interested in nonstatin options however discussed that these medications are expensive.  We discussed applying for patient assistance but patient did not feel as though he would qualify.  We discussed trying lower doses of statins as side effects are generally dose-dependent.  Patient is interested in this as he has plenty of rosuvastatin  or atorvastatin  at home.    Current Medications: ezetimibe  10mg  Intolerances: rosuvastatin  20mg , atorvastatin  80mg  Risk Factors: CAD status post MI LDL-C goal: <70 ApoB goal:   Diet: Was not discussed in detail-per patient he tries to eat healthy  Exercise: Patient states he exercises every other day  Family History:  Family History  Problem Relation Age of Onset   CAD Mother    Diabetes Neg Hx    Stroke Neg Hx    Colon cancer Neg Hx    Esophageal cancer Neg Hx    Ulcerative colitis Neg Hx    Stomach cancer Neg Hx      Social History:  Social History   Socioeconomic History   Marital status:  Single    Spouse name: Not on file   Number of children: Not on file   Years of education: Not on file   Highest education level: Not on file  Occupational History   Not on file  Tobacco Use   Smoking status: Never   Smokeless tobacco: Never  Vaping Use   Vaping status: Never Used  Substance and Sexual Activity   Alcohol use: Not Currently    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   Drug use: Not Currently    Types: Marijuana    Comment: last used 05/23/22   Sexual activity: Yes  Other Topics Concern   Not on file  Social History Narrative   Not on file   Social Drivers of Health   Tobacco Use: Low Risk (05/31/2024)   Patient History    Smoking Tobacco Use: Never    Smokeless Tobacco Use: Never    Passive Exposure: Not on file  Financial Resource Strain: Medium Risk (07/05/2024)   Overall Financial Resource Strain (CARDIA)    Difficulty of Paying Living Expenses: Somewhat hard  Food Insecurity: No Food Insecurity (07/05/2024)   Epic    Worried About Programme Researcher, Broadcasting/film/video in the Last Year: Never true    Ran Out of Food in the Last Year: Never true  Transportation Needs: No Transportation Needs (07/05/2024)  Epic    Lack of Transportation (Medical): No    Lack of Transportation (Non-Medical): No  Physical Activity: Not on file  Stress: Not on file  Social Connections: Not on file  Intimate Partner Violence: Not At Risk (02/07/2024)   Humiliation, Afraid, Rape, and Kick questionnaire    Fear of Current or Ex-Partner: No    Emotionally Abused: No    Physically Abused: No    Sexually Abused: No  Depression (PHQ2-9): Low Risk (05/24/2023)   Depression (PHQ2-9)    PHQ-2 Score: 0  Alcohol Screen: Not on file  Housing: Low Risk (07/05/2024)   Epic    Unable to Pay for Housing in the Last Year: No    Number of Times Moved in the Last Year: 0    Homeless in the Last Year: No  Utilities: Not At Risk (07/05/2024)   Epic    Threatened with loss of utilities: No  Health  Literacy: Adequate Health Literacy (07/05/2024)   B1300 Health Literacy    Frequency of need for help with medical instructions: Never     Labs: Lipid Panel     Component Value Date/Time   CHOL 206 (H) 08/06/2024 1556   TRIG 57 08/06/2024 1556   HDL 75 08/06/2024 1556   CHOLHDL 2.7 08/06/2024 1556   CHOLHDL 2.5 02/07/2024 0726   VLDL 15 02/07/2024 0726   LDLCALC 121 (H) 08/06/2024 1556   LABVLDL 10 08/06/2024 1556    Past Medical History:  Diagnosis Date   Acute kidney injury 04/13/2017   related to dehydration (04/14/2017)   Blood transfusion without reported diagnosis    Cannabinoid hyperemesis syndrome    Esophageal tear    Gastric ulcer    Gastritis with intestinal metaplasia of stomach    Gastroparesis    H. pylori infection    Hypokalemia    Malnutrition of moderate degree (Gomez: 60% to less than 75% of standard weight)    Posthemorrhagic anemia    Upper GI bleed     Medications Ordered Prior to Encounter[1]  Allergies[2]  Assessment/Plan:  1. Hyperlipidemia -  Hyperlipidemia Assessment: LDL cholesterol of 121 is above goal of less than 70 Patient currently on ezetimibe  but feels as though he causes him pain in his hips and down his leg.  States it is tolerable We discussed nonstatin options but these are potentially cost prohibitive Discussed trying statins again at lower doses which patient is agreeable to Exercises every other day with weight training  Plan: Start rosuvastatin  10 mg daily.  Patient will cut the 20 mg tablets he already has in half.  If this causes him pain then he will go to 10 mg every other day Stop ezetimibe  Repeat labs in 2 months    Thank you,  Harel Repetto D Ayleen Mckinstry, Pharm.JONETTA SARAN, CPP [facility] [address], [place], [place] [address]  Phone: [phone]; Fax: [phone]           [1]  Current Outpatient Medications on File Prior to Visit  Medication  Sig Dispense Refill   aspirin  EC 81 MG tablet Take 1 tablet (81 mg total) by mouth daily. Swallow whole. 90 tablet 2   Multiple Vitamin (MULTIVITAMIN) tablet Take 1 tablet by mouth daily with breakfast.     nitroGLYCERIN  (NITROSTAT ) 0.4 MG SL tablet Place 1 tablet (0.4 mg total) under the tongue every 5 (five) minutes as needed. 25 tablet 2   pantoprazole  (PROTONIX ) 40 MG  tablet Take 1 tablet (40 mg total) by mouth daily. (Patient not taking: Reported on 05/31/2024) 90 tablet 3   prasugrel  (EFFIENT ) 10 MG TABS tablet Take 1 tablet (10 mg total) by mouth daily. 90 tablet 3   rosuvastatin  (CRESTOR ) 20 MG tablet Take 0.5 tablets (10 mg total) by mouth daily.     No current facility-administered medications on file prior to visit.  [2] No Known Allergies

## 2024-08-28 NOTE — Assessment & Plan Note (Signed)
 Assessment: LDL cholesterol of 121 is above goal of less than 70 Patient currently on ezetimibe  but feels as though he causes him pain in his hips and down his leg.  States it is tolerable We discussed nonstatin options but these are potentially cost prohibitive Discussed trying statins again at lower doses which patient is agreeable to Exercises every other day with weight training  Plan: Start rosuvastatin  10 mg daily.  Patient will cut the 20 mg tablets he already has in half.  If this causes him pain then he will go to 10 mg every other day Stop ezetimibe  Repeat labs in 2 months
